# Patient Record
Sex: Male | Born: 1945 | Race: White | Hispanic: No | Marital: Married | Smoking: Former smoker
Health system: Southern US, Community
[De-identification: ages and names within clinical notes are randomized; demographics above are authoritative.]

## PROBLEM LIST (undated history)

## (undated) DIAGNOSIS — M48061 Spinal stenosis, lumbar region without neurogenic claudication: Secondary | ICD-10-CM

## (undated) DIAGNOSIS — R0600 Dyspnea, unspecified: Secondary | ICD-10-CM

## (undated) DIAGNOSIS — J449 Chronic obstructive pulmonary disease, unspecified: Secondary | ICD-10-CM

## (undated) DIAGNOSIS — M109 Gout, unspecified: Secondary | ICD-10-CM

## (undated) DIAGNOSIS — G4733 Obstructive sleep apnea (adult) (pediatric): Secondary | ICD-10-CM

## (undated) DIAGNOSIS — J439 Emphysema, unspecified: Secondary | ICD-10-CM

## (undated) DIAGNOSIS — I2510 Atherosclerotic heart disease of native coronary artery without angina pectoris: Secondary | ICD-10-CM

## (undated) DIAGNOSIS — Z8709 Personal history of other diseases of the respiratory system: Secondary | ICD-10-CM

## (undated) DIAGNOSIS — Z950 Presence of cardiac pacemaker: Secondary | ICD-10-CM

## (undated) DIAGNOSIS — M1990 Unspecified osteoarthritis, unspecified site: Secondary | ICD-10-CM

## (undated) DIAGNOSIS — I499 Cardiac arrhythmia, unspecified: Secondary | ICD-10-CM

## (undated) DIAGNOSIS — K5790 Diverticulosis of intestine, part unspecified, without perforation or abscess without bleeding: Secondary | ICD-10-CM

## (undated) DIAGNOSIS — R7303 Prediabetes: Secondary | ICD-10-CM

## (undated) DIAGNOSIS — I714 Abdominal aortic aneurysm, without rupture, unspecified: Secondary | ICD-10-CM

## (undated) DIAGNOSIS — I509 Heart failure, unspecified: Secondary | ICD-10-CM

## (undated) DIAGNOSIS — I219 Acute myocardial infarction, unspecified: Secondary | ICD-10-CM

## (undated) DIAGNOSIS — E785 Hyperlipidemia, unspecified: Secondary | ICD-10-CM

## (undated) DIAGNOSIS — I10 Essential (primary) hypertension: Secondary | ICD-10-CM

## (undated) DIAGNOSIS — R600 Localized edema: Secondary | ICD-10-CM

## (undated) DIAGNOSIS — N62 Hypertrophy of breast: Secondary | ICD-10-CM

## (undated) HISTORY — DX: Gout, unspecified: M10.9

## (undated) HISTORY — DX: Hypertrophy of breast: N62

## (undated) HISTORY — DX: Atherosclerotic heart disease of native coronary artery without angina pectoris: I25.10

## (undated) HISTORY — DX: Essential (primary) hypertension: I10

## (undated) HISTORY — DX: Emphysema, unspecified: J43.9

## (undated) HISTORY — DX: Diverticulosis of intestine, part unspecified, without perforation or abscess without bleeding: K57.90

## (undated) HISTORY — DX: Hyperlipidemia, unspecified: E78.5

## (undated) HISTORY — DX: Abdominal aortic aneurysm, without rupture, unspecified: I71.40

## (undated) HISTORY — DX: Spinal stenosis, lumbar region without neurogenic claudication: M48.061

## (undated) HISTORY — PX: ABDOMINAL AORTIC ANEURYSM REPAIR: SUR1152

## (undated) HISTORY — DX: Localized edema: R60.0

## (undated) HISTORY — DX: Abdominal aortic aneurysm, without rupture: I71.4

## (undated) HISTORY — DX: Obstructive sleep apnea (adult) (pediatric): G47.33

## (undated) HISTORY — PX: NECK SURGERY: SHX720

## (undated) HISTORY — PX: BACK SURGERY: SHX140

## (undated) HISTORY — DX: Personal history of other diseases of the respiratory system: Z87.09

---

## 1974-02-15 HISTORY — PX: VASECTOMY: SHX75

## 1989-02-15 HISTORY — PX: NECK SURGERY: SHX720

## 1990-02-15 HISTORY — PX: BACK SURGERY: SHX140

## 2009-02-15 HISTORY — PX: ELBOW ARTHROSCOPY: SUR87

## 2016-04-15 HISTORY — PX: CARDIAC CATHETERIZATION: SHX172

## 2016-12-09 DIAGNOSIS — I714 Abdominal aortic aneurysm, without rupture: Secondary | ICD-10-CM | POA: Diagnosis not present

## 2016-12-09 DIAGNOSIS — Z23 Encounter for immunization: Secondary | ICD-10-CM | POA: Diagnosis not present

## 2016-12-09 DIAGNOSIS — E782 Mixed hyperlipidemia: Secondary | ICD-10-CM | POA: Diagnosis not present

## 2016-12-09 DIAGNOSIS — R600 Localized edema: Secondary | ICD-10-CM | POA: Diagnosis not present

## 2016-12-09 DIAGNOSIS — I10 Essential (primary) hypertension: Secondary | ICD-10-CM | POA: Diagnosis not present

## 2016-12-09 DIAGNOSIS — G4733 Obstructive sleep apnea (adult) (pediatric): Secondary | ICD-10-CM | POA: Diagnosis not present

## 2016-12-09 DIAGNOSIS — I2510 Atherosclerotic heart disease of native coronary artery without angina pectoris: Secondary | ICD-10-CM | POA: Diagnosis not present

## 2016-12-15 DIAGNOSIS — S81811A Laceration without foreign body, right lower leg, initial encounter: Secondary | ICD-10-CM | POA: Diagnosis not present

## 2017-01-18 DIAGNOSIS — S81801A Unspecified open wound, right lower leg, initial encounter: Secondary | ICD-10-CM | POA: Diagnosis not present

## 2017-02-01 DIAGNOSIS — I10 Essential (primary) hypertension: Secondary | ICD-10-CM | POA: Diagnosis not present

## 2017-02-01 DIAGNOSIS — I714 Abdominal aortic aneurysm, without rupture: Secondary | ICD-10-CM | POA: Diagnosis not present

## 2017-02-01 DIAGNOSIS — I2510 Atherosclerotic heart disease of native coronary artery without angina pectoris: Secondary | ICD-10-CM | POA: Diagnosis not present

## 2017-02-01 DIAGNOSIS — E782 Mixed hyperlipidemia: Secondary | ICD-10-CM | POA: Diagnosis not present

## 2017-02-01 DIAGNOSIS — Z0000 Encounter for general adult medical examination without abnormal findings: Secondary | ICD-10-CM | POA: Diagnosis not present

## 2017-02-01 DIAGNOSIS — S81801A Unspecified open wound, right lower leg, initial encounter: Secondary | ICD-10-CM | POA: Diagnosis not present

## 2017-02-01 DIAGNOSIS — H6122 Impacted cerumen, left ear: Secondary | ICD-10-CM | POA: Diagnosis not present

## 2017-02-01 DIAGNOSIS — G629 Polyneuropathy, unspecified: Secondary | ICD-10-CM | POA: Diagnosis not present

## 2017-02-01 DIAGNOSIS — G4733 Obstructive sleep apnea (adult) (pediatric): Secondary | ICD-10-CM | POA: Diagnosis not present

## 2017-02-17 DIAGNOSIS — I714 Abdominal aortic aneurysm, without rupture: Secondary | ICD-10-CM | POA: Diagnosis not present

## 2017-02-17 DIAGNOSIS — G4733 Obstructive sleep apnea (adult) (pediatric): Secondary | ICD-10-CM | POA: Diagnosis not present

## 2017-02-17 DIAGNOSIS — E782 Mixed hyperlipidemia: Secondary | ICD-10-CM | POA: Diagnosis not present

## 2017-02-17 DIAGNOSIS — I10 Essential (primary) hypertension: Secondary | ICD-10-CM | POA: Diagnosis not present

## 2017-02-17 DIAGNOSIS — H6122 Impacted cerumen, left ear: Secondary | ICD-10-CM | POA: Diagnosis not present

## 2017-02-17 DIAGNOSIS — I2510 Atherosclerotic heart disease of native coronary artery without angina pectoris: Secondary | ICD-10-CM | POA: Diagnosis not present

## 2017-03-22 DIAGNOSIS — G4733 Obstructive sleep apnea (adult) (pediatric): Secondary | ICD-10-CM | POA: Diagnosis not present

## 2017-03-31 DIAGNOSIS — H2523 Age-related cataract, morgagnian type, bilateral: Secondary | ICD-10-CM | POA: Diagnosis not present

## 2017-04-01 DIAGNOSIS — Z0101 Encounter for examination of eyes and vision with abnormal findings: Secondary | ICD-10-CM | POA: Diagnosis not present

## 2017-04-25 DIAGNOSIS — G4733 Obstructive sleep apnea (adult) (pediatric): Secondary | ICD-10-CM | POA: Diagnosis not present

## 2017-05-04 DIAGNOSIS — G4733 Obstructive sleep apnea (adult) (pediatric): Secondary | ICD-10-CM | POA: Diagnosis not present

## 2017-05-16 ENCOUNTER — Encounter: Payer: Self-pay | Admitting: Vascular Surgery

## 2017-06-04 DIAGNOSIS — G4733 Obstructive sleep apnea (adult) (pediatric): Secondary | ICD-10-CM | POA: Diagnosis not present

## 2017-06-09 DIAGNOSIS — G4733 Obstructive sleep apnea (adult) (pediatric): Secondary | ICD-10-CM | POA: Diagnosis not present

## 2017-06-28 ENCOUNTER — Other Ambulatory Visit: Payer: Self-pay

## 2017-06-28 ENCOUNTER — Ambulatory Visit: Payer: Medicare HMO | Admitting: Vascular Surgery

## 2017-06-28 ENCOUNTER — Encounter: Payer: Self-pay | Admitting: Vascular Surgery

## 2017-06-28 VITALS — BP 155/94 | HR 72 | Temp 98.7°F | Resp 20 | Ht 67.0 in | Wt 238.0 lb

## 2017-06-28 DIAGNOSIS — I71019 Dissection of thoracic aorta, unspecified: Secondary | ICD-10-CM

## 2017-06-28 DIAGNOSIS — I714 Abdominal aortic aneurysm, without rupture, unspecified: Secondary | ICD-10-CM

## 2017-06-28 DIAGNOSIS — I7101 Dissection of thoracic aorta: Secondary | ICD-10-CM

## 2017-06-28 NOTE — Progress Notes (Signed)
Vascular and Vein Specialist of [facility]  Patient name: Jonathan Garner MRN: [id number] DOB: [birth year] Sex: male  REASON FOR CONSULT: Follow-up of infrarenal aortic aneurysm stent graft repair and known thoracic aneurysm.  HPI: Jonathan Garner is a 72 y.o. male, who is patient presents today for follow-up.  He recently moved to [facility] from his entire life in [place].  He is here today with his wife.  I do not have records of his procedure but he had a prior stent graft repair of infrarenal abdominal aortic aneurysm in 2014 by his history.  He had a subsequent repair for an endoleak in 2018.  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  I have copies of his CT scan report of his chest abdomen and pelvis from this event.  He has had no CT scan since March 2018.  He is in good health.  Does remain active.  Does not have any symptoms of his thoracic dissection.  Past Medical History:  Diagnosis Date  . AAA (abdominal aortic aneurysm) ([place])   . CAD (coronary artery disease)   . Diverticulosis   . Gynecomastia   . Hx of emphysema   . Hyperlipidemia   . Hypertension   . Lumbar spinal stenosis   . OSA (obstructive sleep apnea)   . Pedal edema     Family History  Problem Relation Age of Onset  . Alzheimer's disease Mother   . Heart attack Father   . Leukemia Father   . Hypertension Father     SOCIAL HISTORY: Social History   Socioeconomic History  . Marital status: Married    Spouse name: Not on file  . Number of children: Not on file  . Years of education: Not on file  . Highest education level: Not on file  Occupational History  . Not on file  Social Needs  . Financial resource strain: Not on file  . Food insecurity:    Worry: Not on file    Inability: Not on file  . Transportation needs:    Medical: Not on file    Non-medical: Not on  file  Tobacco Use  . Smoking status: Current Some Day Smoker    Types: Cigars  . Smokeless tobacco: Never Used  Substance and Sexual Activity  . Alcohol use: Yes    Comment: occasionally  . Drug use: Not on file  . Sexual activity: Not on file  Lifestyle  . Physical activity:    Days per week: Not on file    Minutes per session: Not on file  . Stress: Not on file  Relationships  . Social connections:    Talks on phone: Not on file    Gets together: Not on file    Attends religious service: Not on file    Active member of club or organization: Not on file    Attends meetings of clubs or organizations: Not on file    Relationship status: Not on file  . Intimate partner violence:    Fear of current or ex partner: Not on file    Emotionally abused: Not on file    Physically abused: Not on file    Forced sexual activity: Not on file  Other Topics Concern  . Not on file  Social History Narrative  . Not on file    No Known Allergies  Current Outpatient Medications  Medication Sig Dispense Refill  . clopidogrel (PLAVIX) 75 MG tablet Take 75 mg by mouth daily.    Marland Kitchen labetalol (NORMODYNE) 200 MG tablet Take 200 mg by mouth 2 (two) times daily.     No current facility-administered medications for this visit.     REVIEW OF SYSTEMS:  [X]  denotes positive finding, [ ]  denotes negative finding Cardiac  Comments:  Chest pain or chest pressure:    Shortness of breath upon exertion: x   Short of breath when lying flat:    Irregular heart rhythm:        Vascular    Pain in calf, thigh, or hip brought on by ambulation: x   Pain in feet at night that wakes you up from your sleep:     Blood clot in your veins:    Leg swelling:  x       Pulmonary    Oxygen at home:    Productive cough:     Wheezing:         Neurologic    Sudden weakness in arms or legs:     Sudden numbness in arms or legs:     Sudden onset of difficulty speaking or slurred speech:    Temporary loss of  vision in one eye:     Problems with dizziness:         Gastrointestinal    Blood in stool:     Vomited blood:         Genitourinary    Burning when urinating:     Blood in urine:        Psychiatric    Major depression:         Hematologic    Bleeding problems:    Problems with blood clotting too easily:        Skin    Rashes or ulcers:        Constitutional    Fever or chills:      PHYSICAL EXAM: Vitals:   06/28/17 1116  BP: (!) 155/94  Pulse: 72  Resp: 20  Temp: 98.7 F (37.1 C)  TempSrc: Oral  SpO2: 95%  Weight: 238 lb (108 kg)  Height: 5\' 7"  (1.702 m)    GENERAL: The patient is a well-nourished male, in no acute distress. The vital signs are documented above. CARDIOVASCULAR: Palpable radial and palpable femoral pulses bilaterally.  He does have easily palpable popliteal and dorsalis pedis pulses.  Carotid arteries without bruits PULMONARY: There is good air exchange  ABDOMEN: Soft and non-tender.  No bruits noted MUSCULOSKELETAL: There are no major deformities or cyanosis. NEUROLOGIC: No focal weakness or paresthesias are detected. SKIN: There are no ulcers or rashes noted. PSYCHIATRIC: The patient has a normal affect.  DATA:  CT scan report from March 2018 showed thoracic dissection beginning just distal to the left subclavian takeoff and extending down to the celiac artery with some compression of the celiac artery.  Maximal dilatation of the thoracic aorta was 4.2 cm.  He did have a infrarenal stent graft repair of infrarenal aneurysm with maximal diameter amp of the aneurysm sac of 6 cm.  MEDICAL ISSUES: Had a long discussion with patient regarding both the thoracic dissection and the abdominal aortic aneurysm and recommended follow-up for each of these.  Is been 14 months since his last CT scan I would obtain a CT of his chest abdomen and pelvis for further visualization.  We will contact him following these imaging studies.  Assuming there  are no  concerns we will see him then in 1 year with repeat CT scanning.   Rosetta Posner, MD FACS Vascular and Vein Specialists of [facility] Tel [phone] Pager [phone]

## 2017-06-29 ENCOUNTER — Other Ambulatory Visit: Payer: Self-pay

## 2017-06-29 DIAGNOSIS — I714 Abdominal aortic aneurysm, without rupture, unspecified: Secondary | ICD-10-CM

## 2017-06-29 DIAGNOSIS — I71019 Dissection of thoracic aorta, unspecified: Secondary | ICD-10-CM

## 2017-06-29 DIAGNOSIS — I7101 Dissection of thoracic aorta: Secondary | ICD-10-CM

## 2017-07-04 DIAGNOSIS — G4733 Obstructive sleep apnea (adult) (pediatric): Secondary | ICD-10-CM | POA: Diagnosis not present

## 2017-07-06 DIAGNOSIS — R0602 Shortness of breath: Secondary | ICD-10-CM | POA: Diagnosis not present

## 2017-07-06 DIAGNOSIS — Z7902 Long term (current) use of antithrombotics/antiplatelets: Secondary | ICD-10-CM | POA: Diagnosis not present

## 2017-07-06 DIAGNOSIS — R7989 Other specified abnormal findings of blood chemistry: Secondary | ICD-10-CM | POA: Diagnosis not present

## 2017-07-06 DIAGNOSIS — R0902 Hypoxemia: Secondary | ICD-10-CM | POA: Diagnosis not present

## 2017-07-06 DIAGNOSIS — I110 Hypertensive heart disease with heart failure: Secondary | ICD-10-CM | POA: Diagnosis not present

## 2017-07-06 DIAGNOSIS — I509 Heart failure, unspecified: Secondary | ICD-10-CM | POA: Diagnosis not present

## 2017-07-06 DIAGNOSIS — I5031 Acute diastolic (congestive) heart failure: Secondary | ICD-10-CM | POA: Diagnosis not present

## 2017-07-06 DIAGNOSIS — I712 Thoracic aortic aneurysm, without rupture: Secondary | ICD-10-CM | POA: Diagnosis not present

## 2017-07-06 DIAGNOSIS — G4733 Obstructive sleep apnea (adult) (pediatric): Secondary | ICD-10-CM | POA: Diagnosis not present

## 2017-07-06 DIAGNOSIS — Z79899 Other long term (current) drug therapy: Secondary | ICD-10-CM | POA: Diagnosis not present

## 2017-07-06 DIAGNOSIS — R0682 Tachypnea, not elsewhere classified: Secondary | ICD-10-CM | POA: Diagnosis not present

## 2017-07-06 DIAGNOSIS — J9601 Acute respiratory failure with hypoxia: Secondary | ICD-10-CM | POA: Diagnosis not present

## 2017-07-06 DIAGNOSIS — R69 Illness, unspecified: Secondary | ICD-10-CM | POA: Diagnosis not present

## 2017-07-06 DIAGNOSIS — R918 Other nonspecific abnormal finding of lung field: Secondary | ICD-10-CM | POA: Diagnosis not present

## 2017-07-06 DIAGNOSIS — I10 Essential (primary) hypertension: Secondary | ICD-10-CM | POA: Diagnosis not present

## 2017-07-06 DIAGNOSIS — R0989 Other specified symptoms and signs involving the circulatory and respiratory systems: Secondary | ICD-10-CM | POA: Diagnosis not present

## 2017-07-06 DIAGNOSIS — I5030 Unspecified diastolic (congestive) heart failure: Secondary | ICD-10-CM | POA: Diagnosis not present

## 2017-07-22 ENCOUNTER — Encounter (HOSPITAL_COMMUNITY): Payer: Self-pay

## 2017-07-22 ENCOUNTER — Ambulatory Visit (HOSPITAL_COMMUNITY)
Admission: RE | Admit: 2017-07-22 | Discharge: 2017-07-22 | Disposition: A | Discharge status: Home / Self Care | Payer: Medicare HMO | Source: Ambulatory Visit | Attending: Vascular Surgery | Admitting: Vascular Surgery

## 2017-07-22 DIAGNOSIS — I7101 Dissection of thoracic aorta: Secondary | ICD-10-CM | POA: Diagnosis present

## 2017-07-22 DIAGNOSIS — I779 Disorder of arteries and arterioles, unspecified: Secondary | ICD-10-CM | POA: Diagnosis not present

## 2017-07-22 DIAGNOSIS — I714 Abdominal aortic aneurysm, without rupture, unspecified: Secondary | ICD-10-CM

## 2017-07-22 DIAGNOSIS — M5136 Other intervertebral disc degeneration, lumbar region: Secondary | ICD-10-CM | POA: Insufficient documentation

## 2017-07-22 DIAGNOSIS — K5730 Diverticulosis of large intestine without perforation or abscess without bleeding: Secondary | ICD-10-CM | POA: Insufficient documentation

## 2017-07-22 DIAGNOSIS — I712 Thoracic aortic aneurysm, without rupture: Secondary | ICD-10-CM | POA: Diagnosis not present

## 2017-07-22 DIAGNOSIS — I723 Aneurysm of iliac artery: Secondary | ICD-10-CM | POA: Insufficient documentation

## 2017-07-22 DIAGNOSIS — I71019 Dissection of thoracic aorta, unspecified: Secondary | ICD-10-CM

## 2017-07-22 DIAGNOSIS — I700 Atherosclerosis of aorta: Secondary | ICD-10-CM | POA: Insufficient documentation

## 2017-07-22 LAB — POCT I-STAT CREATININE: Creatinine, Ser: 1 mg/dL (ref 0.61–1.24)

## 2017-07-22 MED ORDER — IOPAMIDOL (ISOVUE-370) INJECTION 76%
100.0000 mL | Freq: Once | INTRAVENOUS | Status: AC | PRN
  Administered 2017-07-22: 100 mL via INTRAVENOUS

## 2017-07-27 DIAGNOSIS — D171 Benign lipomatous neoplasm of skin and subcutaneous tissue of trunk: Secondary | ICD-10-CM | POA: Diagnosis not present

## 2017-07-27 DIAGNOSIS — I2510 Atherosclerotic heart disease of native coronary artery without angina pectoris: Secondary | ICD-10-CM | POA: Diagnosis not present

## 2017-07-27 DIAGNOSIS — M71329 Other bursal cyst, unspecified elbow: Secondary | ICD-10-CM | POA: Diagnosis not present

## 2017-07-27 DIAGNOSIS — E782 Mixed hyperlipidemia: Secondary | ICD-10-CM | POA: Diagnosis not present

## 2017-07-27 DIAGNOSIS — Z09 Encounter for follow-up examination after completed treatment for conditions other than malignant neoplasm: Secondary | ICD-10-CM | POA: Diagnosis not present

## 2017-07-27 DIAGNOSIS — G4733 Obstructive sleep apnea (adult) (pediatric): Secondary | ICD-10-CM | POA: Diagnosis not present

## 2017-07-27 DIAGNOSIS — I10 Essential (primary) hypertension: Secondary | ICD-10-CM | POA: Diagnosis not present

## 2017-07-27 DIAGNOSIS — I5031 Acute diastolic (congestive) heart failure: Secondary | ICD-10-CM | POA: Diagnosis not present

## 2017-07-27 DIAGNOSIS — I714 Abdominal aortic aneurysm, without rupture: Secondary | ICD-10-CM | POA: Diagnosis not present

## 2017-08-01 DIAGNOSIS — G4733 Obstructive sleep apnea (adult) (pediatric): Secondary | ICD-10-CM | POA: Diagnosis not present

## 2017-08-01 DIAGNOSIS — Z720 Tobacco use: Secondary | ICD-10-CM | POA: Diagnosis not present

## 2017-08-02 ENCOUNTER — Ambulatory Visit: Payer: Medicare HMO | Admitting: Vascular Surgery

## 2017-08-02 ENCOUNTER — Other Ambulatory Visit: Payer: Self-pay

## 2017-08-02 ENCOUNTER — Encounter: Payer: Self-pay | Admitting: Vascular Surgery

## 2017-08-02 VITALS — BP 110/64 | HR 82 | Temp 98.2°F | Resp 20 | Ht 67.0 in | Wt 230.0 lb

## 2017-08-02 DIAGNOSIS — I714 Abdominal aortic aneurysm, without rupture, unspecified: Secondary | ICD-10-CM

## 2017-08-02 DIAGNOSIS — I7101 Dissection of thoracic aorta: Secondary | ICD-10-CM

## 2017-08-02 DIAGNOSIS — I71019 Dissection of thoracic aorta, unspecified: Secondary | ICD-10-CM

## 2017-08-02 NOTE — Progress Notes (Signed)
Vascular and Vein Specialist of [facility]  Patient name: Jonathan Garner MRN: [id number] DOB: 06-Apr-1945 Sex: male  REASON FOR VISIT: Discussion of CT scan chest abdomen and pelvis  HPI: Jonathan Garner is a 72 y.o. male here today for follow-up.  I had seen him in the office has an initial evaluation on 06/28/2017.  He had moved here from [place] and was establishing care.  Had prior history of infrarenal stent graft repair of abdominal aortic aneurysm and also history of type B descending thoracic dissection.  I recommended to CT scan for ongoing follow-up.  He has no new concerns since my visit with him 1 month ago  Past Medical History:  Diagnosis Date  . AAA (abdominal aortic aneurysm) ([place])   . CAD (coronary artery disease)   . Diverticulosis   . Gynecomastia   . Hx of emphysema   . Hyperlipidemia   . Hypertension   . Lumbar spinal stenosis   . OSA (obstructive sleep apnea)   . Pedal edema     Family History  Problem Relation Age of Onset  . Alzheimer's disease Mother   . Heart attack Father   . Leukemia Father   . Hypertension Father     SOCIAL HISTORY: Social History   Tobacco Use  . Smoking status: Current Some Day Smoker    Types: Cigars  . Smokeless tobacco: Never Used  Substance Use Topics  . Alcohol use: Yes    Comment: occasionally    No Known Allergies  Current Outpatient Medications  Medication Sig Dispense Refill  . aspirin EC 81 MG tablet Take 81 mg by mouth daily.    . clopidogrel (PLAVIX) 75 MG tablet Take 75 mg by mouth daily.    . furosemide (LASIX) 20 MG tablet Take 20 mg by mouth daily.  0  . labetalol (NORMODYNE) 200 MG tablet Take 200 mg by mouth 2 (two) times daily.     No current facility-administered medications for this visit.     REVIEW OF SYSTEMS:  [X]  denotes positive finding, [ ]  denotes negative finding Cardiac  Comments:  Chest pain or chest pressure:    Shortness of breath upon  exertion:    Short of breath when lying flat:    Irregular heart rhythm:        Vascular    Pain in calf, thigh, or hip brought on by ambulation:    Pain in feet at night that wakes you up from your sleep:     Blood clot in your veins:    Leg swelling:           PHYSICAL EXAM: Vitals:   08/02/17 1148  BP: 110/64  Pulse: 82  Resp: 20  Temp: 98.2 F (36.8 C)  TempSrc: Oral  SpO2: 97%  Weight: 230 lb (104.3 kg)  Height: 5\' 7"  (1.702 m)    GENERAL: The patient is a well-nourished male, in no acute distress. The vital signs are documented above.  DATA:  I reviewed his CT scan and discussed this at length with the patient.  There are multiple issues regarding this.  He has had marked enlargement in his thoracic aorta.  In 2018 report states that this was 4.2 cm and is currently 5.4 cm.  It does begin distal to the left subclavian takeoff but extends down to the celiac artery.  He does have a stent in his celiac artery that extends into his aorta.  He does not have any evidence of endoleak in  his infrarenal stent graft.  The maximal diameter of his native aortic sac is 5.9 cm and it was described as 6.0 cm in his study in 2018.  Of some concern is that his left iliac limb of his stent graft is just inside the iliac artery past the aortic bifurcation.  This needs to be watched closely but currently does not have any evidence of endoleak associated with this  MEDICAL ISSUES: I had a very long discussion with the patient regarding this explaining that he has had rapid growth of his thoracic aneurysm and I do not feel comfortable with observation only regarding this.  I explained the concern regarding the endpoint at the celiac artery.  Explained that I would review his films with several partners to establish a treatment plan.  Also explained that these sometimes require complex open hybrid repair and also explained the potential for referral to [facility] to Dr. Vinnie Level who  is a regional expert on these complex problems.  We will discuss this further with him following review    Rosetta Posner, MD [facility] Vascular and Vein Specialists of [facility] Tel [phone] Pager [phone]

## 2017-08-04 ENCOUNTER — Encounter: Payer: Self-pay | Admitting: Cardiovascular Disease

## 2017-08-04 ENCOUNTER — Ambulatory Visit: Payer: Medicare HMO | Admitting: Cardiovascular Disease

## 2017-08-04 ENCOUNTER — Encounter: Payer: Self-pay | Admitting: *Deleted

## 2017-08-04 VITALS — BP 116/70 | HR 64 | Ht 67.0 in | Wt 232.0 lb

## 2017-08-04 DIAGNOSIS — I25118 Atherosclerotic heart disease of native coronary artery with other forms of angina pectoris: Secondary | ICD-10-CM

## 2017-08-04 DIAGNOSIS — I714 Abdominal aortic aneurysm, without rupture, unspecified: Secondary | ICD-10-CM

## 2017-08-04 DIAGNOSIS — I5032 Chronic diastolic (congestive) heart failure: Secondary | ICD-10-CM | POA: Diagnosis not present

## 2017-08-04 DIAGNOSIS — I7101 Dissection of thoracic aorta: Secondary | ICD-10-CM | POA: Diagnosis not present

## 2017-08-04 DIAGNOSIS — I10 Essential (primary) hypertension: Secondary | ICD-10-CM | POA: Insufficient documentation

## 2017-08-04 DIAGNOSIS — E785 Hyperlipidemia, unspecified: Secondary | ICD-10-CM | POA: Diagnosis not present

## 2017-08-04 DIAGNOSIS — G4733 Obstructive sleep apnea (adult) (pediatric): Secondary | ICD-10-CM | POA: Diagnosis not present

## 2017-08-04 DIAGNOSIS — Z955 Presence of coronary angioplasty implant and graft: Secondary | ICD-10-CM | POA: Diagnosis not present

## 2017-08-04 DIAGNOSIS — Z9289 Personal history of other medical treatment: Secondary | ICD-10-CM

## 2017-08-04 DIAGNOSIS — I71012 Dissection of descending thoracic aorta: Secondary | ICD-10-CM

## 2017-08-04 NOTE — Patient Instructions (Signed)
Your physician recommends that you schedule a follow-up appointment in: 10 weeks in the [facility] with [place]    Your physician recommends that you continue on your current medications as directed. Please refer to the Current Medication list given to you today.    If you need a refill on your cardiac medications before your next appointment, please call your pharmacy.     No lab work or tests ordered today.     Thank you for choosing [facility] !

## 2017-08-04 NOTE — Progress Notes (Addendum)
CARDIOLOGY CONSULT NOTE  Patient ID: Jonathan Garner MRN: [id number] DOB/AGE: 73-Aug-1947 73 y.o.  Admit date: (Not on file) Primary Physician: Glenford Bayley, DO Referring Physician: Glenford Bayley, DO  Reason for Consultation: Coronary artery disease and CHF  HPI: Jonathan Garner is a 72 y.o. male who is being seen today for the evaluation of coronary artery disease and CHF at the request of Le, Thao P, DO.   I reviewed extensive records.  He reportedly has a history of 3 coronary artery stents placed in [place], [place] in March 2018.  He also reportedly has dyslipidemia, hypertension, and chronic diastolic heart failure.  He has a history of sleep apnea and uses CPAP.  I reviewed labs performed on 07/29/2017: Sodium 140, potassium 4.6, BUN 20, creatinine 0.93, normal liver transaminases, white blood cell 7.3, hemoglobin 13.7, platelets 262.  Past medical history also includes infrarenal stent graft repair of an abdominal aortic aneurysm and also a history of type B descending thoracic dissection.  He recently established with vascular surgery.  He moved here from [facility] where he was born and lived for 35 years.  He now lives in [place].  His wife is also my patient.  He was reportedly hospitalized at [facility] for CHF between May 22 May 24th of this year.  I will have to request records.  He is uncertain about what cardiac testing was performed including echocardiograms and such.  He quit smoking a week ago.  He smoked up to a pack of cigarettes daily for over 50 years.  He denies exertional chest pain.  Gets short of breath if he overexerts himself.  He said overall he feels tired, weak, and achy.  He denies orthopnea, leg swelling, palpitations, and paroxysmal nocturnal dyspnea.     No Known Allergies  Current Outpatient Medications  Medication Sig Dispense Refill  . aspirin EC 81 MG tablet Take 81 mg by mouth daily.    Marland Kitchen atorvastatin (LIPITOR) 10 MG tablet Take 10  mg by mouth every evening.  3  . clopidogrel (PLAVIX) 75 MG tablet Take 75 mg by mouth daily.    . furosemide (LASIX) 20 MG tablet Take 20 mg by mouth daily.  0  . labetalol (NORMODYNE) 200 MG tablet Take 200 mg by mouth 2 (two) times daily.    Marland Kitchen losartan (COZAAR) 50 MG tablet Take 1 tablet by mouth daily.  0   No current facility-administered medications for this visit.     Past Medical History:  Diagnosis Date  . AAA (abdominal aortic aneurysm) ([place])   . CAD (coronary artery disease)   . Diverticulosis   . Gynecomastia   . Hx of emphysema   . Hyperlipidemia   . Hypertension   . Lumbar spinal stenosis   . OSA (obstructive sleep apnea)   . Pedal edema     Past Surgical History:  Procedure Laterality Date  . ABDOMINAL AORTIC ANEURYSM REPAIR  2013 and 2018  . BACK SURGERY    . CARDIAC CATHETERIZATION  04/2016   [place]  . ELBOW ARTHROSCOPY Left 2011  . NECK SURGERY    . VASECTOMY  1976    Social History   Socioeconomic History  . Marital status: Married    Spouse name: Not on file  . Number of children: Not on file  . Years of education: Not on file  . Highest education level: Not on file  Occupational History  . Not on file  Social Needs  . Financial resource strain: Not on file  . Food insecurity:    Worry: Not on file    Inability: Not on file  . Transportation needs:    Medical: Not on file    Non-medical: Not on file  Tobacco Use  . Smoking status: Former Smoker    Types: Cigars    Last attempt to quit: 07/28/2017    Years since quitting: 0.0  . Smokeless tobacco: Never Used  Substance and Sexual Activity  . Alcohol use: Yes    Comment: occasionally  . Drug use: Not on file  . Sexual activity: Not on file  Lifestyle  . Physical activity:    Days per week: Not on file    Minutes per session: Not on file  . Stress: Not on file  Relationships  . Social connections:    Talks on phone: Not on file    Gets together: Not on file    Attends  religious service: Not on file    Active member of club or organization: Not on file    Attends meetings of clubs or organizations: Not on file    Relationship status: Not on file  . Intimate partner violence:    Fear of current or ex partner: Not on file    Emotionally abused: Not on file    Physically abused: Not on file    Forced sexual activity: Not on file  Other Topics Concern  . Not on file  Social History Narrative  . Not on file     No family history of premature CAD in 1st degree relatives.  Current Meds  Medication Sig  . aspirin EC 81 MG tablet Take 81 mg by mouth daily.  Marland Kitchen atorvastatin (LIPITOR) 10 MG tablet Take 10 mg by mouth every evening.  . clopidogrel (PLAVIX) 75 MG tablet Take 75 mg by mouth daily.  . furosemide (LASIX) 20 MG tablet Take 20 mg by mouth daily.  Marland Kitchen labetalol (NORMODYNE) 200 MG tablet Take 200 mg by mouth 2 (two) times daily.  Marland Kitchen losartan (COZAAR) 50 MG tablet Take 1 tablet by mouth daily.      Review of systems complete and found to be negative unless listed above in HPI    Physical exam Blood pressure 116/70, pulse 64, height 5\' 7"  (1.702 m), weight 232 lb (105.2 kg), SpO2 95 %. General: NAD Neck: No JVD, no thyromegaly or thyroid nodule.  Lungs: Poor air movement throughout, diffuse rhonchi. CV: Nondisplaced PMI. Regular rate and rhythm, normal S1/S2, no S3/S4, no murmur.  No peripheral edema.  No carotid bruit.   Abdomen: Soft, nontender, no distention.  Skin: Intact without lesions or rashes.  Neurologic: Alert and oriented x 3.  Psych: Normal affect. Extremities: No clubbing or cyanosis.  HEENT: Normal.   ECG: Most recent ECG reviewed.   Labs: Lab Results  Component Value Date/Time   CREATININE 1.00 07/22/2017 09:15 AM     Lipids: No results found for: LDLCALC, LDLDIRECT, CHOL, TRIG, HDL      ASSESSMENT AND PLAN:  1.  Coronary artery disease with multiple coronary artery stents: Symptomatically stable.  He does not  have stent cards indicating what type of stents or their anatomic location.  I will try to obtain cardiac records from [place], [place].  For the time being I will not make adjustments to medical therapy which includes aspirin 81 mg, Lipitor 10 mg, Plavix 75 mg, labetalol, and losartan.  2.  Chronic diastolic heart  failure: He appears to be euvolemic.  Blood pressure is normal.  As stated above, I will try to obtain cardiac records from both [facility] as well as [facility] where he was recently hospitalized for CHF.  Continue Lasix 20 mg daily along with labetalol and losartan.  3.  Hypertension: Blood pressures controlled.  No changes to therapy.  4.  Hyperlipidemia: I will try to obtain a copy of most recent lipids.  Continue Lipitor 10 mg.  I suspect he will need a higher dose than this given the extent of his vascular disease.  5.  Peripheral vascular disease: History of infrarenal stent graft repair of an abdominal aortic aneurysm and also a history of type B descending thoracic dissection.  Follows with vascular surgery.     Disposition: Follow up in 10 weeks   ADDENDUM: I reviewed extensive documentation from [employer] located in [place], [place].  There is no mention of coronary artery stenting.  I also reviewed extensive hospitalization records including a discharge summary dated 07/06/2017 from [facility] where he was hospitalized for acute diastolic heart failure.  Troponins were 0.01 and 0.04.  BNP was elevated at 1382.  I reviewed the chest x-ray which showed borderline cardiomegaly with vascular congestion and moderate interstitial pulmonary edema with left hilar enlargement.  I personally reviewed an ECG performed on 07/06/2017 which demonstrated sinus tachycardia, 100 bpm, with nonspecific IVCD.  I reviewed an echocardiogram report dated 07/07/2017 which demonstrated normal left ventricular systolic function, LVEF 60 to 65%, moderate concentric LVH,  grade 2 diastolic dysfunction, moderate left atrial dilatation, mild aortic cusp sclerosis, and posterior mitral annular calcification.      Signed: Kate Sable, M.D., F.A.C.C.  08/04/2017, 8:39 AM

## 2017-09-03 DIAGNOSIS — G4733 Obstructive sleep apnea (adult) (pediatric): Secondary | ICD-10-CM | POA: Diagnosis not present

## 2017-09-08 ENCOUNTER — Telehealth: Payer: Self-pay | Admitting: Vascular Surgery

## 2017-09-08 NOTE — Telephone Encounter (Signed)
I spoke with the patient today again regarding his CT scan.  Explained that I reviewed his scan with my partners and the device manufacturer.  Again explained the concern regarding his rapid growth of his thoracic dissection and also the fact that 1 of his iliac limbs was not adequately placed inside the iliac artery of his prior infrarenal stent graft repair.  Have recommended graft repair of his thoracic aorta and extension into his iliac artery.  I explained the procedure with groin access.  Also explained that we would place a spinal cord drain prior to the procedure and that the would be a rare incidence of spinal cord ischemia and and possible paralysis.  We will schedule this at his earliest convenience

## 2017-09-26 DIAGNOSIS — L989 Disorder of the skin and subcutaneous tissue, unspecified: Secondary | ICD-10-CM | POA: Diagnosis not present

## 2017-09-26 DIAGNOSIS — I10 Essential (primary) hypertension: Secondary | ICD-10-CM | POA: Diagnosis not present

## 2017-09-26 DIAGNOSIS — I5031 Acute diastolic (congestive) heart failure: Secondary | ICD-10-CM | POA: Diagnosis not present

## 2017-09-26 DIAGNOSIS — G629 Polyneuropathy, unspecified: Secondary | ICD-10-CM | POA: Diagnosis not present

## 2017-09-26 DIAGNOSIS — R0602 Shortness of breath: Secondary | ICD-10-CM | POA: Diagnosis not present

## 2017-09-26 DIAGNOSIS — E782 Mixed hyperlipidemia: Secondary | ICD-10-CM | POA: Diagnosis not present

## 2017-09-26 DIAGNOSIS — I714 Abdominal aortic aneurysm, without rupture: Secondary | ICD-10-CM | POA: Diagnosis not present

## 2017-09-26 DIAGNOSIS — I2510 Atherosclerotic heart disease of native coronary artery without angina pectoris: Secondary | ICD-10-CM | POA: Diagnosis not present

## 2017-09-27 ENCOUNTER — Other Ambulatory Visit: Payer: Self-pay | Admitting: *Deleted

## 2017-09-27 ENCOUNTER — Telehealth: Payer: Self-pay | Admitting: *Deleted

## 2017-09-28 NOTE — Telephone Encounter (Signed)
Left message for patient to call this office back for pre-op instructions.

## 2017-10-04 ENCOUNTER — Telehealth: Payer: Self-pay | Admitting: *Deleted

## 2017-10-04 DIAGNOSIS — G4733 Obstructive sleep apnea (adult) (pediatric): Secondary | ICD-10-CM | POA: Diagnosis not present

## 2017-10-04 NOTE — Telephone Encounter (Signed)
Patient instructed to be at [facility] hospital admitting department at 6:30 am on 10/26/17 for surgery. NPO pas MN night prior. To continue aspirin. Hold Plavix for 5 days pre-op. Expect a call and follow the detailed instructions received from the hospital pre-admission department for this surgery. Verbalized understanding.

## 2017-10-10 DIAGNOSIS — L819 Disorder of pigmentation, unspecified: Secondary | ICD-10-CM | POA: Diagnosis not present

## 2017-10-10 DIAGNOSIS — L218 Other seborrheic dermatitis: Secondary | ICD-10-CM | POA: Diagnosis not present

## 2017-10-10 DIAGNOSIS — L400 Psoriasis vulgaris: Secondary | ICD-10-CM | POA: Diagnosis not present

## 2017-10-10 DIAGNOSIS — L720 Epidermal cyst: Secondary | ICD-10-CM | POA: Diagnosis not present

## 2017-10-13 ENCOUNTER — Ambulatory Visit: Payer: Medicare HMO | Admitting: Cardiovascular Disease

## 2017-10-13 ENCOUNTER — Other Ambulatory Visit: Payer: Self-pay

## 2017-10-13 ENCOUNTER — Ambulatory Visit (INDEPENDENT_AMBULATORY_CARE_PROVIDER_SITE_OTHER): Payer: Medicare HMO | Admitting: Cardiovascular Disease

## 2017-10-13 ENCOUNTER — Encounter: Payer: Self-pay | Admitting: Cardiovascular Disease

## 2017-10-13 VITALS — BP 122/76 | HR 63 | Ht 67.0 in | Wt 237.0 lb

## 2017-10-13 DIAGNOSIS — E785 Hyperlipidemia, unspecified: Secondary | ICD-10-CM

## 2017-10-13 DIAGNOSIS — I71012 Dissection of descending thoracic aorta: Secondary | ICD-10-CM

## 2017-10-13 DIAGNOSIS — I714 Abdominal aortic aneurysm, without rupture, unspecified: Secondary | ICD-10-CM

## 2017-10-13 DIAGNOSIS — I10 Essential (primary) hypertension: Secondary | ICD-10-CM | POA: Diagnosis not present

## 2017-10-13 DIAGNOSIS — I5032 Chronic diastolic (congestive) heart failure: Secondary | ICD-10-CM | POA: Diagnosis not present

## 2017-10-13 DIAGNOSIS — I7101 Dissection of thoracic aorta: Secondary | ICD-10-CM | POA: Diagnosis not present

## 2017-10-13 NOTE — Progress Notes (Signed)
SUBJECTIVE: The patient presents for routine follow-up.  He was evaluated by vascular surgery and he requires graft repair of his thoracic aorta and extension into his iliac artery.  I initially evaluated him in June 2019.  He told me he has a history of 3 coronary artery stents placed in [place], [place] in March 2018.  He also has chronic diastolic heart failure and sleep apnea.  I was able to obtain records from [facility] located in [place], [place].  There is no mention of coronary artery stenting.  I reviewed an echocardiogram report dated 07/07/2017 which demonstrated normal left ventricular systolic function, LVEF 60 to 65%, moderate concentric LVH, grade 2 diastolic dysfunction, moderate left atrial dilatation, mild aortic cusp sclerosis, and posterior mitral annular calcification.  He now tells me that he did not have any coronary artery stents placed and was referring to his previous infrarenal stent graft repair of an abdominal aortic aneurysm with type B descending thoracic dissection.  He denies chest pain.  He occasionally smokes cigars.  He quit smoking about 2 to 3 years ago and had been smoking a pack of cigarettes daily or less.  He has chronic exertional dyspnea which is stable.  He denies orthopnea and paroxysmal nocturnal dyspnea.  He developed myalgias with statins and stopped them on his own.     Review of Systems: As per "subjective", otherwise negative.  No Known Allergies  Current Outpatient Medications  Medication Sig Dispense Refill  . aspirin EC 81 MG tablet Take 81 mg by mouth daily.    . clopidogrel (PLAVIX) 75 MG tablet Take 75 mg by mouth daily.    . furosemide (LASIX) 20 MG tablet Take 20 mg by mouth daily.  0  . ibuprofen (ADVIL,MOTRIN) 200 MG tablet Take 800 mg by mouth every 8 (eight) hours as needed (FOR PAIN.).    Marland Kitchen ketoconazole (NIZORAL) 2 % cream Apply 1 application topically daily. APPLY BEHIND EARS  0  .  labetalol (NORMODYNE) 200 MG tablet Take 200 mg by mouth 2 (two) times daily.    Marland Kitchen losartan (COZAAR) 50 MG tablet Take 50 mg by mouth daily.   0   No current facility-administered medications for this visit.     Past Medical History:  Diagnosis Date  . AAA (abdominal aortic aneurysm) ([place])   . CAD (coronary artery disease)   . Diverticulosis   . Gynecomastia   . Hx of emphysema   . Hyperlipidemia   . Hypertension   . Lumbar spinal stenosis   . OSA (obstructive sleep apnea)   . Pedal edema     Past Surgical History:  Procedure Laterality Date  . ABDOMINAL AORTIC ANEURYSM REPAIR  2013 and 2018  . BACK SURGERY    . CARDIAC CATHETERIZATION  04/2016   [place], [place]  . ELBOW ARTHROSCOPY Left 2011  . NECK SURGERY    . VASECTOMY  1976    Social History   Socioeconomic History  . Marital status: Married    Spouse name: Not on file  . Number of children: Not on file  . Years of education: Not on file  . Highest education level: Not on file  Occupational History  . Not on file  Social Needs  . Financial resource strain: Not on file  . Food insecurity:    Worry: Not on file    Inability: Not on file  . Transportation needs:    Medical: Not on file  Non-medical: Not on file  Tobacco Use  . Smoking status: Former Smoker    Types: Cigars    Last attempt to quit: 07/28/2017    Years since quitting: 0.2  . Smokeless tobacco: Never Used  Substance and Sexual Activity  . Alcohol use: Yes    Comment: occasionally  . Drug use: Not on file  . Sexual activity: Not on file  Lifestyle  . Physical activity:    Days per week: Not on file    Minutes per session: Not on file  . Stress: Not on file  Relationships  . Social connections:    Talks on phone: Not on file    Gets together: Not on file    Attends religious service: Not on file    Active member of club or organization: Not on file    Attends meetings of clubs or organizations: Not on file    Relationship status: Not on  file  . Intimate partner violence:    Fear of current or ex partner: Not on file    Emotionally abused: Not on file    Physically abused: Not on file    Forced sexual activity: Not on file  Other Topics Concern  . Not on file  Social History Narrative  . Not on file     Vitals:   10/13/17 0930  BP: 122/76  Pulse: 63  SpO2: 98%  Weight: 237 lb (107.5 kg)  Height: 5\' 7"  (1.702 m)    Wt Readings from Last 3 Encounters:  10/13/17 237 lb (107.5 kg)  08/04/17 232 lb (105.2 kg)  08/02/17 230 lb (104.3 kg)     PHYSICAL EXAM General: NAD HEENT: Normal. Neck: No JVD, no thyromegaly. Lungs: Poor air movement with bilateral wheezes and rhonchi. CV: Regular rate and rhythm, normal S1/S2, no S3/S4, no murmur.  Trace bilateral periankle edema.     Abdomen: Soft, nontender, no distention.  Neurologic: Alert and oriented.  Psych: Normal affect. Skin: Normal. Musculoskeletal: No gross deformities.    ECG: Reviewed above under Subjective   Labs: Lab Results  Component Value Date/Time   CREATININE 1.00 07/22/2017 09:15 AM     Lipids: No results found for: LDLCALC, LDLDIRECT, CHOL, TRIG, HDL     ASSESSMENT AND PLAN:   1.  Chronic diastolic heart failure/hypertensive heart disease: He is euvolemic on Lasix 20 mg daily.  Blood pressure is normal.  He has grade 2 diastolic dysfunction with moderate concentric LVH.  2.  Hypertension: Blood pressure is normal.  No changes to therapy.  3.  Hyperlipidemia: He stopped taking statins on his own due to myalgias and joint pains.  4.  Peripheral vascular disease: History of infrarenal stent graft repair of an abdominal aortic aneurysm and also a history of type B descending thoracic dissection.  Follows with vascular surgery.  He has been scheduled for thoracic aortic endovascular stent graft with iliac extensions on 10/26/2017.    Disposition: Follow up 4 months   Kate Sable, M.D., F.A.C.C.

## 2017-10-13 NOTE — Patient Instructions (Signed)
Medication Instructions:  Continue all current medications.  Labwork: none  Testing/Procedures: none  Follow-Up: 4 months   Any Other Special Instructions Will Be Listed Below (If Applicable).  If you need a refill on your cardiac medications before your next appointment, please call your pharmacy.\ 

## 2017-10-19 ENCOUNTER — Other Ambulatory Visit: Payer: Self-pay

## 2017-10-19 ENCOUNTER — Encounter (HOSPITAL_COMMUNITY)
Admission: RE | Admit: 2017-10-19 | Discharge: 2017-10-19 | Disposition: A | Discharge status: Home / Self Care | Payer: Medicare HMO | Source: Ambulatory Visit | Attending: Vascular Surgery | Admitting: Vascular Surgery

## 2017-10-19 ENCOUNTER — Encounter (HOSPITAL_COMMUNITY): Payer: Self-pay

## 2017-10-19 DIAGNOSIS — Z7982 Long term (current) use of aspirin: Secondary | ICD-10-CM | POA: Diagnosis not present

## 2017-10-19 DIAGNOSIS — G4733 Obstructive sleep apnea (adult) (pediatric): Secondary | ICD-10-CM | POA: Insufficient documentation

## 2017-10-19 DIAGNOSIS — E785 Hyperlipidemia, unspecified: Secondary | ICD-10-CM | POA: Diagnosis not present

## 2017-10-19 DIAGNOSIS — I5030 Unspecified diastolic (congestive) heart failure: Secondary | ICD-10-CM | POA: Insufficient documentation

## 2017-10-19 DIAGNOSIS — I739 Peripheral vascular disease, unspecified: Secondary | ICD-10-CM | POA: Insufficient documentation

## 2017-10-19 DIAGNOSIS — Z01810 Encounter for preprocedural cardiovascular examination: Secondary | ICD-10-CM | POA: Diagnosis not present

## 2017-10-19 DIAGNOSIS — Z7902 Long term (current) use of antithrombotics/antiplatelets: Secondary | ICD-10-CM | POA: Insufficient documentation

## 2017-10-19 DIAGNOSIS — Z0183 Encounter for blood typing: Secondary | ICD-10-CM | POA: Diagnosis not present

## 2017-10-19 DIAGNOSIS — J439 Emphysema, unspecified: Secondary | ICD-10-CM | POA: Diagnosis not present

## 2017-10-19 DIAGNOSIS — Z79899 Other long term (current) drug therapy: Secondary | ICD-10-CM | POA: Diagnosis not present

## 2017-10-19 DIAGNOSIS — Z01812 Encounter for preprocedural laboratory examination: Secondary | ICD-10-CM | POA: Insufficient documentation

## 2017-10-19 DIAGNOSIS — M48061 Spinal stenosis, lumbar region without neurogenic claudication: Secondary | ICD-10-CM | POA: Insufficient documentation

## 2017-10-19 DIAGNOSIS — I110 Hypertensive heart disease with heart failure: Secondary | ICD-10-CM | POA: Diagnosis not present

## 2017-10-19 DIAGNOSIS — I712 Thoracic aortic aneurysm, without rupture: Secondary | ICD-10-CM | POA: Diagnosis not present

## 2017-10-19 DIAGNOSIS — I2510 Atherosclerotic heart disease of native coronary artery without angina pectoris: Secondary | ICD-10-CM | POA: Diagnosis not present

## 2017-10-19 DIAGNOSIS — Z87891 Personal history of nicotine dependence: Secondary | ICD-10-CM | POA: Insufficient documentation

## 2017-10-19 HISTORY — DX: Heart failure, unspecified: I50.9

## 2017-10-19 HISTORY — DX: Dyspnea, unspecified: R06.00

## 2017-10-19 LAB — COMPREHENSIVE METABOLIC PANEL
ALT: 15 U/L (ref 0–44)
AST: 20 U/L (ref 15–41)
Albumin: 3.8 g/dL (ref 3.5–5.0)
Alkaline Phosphatase: 84 U/L (ref 38–126)
Anion gap: 11 (ref 5–15)
BUN: 13 mg/dL (ref 8–23)
CO2: 23 mmol/L (ref 22–32)
Calcium: 9 mg/dL (ref 8.9–10.3)
Chloride: 106 mmol/L (ref 98–111)
Creatinine, Ser: 1.06 mg/dL (ref 0.61–1.24)
GFR calc Af Amer: >60 mL/min (ref >60)
GFR calc non Af Amer: >60 mL/min (ref >60)
Glucose, Bld: 100 mg/dL — ABNORMAL HIGH (ref 70–99)
Potassium: 4.1 mmol/L (ref 3.5–5.1)
Sodium: 140 mmol/L (ref 135–145)
Total Bilirubin: 0.9 mg/dL (ref 0.3–1.2)
Total Protein: 6.7 g/dL (ref 6.5–8.1)

## 2017-10-19 LAB — TYPE AND SCREEN
ABO/RH(D): O POS
Antibody Screen: NEGATIVE

## 2017-10-19 LAB — URINALYSIS, ROUTINE W REFLEX MICROSCOPIC
Bacteria, UA: NONE SEEN
Bilirubin Urine: NEGATIVE
Glucose, UA: NEGATIVE mg/dL
Ketones, ur: NEGATIVE mg/dL
Leukocytes, UA: NEGATIVE
Nitrite: NEGATIVE
Protein, ur: NEGATIVE mg/dL
Specific Gravity, Urine: 1.005 (ref 1.005–1.030)
pH: 6 (ref 5.0–8.0)

## 2017-10-19 LAB — PROTIME-INR
INR: 1.01
Prothrombin Time: 13.2 seconds (ref 11.4–15.2)

## 2017-10-19 LAB — CBC
HCT: 44 % (ref 39.0–52.0)
Hemoglobin: 14 g/dL (ref 13.0–17.0)
MCH: 29.3 pg (ref 26.0–34.0)
MCHC: 31.8 g/dL (ref 30.0–36.0)
MCV: 92.1 fL (ref 78.0–100.0)
Platelets: 246 10*3/uL (ref 150–400)
RBC: 4.78 MIL/uL (ref 4.22–5.81)
RDW: 13.7 % (ref 11.5–15.5)
WBC: 7 10*3/uL (ref 4.0–10.5)

## 2017-10-19 LAB — SURGICAL PCR SCREEN
MRSA, PCR: NEGATIVE
Staphylococcus aureus: NEGATIVE

## 2017-10-19 LAB — APTT: aPTT: 38 seconds — ABNORMAL HIGH (ref 24–36)

## 2017-10-19 LAB — ABO/RH: ABO/RH(D): O POS

## 2017-10-19 NOTE — Pre-Procedure Instructions (Addendum)
Jonathan Garner  10/19/2017      [facility] [address], [place] [address] Phone: [phone] Fax: [phone]    Your procedure is scheduled on   Wednesday  10/26/17  Report to [facility] Admitting at 630 A.M.  Call this number if you have problems the morning of surgery:  [phone]   Remember:  Do not eat or drink after midnight.     Take these medicines the morning of surgery with A SIP OF WATER -  LABETALOL (NORMODYNE)  7 days prior to surgery STOP taking any Aleve, Naproxen, Ibuprofen, Motrin, Advil, Goody's, BC's, all herbal medications, fish oil, and all vitamins STOP PLAVIX 5 DAYS PRIOR TO SURGERY    Do not wear jewelry, make-up or nail polish.  Do not wear lotions, powders, or perfumes, or deodorant.  Do not shave 48 hours prior to surgery.  Men may shave face and neck.  Do not bring valuables to the hospital.  [facility] is not responsible for any belongings or valuables.  Contacts, dentures or bridgework may not be worn into surgery.  Leave your suitcase in the car.  After surgery it may be brought to your room.  For patients admitted to the hospital, discharge time will be determined by your treatment team.  Patients discharged the day of surgery will not be allowed to drive home.   Name and phone number of your driver:    Special instructions:  [facility] - Preparing for Surgery  Before surgery, you can play an important role.  Because skin is not sterile, your skin needs to be as free of germs as possible.  You can reduce the number of germs on you skin by washing with CHG (chlorahexidine gluconate) soap before surgery.  CHG is an antiseptic cleaner which kills germs and bonds with the skin to continue killing germs even after washing.  Oral Hygiene is also important in reducing the risk of infection.  Remember to brush your teeth with your regular toothpaste the morning of surgery.  Please DO NOT use  if you have an allergy to CHG or antibacterial soaps.  If your skin becomes reddened/irritated stop using the CHG and inform your nurse when you arrive at Short Stay.  Do not shave (including legs and underarms) for at least 48 hours prior to the first CHG shower.  You may shave your face.  Please follow these instructions carefully:   1.  Shower with CHG Soap the night before surgery and the morning of Surgery.  2.  If you choose to wash your hair, wash your hair first as usual with your normal shampoo.  3.  After you shampoo, rinse your hair and body thoroughly to remove the shampoo. 4.  Use CHG as you would any other liquid soap.  You can apply chg directly to the skin and wash gently with a      scrungie or washcloth.           5.  Apply the CHG Soap to your body ONLY FROM THE NECK DOWN.   Do not use on open wounds or open sores. Avoid contact with your eyes, ears, mouth and genitals (private parts).  Wash genitals (private parts) with your normal soap.  6.  Wash thoroughly, paying special attention to the area where your surgery will be performed.  7.  Thoroughly rinse your body with warm water from the neck down.  8.  DO NOT shower/wash with your normal soap after using and rinsing off the CHG Soap.  9.  Pat yourself dry with a clean towel.            10.  Wear clean pajamas.            11.  Place clean sheets on your bed the night of your first shower and do not sleep with pets.  Day of Surgery  Do not apply any lotions/deoderants the morning of surgery.   Please wear clean clothes to the hospital/surgery center. Remember to brush your teeth with toothpaste.     Please read over the following fact sheets that you were given. Pain Booklet, Coughing and Deep Breathing, MRSA Information and Surgical Site Infection Prevention

## 2017-10-19 NOTE — Progress Notes (Signed)
PATIENT STATED THAT HE WAS INSTRUCTED TO STOP PLAVIX 5 DAYS PRIOR TO SURGERY. LAST DOSE WILL BE 10/20/17.  WILL CALL DR. Court Garner OFFICE TO SEE IF WE CAN GET COPY OF CARDIAC RECORDS FROM [place].

## 2017-10-20 NOTE — Progress Notes (Signed)
Anesthesia Chart Review:  Case:  [id number] Date/Time:  10/26/17 0815   Procedure:  THORACIC AORTIC ENDOVASCULAR STENT GRAFT WITH ILIAC EXTENSIONS (N/A ) - PATIENT WILL NEED SPINAL CORD DRAIN BY ANESTHESIA   Anesthesia type:  General   Pre-op diagnosis:  THORACIC AORTIC ANEURYSM   Location:  [facility] OR ROOM 85 / [facility] OR   Surgeon:  Rosetta Posner, MD      DISCUSSION:72 yo male former smoker for above procedure. Pertinent hx includes HTN, OSA, PVD, Diastolic heart failure/hypertensive heart disease (EF 60-65%, grade 2 dd by Echo 2019), DOE, Emphysema.  Pt was seen by his cardiologist Dr. Bronson Ing 10/13/17. At that time it was discussed pt would be having aortic endovascular stenting. Per notes the pt was euvolemic and stable from cardiac standpoint.   Per surgery posting pt will require spinal cord drain by anesthesia.  Pt stated he was advised to stop Plavix 5d prior to surgery, LD 10/20/17.  Anticipate he can proceed with surgery as planned barring acute status change.  VS: BP 137/77   Pulse 70   Temp 36.9 C   Resp 20   Ht 5\' 7"  (1.702 m)   Wt 108.4 kg   SpO2 98%   BMI 37.42 kg/m   PROVIDERS: Glenford Bayley, DO is PCP  Kate Sable, MD is Cardiologist  LABS: Labs reviewed: Acceptable for surgery. (all labs ordered are listed, but only abnormal results are displayed)  Labs Reviewed  APTT - Abnormal; Notable for the following components:      Result Value   aPTT 38 (*)    All other components within normal limits  COMPREHENSIVE METABOLIC PANEL - Abnormal; Notable for the following components:   Glucose, Bld 100 (*)    All other components within normal limits  URINALYSIS, ROUTINE W REFLEX MICROSCOPIC - Abnormal; Notable for the following components:   Color, Urine STRAW (*)    Hgb urine dipstick SMALL (*)    All other components within normal limits  SURGICAL PCR SCREEN  CBC  PROTIME-INR  TYPE AND SCREEN  ABO/RH     IMAGES: CT 07/22/2017  IMPRESSION: CTA CHEST  1.  Descending thoracic aortic aneurysm measuring up to 5.4 cm in transverse diameter. 2. 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 3. Heterogeneous, irregular and likely ulcerated atherosclerotic plaque throughout the transverse and descending thoracic aorta.  CTA ABD/PELVIS  1. Hyperenhancement along the right posterolateral aspect of the prostate gland raises the possibility of underlying prostate adenocarcinoma. Recommend clinical correlation with serum PSA. 2. Surgical changes of prior EVAR of a fusiform infrarenal abdominal aortic aneurysm. No evidence of endoleak or other complicating feature. The excluded aneurysm sac measures a maximum of 5.9 cm in diameter on the sagittal reformatted images. 3. 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 4. Left common iliac artery ectasia with a maximal diameter of 1.7 cm. 5. Widely patent stent in the origin of the celiac axis. 6. No acute abnormality within the abdomen or pelvis. 7. Colonic diverticular disease without CT evidence of active inflammation. 8. Advanced mid and lower lumbar degenerative facet arthropathy.  EKG: 10/19/2017: Sinus bradycardia. Inferior infarct , age undetermined   CV: TTE 07/07/17 (outside record, copy on pt chart): Left ventricle: The left ventricular chamber size is normal.  Moderate concentric left ventricular hypertrophy  is observed.  Global left ventricular wall motion and contractility within normal limits.  There is normal left ventricular systolic function.  The estimated ejection fraction of 60 to 65%.  Abnormal left ventricular diastolic function is observed.  Left ventricular diastolic filling pattern is consistent with pseudonormalization grade 2 diastolic dysfunction.  Left  ventricular diastolic filling pattern is consistent with elevated mean left atrial pressure.  Left atrium: Left atrium is mildly to moderately dilated.  Right ventricle: The right ventricular cavity size is normal.  The right ventricular global systolic function is normal.  Right atrium: The right atrial cavity size is normal.  Aortic valve: The aortic valve is trileaflet.  Mild aortic cusp sclerosis is present.  There is no evidence of aortic regurgitation.  There is no evidence of aortic stenosis.  Mitral valve: Mitral valve leaflets appear normal.  There is posterior mitral annular calcification.  There is a trace mitral regurgitation.  Tricuspid valve: The tricuspid valve leaflets are normal.  There is a physiological tricuspid regurgitation.  Unable to estimate the right ventricular systolic pressure.  Pulmonic valve: Pulmonic valve appears normal.  Pericardium: Pericardium appears normal.  There is no pericardial effusion.  Aorta: There is no dilatation of the ascending aorta.  Pulmonary artery: The main pulmonary artery appears normal.  Venous: The inferior vena cava appears normal.  The inferior vena cava appears normal in size.  Conclusions: 1.  Moderate concentric left ventricular hypertrophy is observed. 2.  There is normal left ventricular systolic function. 3.  The estimated ejection fraction 60 to 65%. 4.  The left ventricular diastolic filling pattern consistent with pseudonormalization grade 2 diastolic dysfunction. 5.  Left atrium is mild to moderately dilated. 6.  The right ventricular global systolic function is normal. 7.  Mild aortic cusp sclerosis is present. 8.  There is posterior mitral annular calcification.  Past Medical History:  Diagnosis Date  . AAA (abdominal aortic aneurysm) ([place])   . CAD (coronary artery disease)   . CHF (congestive heart failure) ([place])   . Diverticulosis   . Dyspnea    W/ EXERTION   . Gynecomastia   . Hx of emphysema   .  Hyperlipidemia   . Hypertension   . Lumbar spinal stenosis   . OSA (obstructive sleep apnea)   . Pedal edema     Past Surgical History:  Procedure Laterality Date  . ABDOMINAL AORTIC ANEURYSM REPAIR  2013 and 2018  . BACK SURGERY    . CARDIAC CATHETERIZATION  04/2016   [place], [place]  . ELBOW ARTHROSCOPY Left 2011  . NECK SURGERY    . VASECTOMY  1976    MEDICATIONS: . aspirin EC 81 MG tablet  . clopidogrel (PLAVIX) 75 MG tablet  . furosemide (LASIX) 20 MG tablet  . ibuprofen (ADVIL,MOTRIN) 200 MG tablet  . ketoconazole (NIZORAL) 2 % cream  . labetalol (NORMODYNE) 200 MG tablet  . losartan (COZAAR) 50 MG tablet   No current facility-administered medications for this encounter.       Wynonia Musty [facility] Short Stay Center/Anesthesiology Phone [phone] 10/20/2017 2:03 PM

## 2017-10-25 NOTE — Anesthesia Preprocedure Evaluation (Addendum)
Anesthesia Evaluation  Patient identified by MRN, date of birth, ID band Patient awake    Reviewed: Allergy & Precautions, NPO status , Patient's Chart, lab work & pertinent test results, reviewed documented beta blocker date and time   Airway Mallampati: II  TM Distance: >3 FB Neck ROM: Full    Dental  (+) Upper Dentures, Partial Lower   Pulmonary sleep apnea and Continuous Positive Airway Pressure Ventilation , former smoker,    Pulmonary exam normal breath sounds clear to auscultation       Cardiovascular hypertension, Pt. on medications and Pt. on home beta blockers + CAD, + Cardiac Stents (x 3 ) and +CHF  Normal cardiovascular exam Rhythm:Regular Rate:Normal  ECG: SB, rate 54  CTA Chest 1. Descending thoracic aortic aneurysm measuring up to 5.4 cm in transverse diameter. 2. 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 3. Heterogeneous, irregular and likely ulcerated atherosclerotic plaque throughout the transverse and descending thoracic aorta.  Pt was seen by his cardiologist Dr. Bronson Ing 10/13/17. At that time it was discussed pt would be having aortic endovascular stenting. Per notes the pt was euvolemic and stable from cardiac standpoint.   ECHO: Conclusions: 1.  Moderate concentric left ventricular hypertrophy is observed. 2.  There is normal left ventricular systolic function. 3.  The estimated ejection fraction 60 to 65%. 4.  The left ventricular diastolic filling pattern consistent with pseudonormalization grade 2 diastolic dysfunction. 5.  Left atrium is mild to moderately dilated. 6.  The right ventricular global systolic function is normal. 7.  Mild aortic cusp sclerosis is present. 8.  There is posterior mitral  annular calcification.   Neuro/Psych negative neurological ROS  negative psych ROS   GI/Hepatic negative GI ROS, Neg liver ROS,   Endo/Other  negative endocrine ROS  Renal/GU negative Renal ROS     Musculoskeletal negative musculoskeletal ROS (+)   Abdominal (+) + obese,   Peds  Hematology HLD   Anesthesia Other Findings THORACIC AORTIC ANEURYSM  Reproductive/Obstetrics                            Anesthesia Physical Anesthesia Plan  ASA: IV  Anesthesia Plan: General   Post-op Pain Management:    Induction: Intravenous  PONV Risk Score and Plan: 2 and Ondansetron, Dexamethasone, Midazolam and Treatment may vary due to age or medical condition  Airway Management Planned: Oral ETT  Additional Equipment: Arterial line, CVP, Ultrasound Guidance Line Placement and Spinal Drain  Intra-op Plan:   Post-operative Plan: Extubation in OR  Informed Consent: I have reviewed the patients History and Physical, chart, labs and discussed the procedure including the risks, benefits and alternatives for the proposed anesthesia with the patient or authorized representative who has indicated his/her understanding and acceptance.   Dental advisory given  Plan Discussed with: CRNA  Anesthesia Plan Comments:         Anesthesia Quick Evaluation

## 2017-10-26 ENCOUNTER — Other Ambulatory Visit: Payer: Self-pay

## 2017-10-26 ENCOUNTER — Encounter (HOSPITAL_COMMUNITY)
Admission: RE | Disposition: A | Discharge status: Home / Self Care | Payer: Self-pay | Source: Home / Self Care | Attending: Vascular Surgery

## 2017-10-26 ENCOUNTER — Inpatient Hospital Stay (HOSPITAL_COMMUNITY): Payer: Medicare HMO | Admitting: Certified Registered"

## 2017-10-26 ENCOUNTER — Inpatient Hospital Stay (HOSPITAL_COMMUNITY)
Admission: RE | Admit: 2017-10-26 | Discharge: 2017-10-28 | DRG: 221 | Disposition: A | Discharge status: Home / Self Care | Payer: Medicare HMO | Attending: Vascular Surgery | Admitting: Vascular Surgery

## 2017-10-26 ENCOUNTER — Inpatient Hospital Stay (HOSPITAL_COMMUNITY): Payer: Medicare HMO | Admitting: Physician Assistant

## 2017-10-26 ENCOUNTER — Inpatient Hospital Stay (HOSPITAL_COMMUNITY): Payer: Medicare HMO

## 2017-10-26 ENCOUNTER — Encounter (HOSPITAL_COMMUNITY): Payer: Self-pay | Admitting: *Deleted

## 2017-10-26 DIAGNOSIS — R69 Illness, unspecified: Secondary | ICD-10-CM | POA: Diagnosis not present

## 2017-10-26 DIAGNOSIS — E785 Hyperlipidemia, unspecified: Secondary | ICD-10-CM | POA: Diagnosis not present

## 2017-10-26 DIAGNOSIS — I2510 Atherosclerotic heart disease of native coronary artery without angina pectoris: Secondary | ICD-10-CM | POA: Diagnosis not present

## 2017-10-26 DIAGNOSIS — E669 Obesity, unspecified: Secondary | ICD-10-CM | POA: Diagnosis not present

## 2017-10-26 DIAGNOSIS — I509 Heart failure, unspecified: Secondary | ICD-10-CM | POA: Diagnosis present

## 2017-10-26 DIAGNOSIS — Z955 Presence of coronary angioplasty implant and graft: Secondary | ICD-10-CM | POA: Diagnosis not present

## 2017-10-26 DIAGNOSIS — I711 Thoracic aortic aneurysm, ruptured: Secondary | ICD-10-CM | POA: Diagnosis not present

## 2017-10-26 DIAGNOSIS — Z7902 Long term (current) use of antithrombotics/antiplatelets: Secondary | ICD-10-CM | POA: Diagnosis not present

## 2017-10-26 DIAGNOSIS — Z8249 Family history of ischemic heart disease and other diseases of the circulatory system: Secondary | ICD-10-CM | POA: Diagnosis not present

## 2017-10-26 DIAGNOSIS — F17290 Nicotine dependence, other tobacco product, uncomplicated: Secondary | ICD-10-CM | POA: Diagnosis present

## 2017-10-26 DIAGNOSIS — G4733 Obstructive sleep apnea (adult) (pediatric): Secondary | ICD-10-CM | POA: Diagnosis not present

## 2017-10-26 DIAGNOSIS — Z79899 Other long term (current) drug therapy: Secondary | ICD-10-CM

## 2017-10-26 DIAGNOSIS — Z6837 Body mass index (BMI) 37.0-37.9, adult: Secondary | ICD-10-CM

## 2017-10-26 DIAGNOSIS — I110 Hypertensive heart disease with heart failure: Secondary | ICD-10-CM | POA: Diagnosis present

## 2017-10-26 DIAGNOSIS — G8918 Other acute postprocedural pain: Secondary | ICD-10-CM | POA: Diagnosis not present

## 2017-10-26 DIAGNOSIS — J439 Emphysema, unspecified: Secondary | ICD-10-CM | POA: Diagnosis present

## 2017-10-26 DIAGNOSIS — I712 Thoracic aortic aneurysm, without rupture, unspecified: Secondary | ICD-10-CM

## 2017-10-26 DIAGNOSIS — Z7982 Long term (current) use of aspirin: Secondary | ICD-10-CM | POA: Diagnosis not present

## 2017-10-26 DIAGNOSIS — I10 Essential (primary) hypertension: Secondary | ICD-10-CM | POA: Diagnosis not present

## 2017-10-26 DIAGNOSIS — Z9889 Other specified postprocedural states: Secondary | ICD-10-CM

## 2017-10-26 DIAGNOSIS — Z8679 Personal history of other diseases of the circulatory system: Secondary | ICD-10-CM

## 2017-10-26 HISTORY — PX: THORACIC AORTIC ENDOVASCULAR STENT GRAFT: SHX6112

## 2017-10-26 LAB — PROTIME-INR
INR: 1.04
Prothrombin Time: 13.5 seconds (ref 11.4–15.2)

## 2017-10-26 LAB — CBC
HCT: 40.5 % (ref 39.0–52.0)
Hemoglobin: 13.3 g/dL (ref 13.0–17.0)
MCH: 29.4 pg (ref 26.0–34.0)
MCHC: 32.8 g/dL (ref 30.0–36.0)
MCV: 89.4 fL (ref 78.0–100.0)
Platelets: 213 10*3/uL (ref 150–400)
RBC: 4.53 MIL/uL (ref 4.22–5.81)
RDW: 13.4 % (ref 11.5–15.5)
WBC: 9.4 10*3/uL (ref 4.0–10.5)

## 2017-10-26 LAB — BASIC METABOLIC PANEL
Anion gap: 8 (ref 5–15)
BUN: 12 mg/dL (ref 8–23)
CO2: 23 mmol/L (ref 22–32)
Calcium: 8.9 mg/dL (ref 8.9–10.3)
Chloride: 108 mmol/L (ref 98–111)
Creatinine, Ser: 0.89 mg/dL (ref 0.61–1.24)
GFR calc Af Amer: >60 mL/min (ref >60)
GFR calc non Af Amer: >60 mL/min (ref >60)
Glucose, Bld: 142 mg/dL — ABNORMAL HIGH (ref 70–99)
Potassium: 4.2 mmol/L (ref 3.5–5.1)
Sodium: 139 mmol/L (ref 135–145)

## 2017-10-26 LAB — APTT
aPTT: 25 seconds (ref 24–36)
aPTT: 39 seconds — ABNORMAL HIGH (ref 24–36)

## 2017-10-26 LAB — MAGNESIUM: Magnesium: 2.1 mg/dL (ref 1.7–2.4)

## 2017-10-26 LAB — POCT ACTIVATED CLOTTING TIME
Activated Clotting Time: 186 seconds
Activated Clotting Time: 120 seconds

## 2017-10-26 SURGERY — INSERTION, ENDOVASCULAR STENT GRAFT, AORTA, THORACIC
Anesthesia: General

## 2017-10-26 MED ORDER — ACETAMINOPHEN 325 MG PO TABS
325.0000 mg | ORAL_TABLET | ORAL | Status: DC | PRN
  Administered 2017-10-26: 650 mg via ORAL
  Filled 2017-10-26: qty 2

## 2017-10-26 MED ORDER — HEPARIN SODIUM (PORCINE) 1000 UNIT/ML IJ SOLN
INTRAMUSCULAR | Status: DC | PRN
  Administered 2017-10-26: 9000 [IU] via INTRAVENOUS

## 2017-10-26 MED ORDER — POLYETHYLENE GLYCOL 3350 17 G PO PACK: 17.0000 g | PACK | Freq: Every day | ORAL | Status: DC | PRN

## 2017-10-26 MED ORDER — ARTIFICIAL TEARS OPHTHALMIC OINT
TOPICAL_OINTMENT | OPHTHALMIC | Status: AC
  Filled 2017-10-26: qty 3.5

## 2017-10-26 MED ORDER — SODIUM CHLORIDE 0.9 % IV SOLN
INTRAVENOUS | Status: DC | PRN
  Administered 2017-10-26: 25 ug/min via INTRAVENOUS

## 2017-10-26 MED ORDER — SODIUM CHLORIDE 0.9 % IV SOLN
INTRAVENOUS | Status: AC
  Filled 2017-10-26 (×2): qty 1.2

## 2017-10-26 MED ORDER — DOCUSATE SODIUM 100 MG PO CAPS
100.0000 mg | ORAL_CAPSULE | Freq: Every day | ORAL | Status: DC
  Filled 2017-10-26: qty 1

## 2017-10-26 MED ORDER — FENTANYL CITRATE (PF) 100 MCG/2ML IJ SOLN
INTRAMUSCULAR | Status: DC | PRN
  Administered 2017-10-26: 25 ug via INTRAVENOUS
  Administered 2017-10-26: 50 ug via INTRAVENOUS
  Administered 2017-10-26: 75 ug via INTRAVENOUS
  Administered 2017-10-26 (×2): 25 ug via INTRAVENOUS

## 2017-10-26 MED ORDER — MAGNESIUM SULFATE 2 GM/50ML IV SOLN: 2.0000 g | Freq: Once | INTRAVENOUS | Status: DC | PRN

## 2017-10-26 MED ORDER — SODIUM CHLORIDE 0.9 % IV SOLN: INTRAVENOUS | Status: DC

## 2017-10-26 MED ORDER — CHLORHEXIDINE GLUCONATE 4 % EX LIQD: 60.0000 mL | Freq: Once | CUTANEOUS | Status: DC

## 2017-10-26 MED ORDER — MORPHINE SULFATE (PF) 2 MG/ML IV SOLN: 2.0000 mg | INTRAVENOUS | Status: DC | PRN

## 2017-10-26 MED ORDER — FENTANYL CITRATE (PF) 100 MCG/2ML IJ SOLN
25.0000 ug | INTRAMUSCULAR | Status: DC | PRN
  Administered 2017-10-26: 50 ug via INTRAVENOUS

## 2017-10-26 MED ORDER — ONDANSETRON HCL 4 MG/2ML IJ SOLN
INTRAMUSCULAR | Status: DC | PRN
  Administered 2017-10-26: 4 mg via INTRAVENOUS

## 2017-10-26 MED ORDER — DEXTROSE-NACL 5-0.45 % IV SOLN
INTRAVENOUS | Status: DC
  Administered 2017-10-26 – 2017-10-27 (×6): via INTRAVENOUS

## 2017-10-26 MED ORDER — IODIXANOL 320 MG/ML IV SOLN
INTRAVENOUS | Status: DC | PRN
  Administered 2017-10-26: 150 mL via INTRAVENOUS

## 2017-10-26 MED ORDER — 0.9 % SODIUM CHLORIDE (POUR BTL) OPTIME
TOPICAL | Status: DC | PRN
  Administered 2017-10-26: 1000 mL

## 2017-10-26 MED ORDER — CEFAZOLIN SODIUM-DEXTROSE 2-4 GM/100ML-% IV SOLN
2.0000 g | Freq: Three times a day (TID) | INTRAVENOUS | Status: AC
  Administered 2017-10-26 – 2017-10-27 (×2): 2 g via INTRAVENOUS
  Filled 2017-10-26 (×2): qty 100

## 2017-10-26 MED ORDER — PROPOFOL 10 MG/ML IV BOLUS
INTRAVENOUS | Status: DC | PRN
  Administered 2017-10-26: 20 mg via INTRAVENOUS
  Administered 2017-10-26: 70 mg via INTRAVENOUS
  Administered 2017-10-26: 40 mg via INTRAVENOUS
  Administered 2017-10-26: 30 mg via INTRAVENOUS

## 2017-10-26 MED ORDER — LABETALOL HCL 5 MG/ML IV SOLN
10.0000 mg | INTRAVENOUS | Status: DC | PRN
  Administered 2017-10-27 – 2017-10-28 (×3): 10 mg via INTRAVENOUS
  Filled 2017-10-26 (×3): qty 4

## 2017-10-26 MED ORDER — HYDRALAZINE HCL 20 MG/ML IJ SOLN
INTRAMUSCULAR | Status: AC
  Filled 2017-10-26: qty 1

## 2017-10-26 MED ORDER — PHENOL 1.4 % MT LIQD: 1.0000 | OROMUCOSAL | Status: DC | PRN

## 2017-10-26 MED ORDER — ALUM & MAG HYDROXIDE-SIMETH 200-200-20 MG/5ML PO SUSP
15.0000 mL | ORAL | Status: DC | PRN
  Administered 2017-10-27: 30 mL via ORAL
  Filled 2017-10-26: qty 30

## 2017-10-26 MED ORDER — LIDOCAINE 2% (20 MG/ML) 5 ML SYRINGE
INTRAMUSCULAR | Status: DC | PRN
  Administered 2017-10-26: 100 mg via INTRAVENOUS

## 2017-10-26 MED ORDER — LACTATED RINGERS IV SOLN
INTRAVENOUS | Status: DC | PRN
  Administered 2017-10-26: 08:00:00 via INTRAVENOUS

## 2017-10-26 MED ORDER — DEXAMETHASONE SODIUM PHOSPHATE 10 MG/ML IJ SOLN
INTRAMUSCULAR | Status: DC | PRN
  Administered 2017-10-26: 5 mg via INTRAVENOUS

## 2017-10-26 MED ORDER — LIDOCAINE 2% (20 MG/ML) 5 ML SYRINGE
INTRAMUSCULAR | Status: AC
  Filled 2017-10-26: qty 5

## 2017-10-26 MED ORDER — SODIUM CHLORIDE 0.9 % IV SOLN
INTRAVENOUS | Status: DC | PRN
  Administered 2017-10-26: 09:00:00

## 2017-10-26 MED ORDER — MIDAZOLAM HCL 2 MG/2ML IJ SOLN
INTRAMUSCULAR | Status: AC
  Filled 2017-10-26: qty 2

## 2017-10-26 MED ORDER — PROTAMINE SULFATE 10 MG/ML IV SOLN
INTRAVENOUS | Status: AC
  Filled 2017-10-26: qty 25

## 2017-10-26 MED ORDER — ROCURONIUM BROMIDE 50 MG/5ML IV SOSY
PREFILLED_SYRINGE | INTRAVENOUS | Status: DC | PRN
  Administered 2017-10-26: 70 mg via INTRAVENOUS
  Administered 2017-10-26: 10 mg via INTRAVENOUS
  Administered 2017-10-26: 30 mg via INTRAVENOUS

## 2017-10-26 MED ORDER — PROTAMINE SULFATE 10 MG/ML IV SOLN
INTRAVENOUS | Status: DC | PRN
  Administered 2017-10-26: 50 mg via INTRAVENOUS

## 2017-10-26 MED ORDER — POTASSIUM CHLORIDE CRYS ER 20 MEQ PO TBCR: 20.0000 meq | EXTENDED_RELEASE_TABLET | Freq: Once | ORAL | Status: DC | PRN

## 2017-10-26 MED ORDER — PANTOPRAZOLE SODIUM 40 MG PO TBEC
40.0000 mg | DELAYED_RELEASE_TABLET | Freq: Every day | ORAL | Status: DC
  Administered 2017-10-27 – 2017-10-28 (×2): 40 mg via ORAL
  Filled 2017-10-26 (×2): qty 1

## 2017-10-26 MED ORDER — ARTIFICIAL TEARS OPHTHALMIC OINT
TOPICAL_OINTMENT | OPHTHALMIC | Status: DC | PRN
  Administered 2017-10-26: 1 via OPHTHALMIC

## 2017-10-26 MED ORDER — OXYCODONE-ACETAMINOPHEN 5-325 MG PO TABS
1.0000 | ORAL_TABLET | ORAL | Status: DC | PRN
  Administered 2017-10-26: 2 via ORAL
  Filled 2017-10-26: qty 2

## 2017-10-26 MED ORDER — DEXAMETHASONE SODIUM PHOSPHATE 10 MG/ML IJ SOLN
INTRAMUSCULAR | Status: AC
  Filled 2017-10-26: qty 1

## 2017-10-26 MED ORDER — LACTATED RINGERS IV SOLN
INTRAVENOUS | Status: DC | PRN
  Administered 2017-10-26: 09:00:00 via INTRAVENOUS

## 2017-10-26 MED ORDER — HYDRALAZINE HCL 20 MG/ML IJ SOLN
5.0000 mg | INTRAMUSCULAR | Status: AC | PRN
  Administered 2017-10-26 (×2): 5 mg via INTRAVENOUS
  Filled 2017-10-26: qty 1

## 2017-10-26 MED ORDER — PHENYLEPHRINE HCL-NACL 10-0.9 MG/250ML-% IV SOLN
0.0000 ug/min | INTRAVENOUS | Status: DC
  Administered 2017-10-26: 20 ug/min via INTRAVENOUS
  Filled 2017-10-26: qty 250

## 2017-10-26 MED ORDER — PROPOFOL 10 MG/ML IV BOLUS
INTRAVENOUS | Status: AC
  Filled 2017-10-26: qty 20

## 2017-10-26 MED ORDER — SODIUM CHLORIDE 0.9 % IV SOLN
INTRAVENOUS | Status: DC | PRN
  Administered 2017-10-26: 500 mL via INTRAVENOUS

## 2017-10-26 MED ORDER — ONDANSETRON HCL 4 MG/2ML IJ SOLN
INTRAMUSCULAR | Status: AC
  Filled 2017-10-26: qty 2

## 2017-10-26 MED ORDER — CEFAZOLIN SODIUM-DEXTROSE 2-4 GM/100ML-% IV SOLN
2.0000 g | INTRAVENOUS | Status: AC
  Administered 2017-10-26: 2 g via INTRAVENOUS

## 2017-10-26 MED ORDER — ROCURONIUM BROMIDE 50 MG/5ML IV SOSY
PREFILLED_SYRINGE | INTRAVENOUS | Status: AC
  Filled 2017-10-26: qty 5

## 2017-10-26 MED ORDER — ACETAMINOPHEN 325 MG RE SUPP: 325.0000 mg | RECTAL | Status: DC | PRN

## 2017-10-26 MED ORDER — ONDANSETRON HCL 4 MG/2ML IJ SOLN: 4.0000 mg | Freq: Four times a day (QID) | INTRAMUSCULAR | Status: DC | PRN

## 2017-10-26 MED ORDER — METOPROLOL TARTRATE 5 MG/5ML IV SOLN: 2.0000 mg | INTRAVENOUS | Status: DC | PRN

## 2017-10-26 MED ORDER — GUAIFENESIN-DM 100-10 MG/5ML PO SYRP: 15.0000 mL | ORAL_SOLUTION | ORAL | Status: DC | PRN

## 2017-10-26 MED ORDER — SODIUM CHLORIDE 0.9 % IV SOLN: 500.0000 mL | Freq: Once | INTRAVENOUS | Status: DC | PRN

## 2017-10-26 MED ORDER — CEFAZOLIN SODIUM-DEXTROSE 2-4 GM/100ML-% IV SOLN
INTRAVENOUS | Status: AC
  Filled 2017-10-26: qty 100

## 2017-10-26 MED ORDER — FENTANYL CITRATE (PF) 250 MCG/5ML IJ SOLN
INTRAMUSCULAR | Status: AC
  Filled 2017-10-26: qty 5

## 2017-10-26 MED ORDER — MIDAZOLAM HCL 5 MG/5ML IJ SOLN
INTRAMUSCULAR | Status: DC | PRN
  Administered 2017-10-26 (×2): 1 mg via INTRAVENOUS

## 2017-10-26 MED ORDER — FENTANYL CITRATE (PF) 100 MCG/2ML IJ SOLN
INTRAMUSCULAR | Status: AC
  Administered 2017-10-26: 50 ug
  Filled 2017-10-26: qty 2

## 2017-10-26 SURGICAL SUPPLY — 61 items
CANISTER SUCT 3000ML PPV (MISCELLANEOUS) ×2 IMPLANT
CANNULA VESSEL 3MM 2 BLNT TIP (CANNULA) IMPLANT
CATH ACCU-VU SIZ PIG 5F 100CM (CATHETERS) ×2 IMPLANT
CATH BALLN TRILOBE 26-42 (BALLOONS) ×2 IMPLANT
CATH BEACON 5 .035 65 KMP TIP (CATHETERS) ×2 IMPLANT
CLIP LIGATING EXTRA MED SLVR (CLIP) IMPLANT
CLIP LIGATING EXTRA SM BLUE (MISCELLANEOUS) IMPLANT
COVER PROBE W GEL 5X96 (DRAPES) ×2 IMPLANT
DERMABOND ADHESIVE PROPEN (GAUZE/BANDAGES/DRESSINGS) ×1
DERMABOND ADVANCED (GAUZE/BANDAGES/DRESSINGS)
DERMABOND ADVANCED .7 DNX12 (GAUZE/BANDAGES/DRESSINGS) IMPLANT
DERMABOND ADVANCED .7 DNX6 (GAUZE/BANDAGES/DRESSINGS) ×1 IMPLANT
DEVICE CLOSURE PERCLS PRGLD 6F (VASCULAR PRODUCTS) ×2 IMPLANT
DRAIN SUBARACHNOID (WOUND CARE) ×2 IMPLANT
DRSG TEGADERM 2-3/8X2-3/4 SM (GAUZE/BANDAGES/DRESSINGS) ×2 IMPLANT
DRYSEAL FLEXSHEATH 22FR 33CM (SHEATH) ×1
ELECT REM PT RETURN 9FT ADLT (ELECTROSURGICAL) ×4
ELECTRODE REM PT RTRN 9FT ADLT (ELECTROSURGICAL) ×2 IMPLANT
ENDOPROSTHESIS THORAC 31X31X15 (Endovascular Graft) ×1 IMPLANT
ENDOPROTH THORACIC 31X31X15 (Endovascular Graft) ×2 IMPLANT
GLOVE BIOGEL PI IND STRL 7.0 (GLOVE) ×3 IMPLANT
GLOVE BIOGEL PI IND STRL 7.5 (GLOVE) ×1 IMPLANT
GLOVE BIOGEL PI INDICATOR 7.0 (GLOVE) ×3
GLOVE BIOGEL PI INDICATOR 7.5 (GLOVE) ×1
GLOVE SS BIOGEL STRL SZ 7.5 (GLOVE) ×1 IMPLANT
GLOVE SUPERSENSE BIOGEL SZ 7.5 (GLOVE) ×1
GLOVE SURG SS PI 6.5 STRL IVOR (GLOVE) ×2 IMPLANT
GLOVE SURG SS PI 7.5 STRL IVOR (GLOVE) ×2 IMPLANT
GOWN STRL REUS W/ TWL LRG LVL3 (GOWN DISPOSABLE) ×3 IMPLANT
GOWN STRL REUS W/TWL LRG LVL3 (GOWN DISPOSABLE) ×3
GRAFT BALLN CATH 65CM (STENTS) ×1 IMPLANT
KIT BASIN OR (CUSTOM PROCEDURE TRAY) ×2 IMPLANT
KIT DRAIN CSF ACCUDRAIN (MISCELLANEOUS) ×2 IMPLANT
KIT TURNOVER KIT B (KITS) ×2 IMPLANT
LEG CONTRALATERAL 16X16X9.5 (Endovascular Graft) ×2 IMPLANT
NEEDLE PERC 18GX7CM (NEEDLE) ×2 IMPLANT
NS IRRIG 1000ML POUR BTL (IV SOLUTION) ×4 IMPLANT
PACK ENDOVASCULAR (PACKS) ×2 IMPLANT
PAD ARMBOARD 7.5X6 YLW CONV (MISCELLANEOUS) ×4 IMPLANT
PERCLOSE PROGLIDE 6F (VASCULAR PRODUCTS) ×4
SET MICROPUNCTURE 5F STIFF (MISCELLANEOUS) ×2 IMPLANT
SHEATH AVANTI 11CM 8FR (SHEATH) IMPLANT
SHEATH DRYSEAL FLEX 22FR 33CM (SHEATH) ×1 IMPLANT
SHIELD RADPAD SCOOP 12X17 (MISCELLANEOUS) ×4 IMPLANT
STAPLER VISISTAT 35W (STAPLE) IMPLANT
STENT GRAFT BALLN CATH 65CM (STENTS) ×1
STENT GRFT THORAC ACS 37X37X10 (Endovascular Graft) ×2 IMPLANT
STENT GRFT THORAC ACS 37X37X20 (Endovascular Graft) ×2 IMPLANT
STOPCOCK MORSE 400PSI 3WAY (MISCELLANEOUS) ×2 IMPLANT
SUT ETHILON 3 0 PS 1 (SUTURE) IMPLANT
SUT PROLENE 5 0 C 1 24 (SUTURE) IMPLANT
SUT VIC AB 2-0 CTX 36 (SUTURE) IMPLANT
SUT VIC AB 3-0 SH 27 (SUTURE)
SUT VIC AB 3-0 SH 27X BRD (SUTURE) IMPLANT
SYR 30ML LL (SYRINGE) IMPLANT
SYR 50ML LL SCALE MARK (SYRINGE) ×2 IMPLANT
TOWEL GREEN STERILE (TOWEL DISPOSABLE) ×2 IMPLANT
TRAY FOLEY MTR SLVR 16FR STAT (SET/KITS/TRAYS/PACK) ×2 IMPLANT
TUBING HIGH PRESSURE 120CM (CONNECTOR) ×2 IMPLANT
WIRE BENTSON .035X145CM (WIRE) ×2 IMPLANT
WIRE STIFF LUNDERQUIST 260CM (WIRE) IMPLANT

## 2017-10-26 NOTE — Anesthesia Procedure Notes (Addendum)
Procedure Name: Intubation Date/Time: 10/26/2017 9:07 AM Performed by: Wilkes, [place] R, RN Pre-anesthesia Checklist: Patient identified Patient Re-evaluated:Patient Re-evaluated prior to induction Oxygen Delivery Method: Circle system utilized Preoxygenation: Pre-oxygenation with 100% oxygen Induction Type: IV induction Ventilation: Mask ventilation without difficulty Laryngoscope Size: Mac and 4 Grade View: Grade I Tube type: Oral Tube size: 7.5 mm Number of attempts: 1 Airway Equipment and Method: Stylet Placement Confirmation: ETT inserted through vocal cords under direct vision,  positive ETCO2 and breath sounds checked- equal and bilateral Secured at: 23 cm Tube secured with: Tape Dental Injury: Teeth and Oropharynx as per pre-operative assessment

## 2017-10-26 NOTE — Discharge Instructions (Signed)
  [facility] [place]   Discharge Instructions  Endovascular Aortic Aneurysm Repair  Please refer to the following instructions for your post-procedure care. Your surgeon or Physician Assistant will discuss any changes with you.  Activity  You are encouraged to walk as much as you can. You can slowly return to normal activities but must avoid strenuous activity and heavy lifting until your doctor tells you it's OK. Avoid activities such as vacuuming or swinging a gold club. It is normal to feel tired for several weeks after your surgery. Do not drive until your doctor gives the OK and you are no longer taking prescription pain medications. It is also normal to have difficulty with sleep habits, eating, and bowel movements after surgery. These will go away with time.  Bathing/Showering  Shower daily after you go home.  Do not soak in a bathtub, hot tub, or swim until the incision heals completely.  If you have incisions in your groin, wash the groin wounds with soap and water daily and pat dry. (No tub bath-only shower)  Then put a dry gauze or washcloth there to keep this area dry to help prevent wound infection daily and as needed.  Do not use Vaseline or neosporin on your incisions.  Only use soap and water on your incisions and then protect and keep dry.  Incision Care  Shower every day. Clean your incision with mild soap and water. Pat the area dry with a clean towel. You do not need a bandage unless otherwise instructed. Do not apply any ointments or creams to your incision. If you clothing is irritating, you may cover your incision with a dry gauze pad.  Diet  Resume your normal diet. There are no special food restrictions following this procedure. A low fat/low cholesterol diet is recommended for all patients with vascular disease. In order to heal from your surgery, it is CRITICAL to get adequate nutrition. Your body requires vitamins, minerals, and protein.  Vegetables are the best source of vitamins and minerals. Vegetables also provide the perfect balance of protein. Processed food has little nutritional value, so try to avoid this.  Medications  Resume taking all of your medications unless your doctor or nurse practitioner tells you not to. If your incision is causing pain, you may take over-the-counter pain relievers such as acetaminophen (Tylenol). If you were prescribed a stronger pain medication, please be aware these medications can cause nausea and constipation. Prevent nausea by taking the medication with a snack or meal. Avoid constipation by drinking plenty of fluids and eating foods with a high amount of fiber, such as fruits, vegetables, and grains.  Do not take Tylenol if you are taking prescription pain medications.   Follow up  Our office will schedule a follow-up appointment with a CT scan 3-4 weeks after your surgery.  Please call us immediately for any of the following conditions  Severe or worsening pain in your legs or feet or in your abdomen back or chest. Increased pain, redness, drainage (pus) from your incision site. Increased abdominal pain, bloating, nausea, vomiting or persistent diarrhea. Fever of 101 degrees or higher. Swelling in your leg (s),  Reduce your risk of vascular disease  Stop smoking. If you would like help call [facility] at 1-800-QUIT-NOW (1-800-784-8669) or [place] at 336-586-4000. Manage your cholesterol Maintain a desired weight Control your diabetes Keep your blood pressure down  If you have questions, please call the office at [phone].  

## 2017-10-26 NOTE — Transfer of Care (Signed)
Immediate Anesthesia Transfer of Care Note  Patient: Jonathan Garner.  Procedure(s) Performed: THORACIC AORTIC ENDOVASCULAR STENT GRAFT WITH ILIAC EXTENSIONS (N/A )  Patient Location: PACU  Anesthesia Type:General  Level of Consciousness: awake, alert  and sedated  Airway & Oxygen Therapy: Patient connected to face mask oxygen  Post-op Assessment: Post -op Vital signs reviewed and stable  Post vital signs: stable  Last Vitals:  Vitals Value Taken Time  BP 140/77 10/26/2017 11:45 AM  Temp    Pulse 73 10/26/2017 11:47 AM  Resp 18 10/26/2017 11:47 AM  SpO2 100 % 10/26/2017 11:47 AM  Vitals shown include unvalidated device data.  Last Pain:  Vitals:   10/26/17 0646  PainSc: 0-No pain      Patients Stated Pain Goal: 3 (54/86/28 2417)  Complications: No apparent anesthesia complications

## 2017-10-26 NOTE — Anesthesia Procedure Notes (Signed)
Spinal Drain Patient location during procedure: pre-op Start time: 10/26/2017 8:15 AM End time: 10/26/2017 8:30 AM  Staffing Anesthesiologist: Murvin Natal, MD Performed: anesthesiologist   Preanesthetic Checklist Completed: patient identified, site marked, pre-op evaluation, timeout performed, IV checked, risks and benefits discussed and monitors and equipment checked  Epidural Patient position: sitting Prep: DuraPrep Patient monitoring: heart rate, cardiac monitor, continuous pulse ox and blood pressure Approach: midline Location: L4-L5  Needle:  Needle type: Tuohy  Epidural needle gauge: 14g. Needle length: 9 cm Needle insertion depth: 8 cm Catheter type: closed end Catheter size: 0.49mm ID x 1.46mm OD. Catheter at skin depth: 18 cm  Assessment Events: blood not aspirated, injection not painful, no injection resistance and negative IV test  Additional Notes Informed consent obtained prior to proceeding including risk of bleeding, infection, low back discomfort and bruising. Maximum sterile precautions took including cap, mask, gown, and gloves. Timeout performed pre-procedure verifying patient name, procedure, and platelet count.  Lumbar area prepped and draped in a sterile fashion. Patient tolerated procedure well. Reason for block:at surgeon's request

## 2017-10-26 NOTE — Anesthesia Procedure Notes (Signed)
Central Venous Catheter Insertion Performed by: Murvin Natal, MD, anesthesiologist Start/End9/12/2017 8:00 AM, 10/26/2017 8:15 AM Patient location: Pre-op. Preanesthetic checklist: patient identified, IV checked, site marked, risks and benefits discussed, surgical consent, monitors and equipment checked, pre-op evaluation, timeout performed and anesthesia consent Position: Trendelenburg Lidocaine 1% used for infiltration and patient sedated Hand hygiene performed , maximum sterile barriers used  and Seldinger technique used Catheter size: 8 Fr Total catheter length 16. Central line was placed.Double lumen Procedure performed using ultrasound guided technique. Ultrasound Notes:anatomy identified, needle tip was noted to be adjacent to the nerve/plexus identified, no ultrasound evidence of intravascular and/or intraneural injection and image(s) printed for medical record Attempts: 1 Following insertion, dressing applied, line sutured and Biopatch. Post procedure assessment: blood return through all ports, free fluid flow and no air  Patient tolerated the procedure well with no immediate complications.

## 2017-10-26 NOTE — Anesthesia Procedure Notes (Signed)
Arterial Line Insertion Start/End9/12/2017 8:50 AM, 10/26/2017 9:00 AM Performed by: Lavell Luster, CRNA, CRNA  Preanesthetic checklist: patient identified, IV checked, site marked, risks and benefits discussed, surgical consent, monitors and equipment checked, pre-op evaluation and timeout performed Lidocaine 1% used for infiltration and patient sedated Right, radial was placed Catheter size: 20 G Hand hygiene performed , maximum sterile barriers used  and Seldinger technique used Allen's test indicative of satisfactory collateral circulation Attempts: 2 Procedure performed without using ultrasound guided technique. Ultrasound Notes:anatomy identified Following insertion, Biopatch and dressing applied. Post procedure assessment: normal  Patient tolerated the procedure well with no immediate complications.

## 2017-10-26 NOTE — Progress Notes (Signed)
  Day of Surgery Note    Subjective:  Still sleepy from anesthesia    Vitals:   10/26/17 0634  BP: (!) 166/78  Pulse: 66  Resp: 20  Temp: 98 F (36.7 C)  SpO2: 98%    Incisions:   Soft without hematoma Extremities:  +palpable bilateral DP and left radial pulse; moving BLE equally Cardiac:  regular Lungs:  Non labored    Assessment/Plan:  This is a 72 y.o. male who is s/p  Endovascular repair thoracic aortic aneurysm   -keep systolic pressures 114-643'X  -moving BLE equally -monitor lumbar drain 8-12mmHg -transfer to SICU later today   Leontine Locket, PA-C 10/26/2017 11:48 AM [phone]

## 2017-10-26 NOTE — H&P (Signed)
Office Visit   08/02/2017 [facility] Crate, MD  Vascular Surgery   Dissection of aorta, thoracic [facility]) +1 more  Dx   Aneurysm   ; Referred by Glenford Bayley, DO  Reason for Visit   Additional Documentation   Vitals:   BP 110/64 (BP Location: Left Wrist, Patient Position: Sitting, Cuff Size: Normal)   Pulse 82   Temp 98.2 F (36.8 C) (Oral)   Resp 20   Ht 5\' 7"  (1.702 m)   Wt 104.3 kg   SpO2 97%   BMI 36.02 kg/m   BSA 2.22 m     More Vitals   Flowsheets:   Anthropometrics,   Clinical Intake,   Healthcare Directives,   MEWS Score,   Vital Signs     Encounter Info:   Billing Info,   History,   Allergies,   Detailed Report     All Notes   Progress Notes by Rosetta Posner, MD at 08/02/2017 11:45 AM  Author: Rosetta Posner, MD Author Type: Physician Filed: 08/02/2017 5:38 PM  Note Status: Signed Cosign: Cosign Not Required Encounter Date: 08/02/2017  Editor: Rosetta Posner, MD (Physician)                                          [facility]  Patient name: Jonathan Garner           MRN: [id number]        DOB: [birth year]          Sex: male  REASON FOR VISIT: Discussion of CT scan chest abdomen and pelvis  HPI: Jonathan Garner is a 72 y.o. male here today for follow-up.  I had seen him in the office has an initial evaluation on 06/28/2017.  He had moved here from [place] and was establishing care.  Had prior history of infrarenal stent graft repair of abdominal aortic aneurysm and also history of type B descending thoracic dissection.  I recommended to CT scan for ongoing follow-up.  He has no new concerns since my visit with him 1 month ago      Past Medical History:  Diagnosis Date  . AAA (abdominal aortic aneurysm) ([place])   . CAD (coronary artery disease)   . Diverticulosis   . Gynecomastia   . Hx of emphysema   . Hyperlipidemia   . Hypertension   . Lumbar spinal stenosis   . OSA  (obstructive sleep apnea)   . Pedal edema          Family History  Problem Relation Age of Onset  . Alzheimer's disease Mother   . Heart attack Father   . Leukemia Father   . Hypertension Father     SOCIAL HISTORY: Social History        Tobacco Use  . Smoking status: Current Some Day Smoker    Types: Cigars  . Smokeless tobacco: Never Used  Substance Use Topics  . Alcohol use: Yes    Comment: occasionally    No Known Allergies        Current Outpatient Medications  Medication Sig Dispense Refill  . aspirin EC 81 MG tablet Take 81 mg by mouth daily.    . clopidogrel (PLAVIX) 75 MG tablet Take 75 mg by mouth daily.    . furosemide (LASIX) 20 MG tablet Take  20 mg by mouth daily.  0  . labetalol (NORMODYNE) 200 MG tablet Take 200 mg by mouth 2 (two) times daily.     No current facility-administered medications for this visit.     REVIEW OF SYSTEMS:  [X]  denotes positive finding, [ ]  denotes negative finding Cardiac  Comments:  Chest pain or chest pressure:    Shortness of breath upon exertion:    Short of breath when lying flat:    Irregular heart rhythm:        Vascular    Pain in calf, thigh, or hip brought on by ambulation:    Pain in feet at night that wakes you up from your sleep:     Blood clot in your veins:    Leg swelling:           PHYSICAL EXAM:    Vitals:   08/02/17 1148  BP: 110/64  Pulse: 82  Resp: 20  Temp: 98.2 F (36.8 C)  TempSrc: Oral  SpO2: 97%  Weight: 230 lb (104.3 kg)  Height: 5\' 7"  (1.702 m)    GENERAL: The patient is a well-nourished male, in no acute distress. The vital signs are documented above.  DATA:  I reviewed his CT scan and discussed this at length with the patient.  There are multiple issues regarding this.  He has had marked enlargement in his thoracic aorta.  In 2018 report states that this was 4.2 cm and is currently 5.4 cm.  It does begin distal to the  left subclavian takeoff but extends down to the celiac artery.  He does have a stent in his celiac artery that extends into his aorta.  He does not have any evidence of endoleak in his infrarenal stent graft.  The maximal diameter of his native aortic sac is 5.9 cm and it was described as 6.0 cm in his study in 2018.  Of some concern is that his left iliac limb of his stent graft is just inside the iliac artery past the aortic bifurcation.  This needs to be watched closely but currently does not have any evidence of endoleak associated with this  MEDICAL ISSUES: I had a very long discussion with the patient regarding this explaining that he has had rapid growth of his thoracic aneurysm and I do not feel comfortable with observation only regarding this.  I explained the concern regarding the endpoint at the celiac artery.  Explained that I would review his films with several partners to establish a treatment plan.  Also explained that these sometimes require complex open hybrid repair and also explained the potential for referral to [facility] to Dr. Vinnie Level who is a regional expert on these complex problems.  We will discuss this further with him following review    Rosetta Posner, MD [facility] [phone] Pager [phone]     Addendum:  The patient has been re-examined and re-evaluated.  The patient's history and physical has been reviewed and is unchanged.    Jonathan Hoop Kinte Trim. is a 72 y.o. male is being admitted with THORACIC AORTIC ANEURYSM. All the risks, benefits and other treatment options have been discussed with the patient. The patient has consented to proceed with Procedure(s): THORACIC AORTIC ENDOVASCULAR STENT GRAFT WITH ILIAC EXTENSIONS as a surgical intervention.  Jonathan Garner 10/26/2017 6:56 AM [facility] Ve[facility]

## 2017-10-26 NOTE — Progress Notes (Signed)
Paged Early MD to clarify blood pressure readings. Ordered to go by arterial line and to keep systolic between 144 and 458. Also ordered to put in heart healthy diet.

## 2017-10-26 NOTE — Op Note (Signed)
OPERATIVE REPORT  DATE OF SURGERY: 10/26/2017  PATIENT: Jonathan Nordmann., 72 y.o. 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  PHYSICIAN ASSISTANT: Dr. Annamarie Major  ANESTHESIA: General and spinal cord drain per anesthesia  EBL: per anesthesia record  Total I/O In: 109.3 [I.V.:109.3] Out: 25 [Drains:25]  BLOOD ADMINISTERED: none  DRAINS: none  SPECIMEN: none  COUNTS CORRECT:  YES  PATIENT DISPOSITION:  PACU - hemodynamically stable  PROCEDURE DETAILS: Patient was taken to the operating placed tube supine position where the abdomen both groins were prepped and draped in usual sterile fashion.  A spinal cord drain had been placed by anesthesia preoperatively.  Using SonoSite visualization and a singlewall puncture with an 18-gauge needle, the left common femoral artery was entered and a guidewire was passed to the iliac artery.  A 8 dilator was passed over the guidewire and 2 Perclose devices were placed at 10:00 and 2:00 and partially deployed for later closure.  There was tortuosity of the left internal iliac artery.  A Bernstein catheter was used to manipulate the Bentson wire through the prior existing aorta by common iliac artery stent graft and this was positioned up into the level of the super renal aorta.  A Lunderquist wire was then exchanged and was positioned down around the aortic arch to the level of the aortic valve.  The patient was given 9000 units of intravenous heparin and after adequate circulation time 8 French sheath was removed and a 22 French dry seal sheath was positioned over the  wire.  The initial thoracic device was a 31 x 31 x 15 cm stent graft.  This was positioned to be just above the level of the existing celiac stent.  A pigtail catheter was positioned within the aneurysm sac with a buddy wire technique and injection confirmed the level of the celiac artery takeoff.  The distal portion of the thoracic stent graft was deployed.  Next the proximal thoracic stent graft was brought into the field.  This was a 37 x 37 x 20 cm length stent graft.  This was positioned over the Lunderquist wire to the level of the left subclavian artery.  Again a buddy wire was used to position the pigtail catheter and injection revealed the level of the subclavian artery takeoff.  The proximal stent graft was deployed in the deployment sheath was removed.  There was felt to be an inadequate overlap between the upper and lower thoracic stent graft and therefore a 10 cm bridge which was 37 x 37 was positioned through the 22 French sheath to overlap the prior to place stents.  This was deployed.  Trilobed balloon was used to dilate the distal juncture and the overlap areas.  The pigtail catheter was positioned above the level of the proximal stent in the aortic arch.  Arteriogram revealed excellent positioning distal to the left subclavian artery takeoff and no evidence of endoleak.  The left iliac extension which was a 16 x 9.5 cm iliac extension was passed through the 41 [place] in the level of the prior left iliac limb.  Hand-injection through the 22 [place] sheath revealed the takeoff  of the internal iliac artery and the left.  The stent graft was deployed to land just proximal to this takeoff.  The junction and the distal placement were gently dilated with the balloon.  Retrograde injection through the sheath showed good positioning with no evidence of leak.  The Lunderquist wire was exchanged for a Bentson wire.  The 22 French sheath was removed and pressure was held.  The prior placed Perclose devices  were tightened giving excellent hemostasis.  The patient was given 50 mg of protamine to reverse the heparin.  The puncture was closed with a 4-0 subcuticular Vicryl suture.  The patient had easily palpable dorsalis pedis pulses bilaterally.  He was awakened in the operating room and moving his lower extremities without difficulty and having normal sensation.  He was transferred to the recovery room in stable condition   Rosetta Posner, M.D., [facility] 10/26/2017 8:25 PM

## 2017-10-27 ENCOUNTER — Other Ambulatory Visit: Payer: Self-pay

## 2017-10-27 ENCOUNTER — Encounter (HOSPITAL_COMMUNITY): Payer: Self-pay | Admitting: Vascular Surgery

## 2017-10-27 DIAGNOSIS — G8918 Other acute postprocedural pain: Secondary | ICD-10-CM | POA: Diagnosis not present

## 2017-10-27 DIAGNOSIS — R69 Illness, unspecified: Secondary | ICD-10-CM | POA: Diagnosis not present

## 2017-10-27 LAB — CBC
HCT: 37.2 % — ABNORMAL LOW (ref 39.0–52.0)
Hemoglobin: 12 g/dL — ABNORMAL LOW (ref 13.0–17.0)
MCH: 29.2 pg (ref 26.0–34.0)
MCHC: 32.3 g/dL (ref 30.0–36.0)
MCV: 90.5 fL (ref 78.0–100.0)
Platelets: 214 10*3/uL (ref 150–400)
RBC: 4.11 MIL/uL — ABNORMAL LOW (ref 4.22–5.81)
RDW: 13.4 % (ref 11.5–15.5)
WBC: 12.9 10*3/uL — ABNORMAL HIGH (ref 4.0–10.5)

## 2017-10-27 LAB — BASIC METABOLIC PANEL
Anion gap: 8 (ref 5–15)
BUN: 11 mg/dL (ref 8–23)
CO2: 23 mmol/L (ref 22–32)
Calcium: 8.6 mg/dL — ABNORMAL LOW (ref 8.9–10.3)
Chloride: 107 mmol/L (ref 98–111)
Creatinine, Ser: 0.88 mg/dL (ref 0.61–1.24)
GFR calc Af Amer: >60 mL/min (ref >60)
GFR calc non Af Amer: >60 mL/min (ref >60)
Glucose, Bld: 140 mg/dL — ABNORMAL HIGH (ref 70–99)
Potassium: 3.8 mmol/L (ref 3.5–5.1)
Sodium: 138 mmol/L (ref 135–145)

## 2017-10-27 LAB — GLUCOSE, CAPILLARY: Glucose-Capillary: 117 mg/dL — ABNORMAL HIGH (ref 70–99)

## 2017-10-27 MED ORDER — BISACODYL 10 MG RE SUPP: 10.0000 mg | Freq: Once | RECTAL | Status: DC

## 2017-10-27 MED ORDER — SODIUM CHLORIDE 0.9% FLUSH: 10.0000 mL | INTRAVENOUS | Status: DC | PRN

## 2017-10-27 MED ORDER — INFLUENZA VAC SPLIT HIGH-DOSE 0.5 ML IM SUSY
0.5000 mL | PREFILLED_SYRINGE | INTRAMUSCULAR | Status: DC
  Filled 2017-10-27: qty 0.5

## 2017-10-27 MED ORDER — SODIUM CHLORIDE 0.9% FLUSH: 10.0000 mL | Freq: Two times a day (BID) | INTRAVENOUS | Status: DC

## 2017-10-27 MED ORDER — CHLORHEXIDINE GLUCONATE CLOTH 2 % EX PADS: 6.0000 | MEDICATED_PAD | Freq: Every day | CUTANEOUS | Status: DC

## 2017-10-27 NOTE — Anesthesia Postprocedure Evaluation (Signed)
Anesthesia Post Note  Patient: Jonathan Garner.  Procedure(s) Performed: THORACIC AORTIC ENDOVASCULAR STENT GRAFT WITH ILIAC EXTENSIONS (N/A )     Patient location during evaluation: ICU Anesthesia Type: General Level of consciousness: awake and alert Pain management: pain level controlled Vital Signs Assessment: post-procedure vital signs reviewed and stable Respiratory status: spontaneous breathing, nonlabored ventilation, respiratory function stable and patient connected to nasal cannula oxygen Cardiovascular status: blood pressure returned to baseline and stable Postop Assessment: no apparent nausea or vomiting Anesthetic complications: no    Last Vitals:  Vitals:   10/27/17 1139 10/27/17 1200  BP:  (!) 180/75  Pulse:  81  Resp:  (!) 22  Temp: 37.2 C   SpO2:  98%    Last Pain:  Vitals:   10/27/17 1139  TempSrc: Oral  PainSc:                  Ryan P Ellender

## 2017-10-27 NOTE — Progress Notes (Signed)
Patient evaluated in ICU. Patient denies complaints. Central line and arterial line have been removed. Motor and sensation intact to bilateral lower extremities. Patient reports numbness to tops of feet, but says this was present preoperatively. Lumbar dressing clean, dry, and intact. No edema or erythema noted. Area not tender to palpation. Will continue to monitor lumbar spinal drain.

## 2017-10-27 NOTE — Progress Notes (Signed)
Patient ID: Jonathan Garner., male   DOB: [birth year], 72 y.o.   MRN: [id number] Comfortable postop day #1. Does report a full sensation and sensation of gaseous distention in his abdomen.  Has had some flatus with some relief. Left groin without hematoma.  2+ dorsalis pedis pulses bilaterally. Neurologically at baseline.  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  Is able to move his feet feel asleep.  Stable.  Will His spinal cord drain and mobilize.  Keep in ICU.  Plan to DC drain and discharge tomorrow if he remains stable.

## 2017-10-27 NOTE — Plan of Care (Signed)
  Problem: Education: Goal: Knowledge of General Education information will improve Description Including pain rating scale, medication(s)/side effects and non-pharmacologic comfort measures Outcome: Progressing   Problem: Clinical Measurements: Goal: Ability to maintain clinical measurements within normal limits will improve Outcome: Progressing Goal: Cardiovascular complication will be avoided Outcome: Progressing   Problem: Activity: Goal: Risk for activity intolerance will decrease Outcome: Progressing   Problem: Nutrition: Goal: Adequate nutrition will be maintained Outcome: Progressing   Problem: Coping: Goal: Level of anxiety will decrease Outcome: Progressing   Problem: Pain Managment: Goal: General experience of comfort will improve Outcome: Progressing   Problem: Safety: Goal: Ability to remain free from injury will improve Outcome: Progressing   

## 2017-10-28 DIAGNOSIS — R69 Illness, unspecified: Secondary | ICD-10-CM | POA: Diagnosis not present

## 2017-10-28 DIAGNOSIS — G8918 Other acute postprocedural pain: Secondary | ICD-10-CM | POA: Diagnosis not present

## 2017-10-28 MED ORDER — OXYCODONE-ACETAMINOPHEN 5-325 MG PO TABS: 1.0000 | ORAL_TABLET | Freq: Four times a day (QID) | ORAL | 0 refills | Status: DC | PRN

## 2017-10-28 NOTE — Plan of Care (Signed)
Patient being discharged to home.  Joellen [place], RN

## 2017-10-28 NOTE — Progress Notes (Signed)
Notified by Dr. Donnetta Hutching that patient reports fullness in throat. Patient examined in ICU bed 2. Ulceration present on uvula, likely due to endotracheal tube. Patient encouraged to follow up with ENT, if ulceration persists.

## 2017-10-28 NOTE — Addendum Note (Signed)
Addendum  created 10/28/17 0846 by Murvin Natal, MD   Sign clinical note

## 2017-10-28 NOTE — Addendum Note (Signed)
Addendum  created 10/28/17 1326 by Murvin Natal, MD   Sign clinical note

## 2017-10-28 NOTE — Progress Notes (Signed)
Motor intact to bilateral lower extremities. Lumbar drain removed, tip intact. Dressing applied.

## 2017-10-28 NOTE — Progress Notes (Signed)
Patient ID: Jonathan Garner., male   DOB: 07-31-1945, 72 y.o.   MRN: [id number] Looks great this morning.  Has had complete resolution of gas pains in his lower abdomen.  Ready for discharge.  Does have some mildly elevated blood pressure.  Left groin without hematoma. Neurologically at baseline in his lower extremities.  Patient does report the sensation of fullness in the back of his throat.  On exam it appears as though he has a fullness on his uvula.  Had asked Dr.Ellender to look at this prior to his discharge.  May require ENT evaluation as an outpatient

## 2017-10-31 NOTE — Discharge Summary (Signed)
[facility] Discharge Summary   Patient ID:  Jonathan Garner. MRN: [id number] DOB/AGE: Jul 14, 1945 72 y.o.  Admit date: 10/26/2017 Discharge date: 10/28/2017 Date of Surgery: 10/26/2017 Surgeon: Surgeon(s): Early, Arvilla Meres, MD Serafina Mitchell, MD  Admission Diagnosis: THORACIC AORTIC ANEURYSM  Discharge Diagnoses:  THORACIC AORTIC ANEURYSM  Secondary Diagnoses: Past Medical History:  Diagnosis Date  . AAA (abdominal aortic aneurysm) ([place])   . CAD (coronary artery disease)   . CHF (congestive heart failure) ([place])   . Diverticulosis   . Dyspnea    W/ EXERTION   . Gynecomastia   . Hx of emphysema   . Hyperlipidemia   . Hypertension   . Lumbar spinal stenosis   . OSA (obstructive sleep apnea)   . Pedal edema     Procedure(s): THORACIC AORTIC ENDOVASCULAR STENT GRAFT WITH ILIAC EXTENSIONS  Discharged Condition: good  HPI: Jonathan Dunkerleyis a 72 y.o.malehere today for follow-up. I had seen him in the office has an initial evaluation on 06/28/2017. He had moved here from [place] and was establishing care. Had prior history of infrarenal stent graft repair of abdominal aortic aneurysm and also history of type B descending thoracic dissection. I recommended to CT scan for ongoing follow-up. He has had marked enlargement in his thoracic aorta. In 2018 report states that this was 4.2 cm and is currently 5.4 cm.  He was scheduled for TVAR.   Hospital Course:  Jonathan Garner. is a 72 y.o. male is S/P  Procedure(s): THORACIC AORTIC ENDOVASCULAR STENT GRAFT WITH ILIAC EXTENSIONS POD 1 N/M/V intact lumbar drain discharged.   On Dr. Luther Parody exam: Looks great this morning.  Has had complete resolution of gas pains in his lower abdomen.  Ready for discharge.  Does have some mildly elevated blood pressure.  Left groin without hematoma. Neurologically at baseline in his lower extremities.  Patient does report the sensation of fullness in the back of  his throat.  On exam it appears as though he has a fullness on his uvula.  Had asked Dr.Ellender to look at this prior to his discharge.  Stable for dischrge today.   Significant Diagnostic Studies: CBC Lab Results  Component Value Date   WBC 12.9 (H) 10/27/2017   HGB 12.0 (L) 10/27/2017   HCT 37.2 (L) 10/27/2017   MCV 90.5 10/27/2017   PLT 214 10/27/2017    BMET    Component Value Date/Time   NA 138 10/27/2017 0430   K 3.8 10/27/2017 0430   CL 107 10/27/2017 0430   CO2 23 10/27/2017 0430   GLUCOSE 140 (H) 10/27/2017 0430   BUN 11 10/27/2017 0430   CREATININE 0.88 10/27/2017 0430   CALCIUM 8.6 (L) 10/27/2017 0430   GFRNONAA >60 10/27/2017 0430   GFRAA >60 10/27/2017 0430   COAG Lab Results  Component Value Date   INR 1.04 10/26/2017   INR 1.01 10/19/2017     Disposition:  Discharge to :Home Discharge Instructions    Call MD for:  redness, tenderness, or signs of infection (pain, swelling, bleeding, redness, odor or green/yellow discharge around incision site)   Complete by:  As directed    Call MD for:  severe or increased pain, loss or decreased feeling  in affected limb(s)   Complete by:  As directed    Call MD for:  temperature >100.5   Complete by:  As directed    Resume previous diet   Complete by:  As directed  Allergies as of 10/28/2017   No Known Allergies     Medication List    TAKE these medications   aspirin EC 81 MG tablet Take 81 mg by mouth daily.   clopidogrel 75 MG tablet Commonly known as:  PLAVIX Take 75 mg by mouth daily.   furosemide 20 MG tablet Commonly known as:  LASIX Take 20 mg by mouth daily.   ibuprofen 200 MG tablet Commonly known as:  ADVIL,MOTRIN Take 800 mg by mouth every 8 (eight) hours as needed (FOR PAIN.).   ketoconazole 2 % cream Commonly known as:  NIZORAL Apply 1 application topically daily. APPLY BEHIND EARS   labetalol 200 MG tablet Commonly known as:  NORMODYNE Take 200 mg by mouth 2 (two) times  daily.   losartan 50 MG tablet Commonly known as:  COZAAR Take 50 mg by mouth daily.   oxyCODONE-acetaminophen 5-325 MG tablet Commonly known as:  PERCOCET/ROXICET Take 1 tablet by mouth every 6 (six) hours as needed for moderate pain.      Verbal and written Discharge instructions given to the patient. Wound care per Discharge AVS Follow-up Information    Early, Arvilla Meres, MD In 4 weeks.   Specialties:  Vascular Surgery, Cardiology Why:  Office will call you to arrange your appt (sent) Contact information: [place] [address] [phone]           Signed: Roxy Horseman 10/31/2017, 12:05 PM - For VQI Registry use --- Instructions: Press F2 to tab through selections.  Delete question if not applicable.   Post-op:  Time to Extubation: [x ] In OR, [ ]  < 12 hrs, [ ]  12-24 hrs, [ ]  >=24 hrs Vasopressors Req. Post-op: No MI: [x ] No, [ ]  Troponin only, [ ]  EKG or Clinical New Arrhythmia: No CHF: No ICU Stay: 2 days Transfusion: No  If yes, 0 units given  Complications: Resp failure: [x ] none, [ ]  Pneumonia, [ ]  Ventilator Chg in renal function: [x ] none, [ ]  Inc. Cr > 0.5, [ ]  Temp. Dialysis, [ ]  Permanent dialysis Leg ischemia: [x ] No, [ ]  Yes, no Surgery needed, [ ]  Yes, Surgery needed, [ ]  Amputation Bowel ischemia: [x ] No, [ ]  Medical Rx, [ ]  Surgical Rx Wound complication: [x ] No, [ ]  Superficial separation/infection, [ ]  Return to OR Return to OR: No  Return to OR for bleeding: No Stroke: [x ] None, [ ]  Minor, [ ]  Major  Discharge medications: Statin use:  No  for medical reason   ASA use:  Yes Plavix use:  Yes Beta blocker use:  Yes

## 2017-11-04 DIAGNOSIS — G4733 Obstructive sleep apnea (adult) (pediatric): Secondary | ICD-10-CM | POA: Diagnosis not present

## 2017-12-04 DIAGNOSIS — G4733 Obstructive sleep apnea (adult) (pediatric): Secondary | ICD-10-CM | POA: Diagnosis not present

## 2017-12-12 ENCOUNTER — Other Ambulatory Visit (HOSPITAL_COMMUNITY): Payer: Self-pay | Admitting: Physician Assistant

## 2017-12-12 DIAGNOSIS — M25511 Pain in right shoulder: Secondary | ICD-10-CM

## 2017-12-15 ENCOUNTER — Other Ambulatory Visit: Payer: Self-pay | Admitting: Physician Assistant

## 2017-12-15 ENCOUNTER — Ambulatory Visit
Admission: RE | Admit: 2017-12-15 | Discharge: 2017-12-15 | Disposition: A | Discharge status: Home / Self Care | Payer: Medicare HMO | Source: Ambulatory Visit | Attending: Physician Assistant | Admitting: Physician Assistant

## 2017-12-15 ENCOUNTER — Encounter (HOSPITAL_COMMUNITY): Payer: Self-pay

## 2017-12-15 ENCOUNTER — Ambulatory Visit (HOSPITAL_COMMUNITY)
Admission: RE | Admit: 2017-12-15 | Discharge: 2017-12-15 | Disposition: A | Discharge status: Home / Self Care | Payer: Medicare HMO | Source: Ambulatory Visit | Attending: Physician Assistant | Admitting: Physician Assistant

## 2017-12-15 DIAGNOSIS — M25511 Pain in right shoulder: Secondary | ICD-10-CM

## 2017-12-16 ENCOUNTER — Other Ambulatory Visit: Payer: Self-pay | Admitting: Physician Assistant

## 2017-12-16 ENCOUNTER — Ambulatory Visit
Admission: RE | Admit: 2017-12-16 | Discharge: 2017-12-16 | Disposition: A | Discharge status: Home / Self Care | Payer: Medicare HMO | Source: Ambulatory Visit | Attending: Physician Assistant | Admitting: Physician Assistant

## 2017-12-16 ENCOUNTER — Inpatient Hospital Stay
Admission: RE | Admit: 2017-12-16 | Discharge: 2017-12-16 | Disposition: A | Discharge status: Home / Self Care | Payer: Medicare HMO | Source: Ambulatory Visit | Attending: Physician Assistant | Admitting: Physician Assistant

## 2017-12-16 DIAGNOSIS — M25511 Pain in right shoulder: Secondary | ICD-10-CM

## 2017-12-16 DIAGNOSIS — M75111 Incomplete rotator cuff tear or rupture of right shoulder, not specified as traumatic: Secondary | ICD-10-CM | POA: Diagnosis not present

## 2018-01-04 DIAGNOSIS — G4733 Obstructive sleep apnea (adult) (pediatric): Secondary | ICD-10-CM | POA: Diagnosis not present

## 2018-02-03 DIAGNOSIS — G4733 Obstructive sleep apnea (adult) (pediatric): Secondary | ICD-10-CM | POA: Diagnosis not present

## 2018-02-13 ENCOUNTER — Ambulatory Visit: Payer: Medicare HMO | Admitting: Cardiovascular Disease

## 2018-02-13 ENCOUNTER — Encounter: Payer: Self-pay | Admitting: Cardiovascular Disease

## 2018-02-13 VITALS — BP 152/82 | HR 79 | Ht 67.0 in | Wt 245.0 lb

## 2018-02-13 DIAGNOSIS — I10 Essential (primary) hypertension: Secondary | ICD-10-CM | POA: Diagnosis not present

## 2018-02-13 DIAGNOSIS — I714 Abdominal aortic aneurysm, without rupture, unspecified: Secondary | ICD-10-CM

## 2018-02-13 DIAGNOSIS — R69 Illness, unspecified: Secondary | ICD-10-CM | POA: Diagnosis not present

## 2018-02-13 DIAGNOSIS — R0602 Shortness of breath: Secondary | ICD-10-CM

## 2018-02-13 DIAGNOSIS — I5032 Chronic diastolic (congestive) heart failure: Secondary | ICD-10-CM | POA: Diagnosis not present

## 2018-02-13 DIAGNOSIS — I71012 Dissection of descending thoracic aorta: Secondary | ICD-10-CM

## 2018-02-13 DIAGNOSIS — E785 Hyperlipidemia, unspecified: Secondary | ICD-10-CM

## 2018-02-13 DIAGNOSIS — I7101 Dissection of thoracic aorta: Secondary | ICD-10-CM

## 2018-02-13 MED ORDER — FUROSEMIDE 40 MG PO TABS: 40.0000 mg | ORAL_TABLET | Freq: Every day | ORAL | 6 refills | Status: DC

## 2018-02-13 MED ORDER — ALBUTEROL SULFATE HFA 108 (90 BASE) MCG/ACT IN AERS: 1.0000 | INHALATION_SPRAY | Freq: Four times a day (QID) | RESPIRATORY_TRACT | 2 refills | Status: DC | PRN

## 2018-02-13 MED ORDER — POTASSIUM CHLORIDE CRYS ER 20 MEQ PO TBCR: 20.0000 meq | EXTENDED_RELEASE_TABLET | Freq: Every day | ORAL | 6 refills | Status: DC

## 2018-02-13 NOTE — Patient Instructions (Signed)
Medication Instructions:   Begin Albuterol MDI - may use 1-2 puffs every 6 hours as needed for shortness of breath.  Increase Lasix to 40mg  twice a day x 3 days, then to 40mg  daily thereafter.   Begin Potassium 67meq daily.   Continue all other medications.    Labwork:  BMET - order given today.   Please do this Thursday.   Office will contact with results via phone or letter.    Testing/Procedures: none  Follow-Up: 4 months   Any Other Special Instructions Will Be Listed Below (If Applicable). You have been referred to:  [facility]   If you need a refill on your cardiac medications before your next appointment, please call your pharmacy.

## 2018-02-13 NOTE — Progress Notes (Signed)
SUBJECTIVE: The patient presents for follow-up of chronic diastolic heart failure.  I reviewed an echocardiogram report dated 07/07/2017 which demonstrated normal left ventricular systolic function, LVEF 60 to 65%, moderate concentric LVH, grade 2 diastolic dysfunction, moderate left atrial dilatation, mild aortic cusp sclerosis, and posterior mitral annular calcification.  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   He presents today with multiple diffuse somatic complaints.  He has bilateral feet swelling, right worse than left with numbness.  He denies chest pain but complains of exertional dyspnea.  He quit smoking cigarettes about 3 years ago and now smokes 3-4 small cigars daily.  He become short of breath when walking 30 to 40 yards to his mailbox.  He has taken 40 mg of Lasix at times without relief of feet swelling.  He tells me he is never had any formal testing for COPD.  His wife has COPD.    Review of Systems: As per "subjective", otherwise negative.  No Known Allergies  Current Outpatient Medications  Medication Sig Dispense Refill  . aspirin EC 81 MG tablet Take 81 mg by mouth daily.    . clopidogrel (PLAVIX) 75 MG tablet Take 75 mg by mouth daily.    . furosemide (LASIX) 20 MG tablet Take 20 mg by mouth daily.  0  . ibuprofen (ADVIL,MOTRIN) 200 MG tablet Take 800 mg by mouth every 8 (eight) hours as needed (FOR PAIN.).    Marland Kitchen ketoconazole (NIZORAL) 2 % cream Apply 1 application topically daily. APPLY BEHIND EARS  0  . labetalol (NORMODYNE) 200 MG tablet Take 200 mg by mouth 2 (two) times daily.    Marland Kitchen losartan (COZAAR) 50 MG tablet Take 50 mg by mouth daily.   0  . oxyCODONE-acetaminophen (PERCOCET/ROXICET) 5-325 MG tablet Take 1 tablet by mouth every 6 (six) hours as needed for moderate pain. 12 tablet 0   No current facility-administered medications for this visit.     Past  Medical History:  Diagnosis Date  . AAA (abdominal aortic aneurysm) ([place])   . CAD (coronary artery disease)   . CHF (congestive heart failure) ([place])   . Diverticulosis   . Dyspnea    W/ EXERTION   . Gynecomastia   . Hx of emphysema   . Hyperlipidemia   . Hypertension   . Lumbar spinal stenosis   . OSA (obstructive sleep apnea)   . Pedal edema     Past Surgical History:  Procedure Laterality Date  . ABDOMINAL AORTIC ANEURYSM REPAIR  2013 and 2018  . BACK SURGERY    . CARDIAC CATHETERIZATION  04/2016   [place], [place]  . ELBOW ARTHROSCOPY Left 2011  . NECK SURGERY    . THORACIC AORTIC ENDOVASCULAR STENT GRAFT N/A 10/26/2017   Procedure: THORACIC AORTIC ENDOVASCULAR STENT GRAFT WITH ILIAC EXTENSIONS;  Surgeon: Rosetta Posner, MD;  Location: [facility] OR;  Service: Vascular;  Laterality: N/A;  PATIENT WILL NEED SPINAL CORD DRAIN BY ANESTHESIA  . VASECTOMY  1976    Social History   Socioeconomic History  . Marital status: Married    Spouse name: Not on file  . Number of children: Not on file  . Years of education: Not on file  . Highest education level: Not on file  Occupational History  . Not on file  Social Needs  . Financial resource strain: Not on file  . Food insecurity:    Worry: Not  on file    Inability: Not on file  . Transportation needs:    Medical: Not on file    Non-medical: Not on file  Tobacco Use  . Smoking status: Former Smoker    Types: Cigars    Last attempt to quit: 07/28/2017    Years since quitting: 0.5  . Smokeless tobacco: Never Used  Substance and Sexual Activity  . Alcohol use: Yes    Comment: occasionally  . Drug use: Never  . Sexual activity: Not on file  Lifestyle  . Physical activity:    Days per week: Not on file    Minutes per session: Not on file  . Stress: Not on file  Relationships  . Social connections:    Talks on phone: Not on file    Gets together: Not on file    Attends religious service: Not on file    Active member of club or  organization: Not on file    Attends meetings of clubs or organizations: Not on file    Relationship status: Not on file  . Intimate partner violence:    Fear of current or ex partner: Not on file    Emotionally abused: Not on file    Physically abused: Not on file    Forced sexual activity: Not on file  Other Topics Concern  . Not on file  Social History Narrative  . Not on file     Vitals:   02/13/18 0946  BP: (!) 152/82  Pulse: 79  SpO2: 99%  Weight: 245 lb (111.1 kg)  Height: 5\' 7"  (1.702 m)    Wt Readings from Last 3 Encounters:  02/13/18 245 lb (111.1 kg)  10/26/17 238 lb 14.4 oz (108.4 kg)  10/19/17 238 lb 14.4 oz (108.4 kg)     PHYSICAL EXAM General: NAD HEENT: Normal. Neck: No JVD, no thyromegaly. Lungs: Faint scattered expiratory wheezes, no crackles. CV: Regular rate and rhythm, normal S1/S2, no S3/S4, no murmur.  Trace bilateral pitting lower extremity edema.  Abdomen: Soft, nontender, no distention.  Neurologic: Alert and oriented.  Psych: Normal affect. Skin: Normal. Musculoskeletal: No gross deformities.    ECG: Reviewed above under Subjective   Labs: Lab Results  Component Value Date/Time   K 3.8 10/27/2017 04:30 AM   BUN 11 10/27/2017 04:30 AM   CREATININE 0.88 10/27/2017 04:30 AM   ALT 15 10/19/2017 11:31 AM   HGB 12.0 (L) 10/27/2017 04:30 AM     Lipids: No results found for: LDLCALC, LDLDIRECT, CHOL, TRIG, HDL     ASSESSMENT AND PLAN: 1. Chronic diastolic heart failure/hypertensive heart disease:  Blood pressure is elevated although he has not taken his medications.  I will increase Lasix to 40 mg twice daily for 3 days and then reduce to 40 mg daily.  I will prescribe supplemental potassium chloride 20 mEq daily.  I will check a basic metabolic panel within the next few days. He has grade 2 diastolic dysfunction with moderate concentric LVH.  2. Hypertension: Blood pressure is elevated today although he has not taken his  medications.  Currently on labetalol and losartan.  I will monitor given increased diuretic dosage.  3. Hyperlipidemia: He stopped taking statins on his own due to myalgias and joint pains.  4. Peripheral vascular disease:History of infrarenal stent graft repair of an abdominal aortic aneurysm and also a history of type B descending thoracic dissection. He underwent stent graft repair for thoracic aortic aneurysm with extension into the left common iliac  artery on 10/26/2017.  Followed by vascular surgery.  On aspirin and Plavix.  5.  Shortness of breath: I believe this is multifactorial in etiology although he very likely has COPD given his many years of tobacco use.  I will arrange for pulmonary referral and pulmonary function testing.  For the time being I will prescribe an albuterol inhaler to be used as needed.   Disposition: Follow up 4 months   Kate Sable, M.D., F.A.C.C.

## 2018-02-17 ENCOUNTER — Other Ambulatory Visit (HOSPITAL_COMMUNITY)
Admission: RE | Admit: 2018-02-17 | Discharge: 2018-02-17 | Disposition: A | Discharge status: Home / Self Care | Payer: Medicare HMO | Source: Ambulatory Visit | Attending: Cardiovascular Disease | Admitting: Cardiovascular Disease

## 2018-02-17 DIAGNOSIS — R0602 Shortness of breath: Secondary | ICD-10-CM | POA: Diagnosis not present

## 2018-02-17 DIAGNOSIS — I10 Essential (primary) hypertension: Secondary | ICD-10-CM | POA: Diagnosis not present

## 2018-02-17 LAB — BASIC METABOLIC PANEL
Anion gap: 8 (ref 5–15)
BUN: 21 mg/dL (ref 8–23)
CO2: 25 mmol/L (ref 22–32)
Calcium: 9.2 mg/dL (ref 8.9–10.3)
Chloride: 107 mmol/L (ref 98–111)
Creatinine, Ser: 1.13 mg/dL (ref 0.61–1.24)
GFR calc Af Amer: >60 mL/min (ref >60)
GFR calc non Af Amer: >60 mL/min (ref >60)
Glucose, Bld: 111 mg/dL — ABNORMAL HIGH (ref 70–99)
Potassium: 5 mmol/L (ref 3.5–5.1)
Sodium: 140 mmol/L (ref 135–145)

## 2018-03-06 DIAGNOSIS — G4733 Obstructive sleep apnea (adult) (pediatric): Secondary | ICD-10-CM | POA: Diagnosis not present

## 2018-03-09 ENCOUNTER — Encounter: Payer: Self-pay | Admitting: Pulmonary Disease

## 2018-03-09 ENCOUNTER — Ambulatory Visit: Payer: Medicare HMO | Admitting: Pulmonary Disease

## 2018-03-09 VITALS — BP 120/72 | HR 71 | Ht 67.0 in | Wt 243.0 lb

## 2018-03-09 DIAGNOSIS — I5032 Chronic diastolic (congestive) heart failure: Secondary | ICD-10-CM | POA: Diagnosis not present

## 2018-03-09 DIAGNOSIS — J449 Chronic obstructive pulmonary disease, unspecified: Secondary | ICD-10-CM

## 2018-03-09 DIAGNOSIS — J432 Centrilobular emphysema: Secondary | ICD-10-CM | POA: Diagnosis not present

## 2018-03-09 NOTE — Assessment & Plan Note (Signed)
Surprisingly does not have significant airway obstruction hence will not add long-acting bronchodilator Use albuterol MDI 2 puffs every 6 hours as needed for wheezing.  You have to quit smoking cigars ! Weight loss would help  If you remain symptomatic in spite of adequate diuresis, then we can obtain full PFTs and trial LAMA

## 2018-03-09 NOTE — Progress Notes (Signed)
Subjective:    Patient ID: Jonathan Garner., male    DOB: 17-Mar-1945, 73 y.o.   MRN: [id number]  HPI  Chief Complaint  Patient presents with  . Pulm Consult    Referred for SOB for the past year. Pt gets SOB with daily walking and walking up stairs. Is currently not using O2 but does have a CPAP machine.    73 year old obese smoker presents for evaluation of COPD. He has chronic diastolic heart failure and had an acute exacerbation requiring hospitalization in 2019. 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  He was diagnosed with OSA and is maintained on CPAP which is helped his daytime somnolence and fatigue and he is compliant. He used to live and work in [place] [employer] until he retired in 2014.  He moved to [place] to be close to a friend due to his love for hunting.  He smoked about a pack per day for 40 years and about 4 years ago quit cigarettes and started smoking 5 to 6 cigars/day.  He reports dyspnea on exertion for the past 2 years.  He has been maintained on Lasix 40 mg daily and states that this has not helped his dyspnea.  He also complains of right foot numbness. He reports occasional wheezing especially when he lies down but denies frequent chest colds.   Surprisingly spirometry showed no evidence of airway obstruction with ratio of 80, FEV1 84% FVC of 76%   Chest x-ray 10/26/2017 was reviewed which does not show any evidence of infiltrates or effusions, shows cardiomegaly and stent in thoracic aorta  Past Medical History:  Diagnosis Date  . AAA (abdominal aortic aneurysm) ([place])   . CAD (coronary artery disease)   . CHF (congestive heart failure) ([place])   . Diverticulosis   . Dyspnea    W/ EXERTION   . Gynecomastia   . Hx of emphysema   . Hyperlipidemia   . Hypertension   . Lumbar spinal stenosis   . OSA (obstructive sleep apnea)   . Pedal edema    Past Surgical History:    Procedure Laterality Date  . ABDOMINAL AORTIC ANEURYSM REPAIR  2013 and 2018  . BACK SURGERY    . CARDIAC CATHETERIZATION  04/2016   [place]  . ELBOW ARTHROSCOPY Left 2011  . NECK SURGERY    . THORACIC AORTIC ENDOVASCULAR STENT GRAFT N/A 10/26/2017   Procedure: THORACIC AORTIC ENDOVASCULAR STENT GRAFT WITH ILIAC EXTENSIONS;  Surgeon: Rosetta Posner, MD;  Location: [facility] OR;  Service: Vascular;  Laterality: N/A;  PATIENT WILL NEED SPINAL CORD DRAIN BY ANESTHESIA  . VASECTOMY  1976    No Known Allergies  Social History   Socioeconomic History  . Marital status: Married    Spouse name: Not on file  . Number of children: Not on file  . Years of education: Not on file  . Highest education level: Not on file  Occupational History  . Not on file  Social Needs  . Financial resource strain: Not on file  . Food insecurity:    Worry: Not on file    Inability: Not on file  . Transportation needs:    Medical: Not on file    Non-medical: Not on file  Tobacco Use  . Smoking status: Former Smoker    Types: Cigars    Last attempt to quit: 07/28/2017    Years since quitting: 0.6  .  Smokeless tobacco: Never Used  Substance and Sexual Activity  . Alcohol use: Yes    Comment: occasionally  . Drug use: Never  . Sexual activity: Not on file  Lifestyle  . Physical activity:    Days per week: Not on file    Minutes per session: Not on file  . Stress: Not on file  Relationships  . Social connections:    Talks on phone: Not on file    Gets together: Not on file    Attends religious service: Not on file    Active member of club or organization: Not on file    Attends meetings of clubs or organizations: Not on file    Relationship status: Not on file  . Intimate partner violence:    Fear of current or ex partner: Not on file    Emotionally abused: Not on file    Physically abused: Not on file    Forced sexual activity: Not on file  Other Topics Concern  . Not on file  Social History  Narrative  . Not on file     Family History  Problem Relation Age of Onset  . Alzheimer's disease Mother   . Heart attack Father   . Leukemia Father   . Hypertension Father       Review of Systems Constitutional: negative for anorexia, fevers and sweats  Eyes: negative for irritation, redness and visual disturbance  Ears, nose, mouth, throat, and face: negative for earaches, epistaxis, nasal congestion and sore throat  Respiratory: negative for cough,  sputum and wheezing  Cardiovascular: negative for chest pain, orthopnea, palpitations and syncope  Gastrointestinal: negative for abdominal pain, constipation, diarrhea, melena, nausea and vomiting  Genitourinary:negative for dysuria, frequency and hematuria  Hematologic/lymphatic: negative for bleeding, easy bruising and lymphadenopathy  Musculoskeletal:negative for arthralgias, muscle weakness and stiff joints  Neurological: negative for coordination problems, gait problems, headaches and weakness  Endocrine: negative for diabetic symptoms including polydipsia, polyuria and weight loss     Objective:   Physical Exam  Gen. Pleasant, obese, in no distress, normal affect ENT - no pallor,icterus, no post nasal drip, class 2-3 airway Neck: No JVD, no thyromegaly, no carotid bruits Lungs: no use of accessory muscles, no dullness to percussion, decreased without rales or rhonchi  Cardiovascular: Rhythm regular, heart sounds  normal, no murmurs or gallops, 1+ peripheral edema Abdomen: soft and non-tender, no hepatosplenomegaly, BS normal. Musculoskeletal: No deformities, no cyanosis or clubbing Neuro:  alert, non focal, no tremors       Assessment & Plan:

## 2018-03-09 NOTE — Assessment & Plan Note (Signed)
He still has residual pedal edema suggesting fluid overload  Increase Lasix to 40 mg twice daily for 10 days, aim for weight loss of 5 pounds and then get back down to once daily

## 2018-03-09 NOTE — Patient Instructions (Addendum)
Increase Lasix to 40 mg twice daily for 10 days, aim for weight loss of 5 pounds and then get back down to once daily  Use albuterol MDI 2 puffs every 6 hours as needed for wheezing.  You have to quit smoking cigars ! Weight loss would help

## 2018-06-14 ENCOUNTER — Telehealth: Payer: Self-pay | Admitting: *Deleted

## 2018-06-14 NOTE — Telephone Encounter (Signed)
The patient verbally consented for a telehealth (video) visit with [facility] and understands that his/her insurance company will be billed for the encounter.  Patient reminded to have weight and vitals ready before the phone call begins.

## 2018-06-19 ENCOUNTER — Encounter: Payer: Self-pay | Admitting: Cardiovascular Disease

## 2018-06-19 ENCOUNTER — Telehealth (INDEPENDENT_AMBULATORY_CARE_PROVIDER_SITE_OTHER): Payer: Medicare HMO | Admitting: Cardiovascular Disease

## 2018-06-19 VITALS — BP 131/71 | HR 74 | Ht 67.0 in | Wt 246.0 lb

## 2018-06-19 DIAGNOSIS — I71012 Dissection of descending thoracic aorta: Secondary | ICD-10-CM

## 2018-06-19 DIAGNOSIS — I5032 Chronic diastolic (congestive) heart failure: Secondary | ICD-10-CM

## 2018-06-19 DIAGNOSIS — R0602 Shortness of breath: Secondary | ICD-10-CM

## 2018-06-19 DIAGNOSIS — E785 Hyperlipidemia, unspecified: Secondary | ICD-10-CM

## 2018-06-19 DIAGNOSIS — I10 Essential (primary) hypertension: Secondary | ICD-10-CM

## 2018-06-19 DIAGNOSIS — I714 Abdominal aortic aneurysm, without rupture, unspecified: Secondary | ICD-10-CM

## 2018-06-19 DIAGNOSIS — I7101 Dissection of thoracic aorta: Secondary | ICD-10-CM

## 2018-06-19 MED ORDER — TORSEMIDE 20 MG PO TABS: 20.0000 mg | ORAL_TABLET | Freq: Two times a day (BID) | ORAL | 6 refills | Status: DC

## 2018-06-19 NOTE — Addendum Note (Signed)
Addended by: Laurine Blazer on: 06/19/2018 10:22 AM   Modules accepted: Orders

## 2018-06-19 NOTE — Patient Instructions (Addendum)
Medication Instructions:   Stop Lasix.   Change to Torsemide 20mg  twice a day.  Continue all other medications.    Labwork:  BMET   Please do in 1 week.  States he will do at [facility].   Office will contact with results via phone or letter.    Testing/Procedures: none  Follow-Up: Your physician wants you to follow up in: 6 months.  You will receive a reminder letter in the mail one-two months in advance.  If you don't receive a letter, please call our office to schedule the follow up appointment   Any Other Special Instructions Will Be Listed Below (If Applicable).  If you need a refill on your cardiac medications before your next appointment, please call your pharmacy.

## 2018-06-19 NOTE — Progress Notes (Addendum)
Virtual Visit via Video Note   This visit type was conducted due to national recommendations for restrictions regarding the COVID-19 Pandemic (e.g. social distancing) in an effort to limit this patient's exposure and mitigate transmission in our community.  Due to his co-morbid illnesses, this patient is at least at moderate risk for complications without adequate follow up.  This format is felt to be most appropriate for this patient at this time.  All issues noted in this document were discussed and addressed.  A limited physical exam was performed with this format.  Please refer to the patient's chart for his consent to telehealth for [facility].   Date:  06/19/2018   ID:  [name]., DOB 11-Jun-1945, MRN [id number]  Patient Location: Home Provider Location: Office  PCP:  Glenford Bayley, DO  Cardiologist:  Kate Sable, MD  Electrophysiologist:  None   Evaluation Performed:  Follow-Up Visit  Chief Complaint: Chronic diastolic heart failure  History of Present Illness:    Jonathan Garner. is a 73 y.o. male with chronic diastolic heart failure and stent graft repair for thoracic aortic aneurysm.  He has a prior history of tobacco abuse.  He was evaluated by pulmonary.  Spirometry did not reveal any significant airway obstruction.  It was recommended that he quit smoking cigars and try to achieve weight loss.  He still has significant shortness of breath, even when putting on his socks.  When Lasix was increased to 40 mg twice daily by pulmonary, he did not experience any significant additional diuresis.  He denies chest pain.  He quit smoking cigars.  He has lost 4 pounds.  His feet are swollen and they are hurting, the left foot is more swollen than the right.  The patient does not have symptoms concerning for COVID-19 infection (fever, chills, cough, or new shortness of breath).    Past Medical History:  Diagnosis Date  . AAA (abdominal aortic aneurysm) ([place])    . CAD (coronary artery disease)   . CHF (congestive heart failure) ([place])   . Diverticulosis   . Dyspnea    W/ EXERTION   . Gynecomastia   . Hx of emphysema   . Hyperlipidemia   . Hypertension   . Lumbar spinal stenosis   . OSA (obstructive sleep apnea)   . Pedal edema    Past Surgical History:  Procedure Laterality Date  . ABDOMINAL AORTIC ANEURYSM REPAIR  2013 and 2018  . BACK SURGERY    . CARDIAC CATHETERIZATION  04/2016   [place]  . ELBOW ARTHROSCOPY Left 2011  . NECK SURGERY    . THORACIC AORTIC ENDOVASCULAR STENT GRAFT N/A 10/26/2017   Procedure: THORACIC AORTIC ENDOVASCULAR STENT GRAFT WITH ILIAC EXTENSIONS;  Surgeon: Rosetta Posner, MD;  Location: [facility] OR;  Service: Vascular;  Laterality: N/A;  PATIENT WILL NEED SPINAL CORD DRAIN BY ANESTHESIA  . VASECTOMY  1976     Current Meds  Medication Sig  . albuterol (PROVENTIL HFA;VENTOLIN HFA) 108 (90 Base) MCG/ACT inhaler Inhale 1-2 puffs into the lungs every 6 (six) hours as needed for wheezing or shortness of breath.  Marland Kitchen aspirin EC 81 MG tablet Take 81 mg by mouth daily.  . clopidogrel (PLAVIX) 75 MG tablet Take 75 mg by mouth daily.  . furosemide (LASIX) 40 MG tablet Take 1 tablet (40 mg total) by mouth daily.  Marland Kitchen ibuprofen (ADVIL,MOTRIN) 200 MG tablet Take 800 mg by mouth every 8 (eight) hours as needed (FOR  PAIN.).  Marland Kitchen labetalol (NORMODYNE) 200 MG tablet Take 200 mg by mouth 2 (two) times daily.  Marland Kitchen losartan (COZAAR) 50 MG tablet Take 50 mg by mouth daily.   . potassium chloride SA (K-DUR,KLOR-CON) 20 MEQ tablet Take 1 tablet (20 mEq total) by mouth daily.     Allergies:   Patient has no known allergies.   Social History   Tobacco Use  . Smoking status: Former Smoker    Types: Cigars    Last attempt to quit: 07/28/2017    Years since quitting: 0.8  . Smokeless tobacco: Never Used  Substance Use Topics  . Alcohol use: Yes    Comment: occasionally  . Drug use: Never     Family Hx: The patient's family history  includes Alzheimer's disease in his mother; Heart attack in his father; Hypertension in his father; Leukemia in his father.  ROS:   Please see the history of present illness.     All other systems reviewed and are negative.   Prior CV studies:   The following studies were reviewed today:  Echocardiogram report dated 07/07/2017 demonstrated normal left ventricular systolic function, LVEF 60 to 65%, moderate concentric LVH, grade 2 diastolic dysfunction, moderate left atrial dilatation, mild aortic cusp sclerosis, and posterior mitral annular calcification.  Labs/Other Tests and Data Reviewed:    EKG:  No ECG reviewed.  Recent Labs: 10/19/2017: ALT 15 10/26/2017: Magnesium 2.1 10/27/2017: Hemoglobin 12.0; Platelets 214 02/17/2018: BUN 21; Creatinine, Ser 1.13; Potassium 5.0; Sodium 140   Recent Lipid Panel No results found for: CHOL, TRIG, HDL, CHOLHDL, LDLCALC, LDLDIRECT  Wt Readings from Last 3 Encounters:  06/19/18 246 lb (111.6 kg)  03/09/18 243 lb (110.2 kg)  02/13/18 245 lb (111.1 kg)     Objective:    Vital Signs:  BP 131/71   Pulse 74   Ht 5\' 7"  (1.702 m)   Wt 246 lb (111.6 kg)   BMI 38.53 kg/m    VITAL SIGNS:  reviewed  Gen: NAD HEENT: [place]/at, eomi Neck: No JVD Neuro: A+Ox 3, moves extremities spontaneously, no focal defects  ASSESSMENT & PLAN:    1. Chronic diastolic heart failure/hypertensive heart disease: Currently on Lasix 40 mg daily with supplemental potassium.He has grade 2 diastolic dysfunction with moderate concentric LVH. BP is normal.  He did not experience any additional diuresis when taking Lasix twice daily.  I will switch to torsemide 20 mg twice daily and check a basic metabolic panel in 1 week.  2. Hypertension:Blood pressure is normal today.  Currently on labetalol and losartan.   3. Hyperlipidemia:He stopped taking statins on his own due to myalgias and joint pains.  4. Peripheral vascular disease:History of infrarenal stent graft  repair of an abdominal aortic aneurysm and also a history of type B descending thoracic dissection. 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  Followed by vascular surgery.  On aspirin and Plavix.  5.  Shortness of breath: This is likely multifactorial in etiology.  He needs significant weight loss.  I will switch Lasix to torsemide for a more potent diuretic effect to see if this helps to alleviate symptoms to some degree.  He has lost 4 pounds.   COVID-19 Education: The signs and symptoms of COVID-19 were discussed with the patient and how to seek care for testing (follow up with PCP or arrange E-visit).  The importance of social distancing was discussed today.  Time:  Today, I have spent 25 minutes with the patient with telehealth technology discussing the above problems.     Medication Adjustments/Labs and Tests Ordered: Current medicines are reviewed at length with the patient today.  Concerns regarding medicines are outlined above.   Tests Ordered: No orders of the defined types were placed in this encounter.   Medication Changes: No orders of the defined types were placed in this encounter.   Disposition:  Follow up in 6 month(s)  Signed, Kate Sable, MD  06/19/2018 9:32 AM    [place]

## 2018-06-27 ENCOUNTER — Other Ambulatory Visit (HOSPITAL_COMMUNITY)
Admission: RE | Admit: 2018-06-27 | Discharge: 2018-06-27 | Disposition: A | Discharge status: Home / Self Care | Payer: Medicare HMO | Source: Ambulatory Visit | Attending: Cardiovascular Disease | Admitting: Cardiovascular Disease

## 2018-06-27 DIAGNOSIS — R0602 Shortness of breath: Secondary | ICD-10-CM | POA: Insufficient documentation

## 2018-06-27 DIAGNOSIS — I5032 Chronic diastolic (congestive) heart failure: Secondary | ICD-10-CM | POA: Insufficient documentation

## 2018-06-27 LAB — BASIC METABOLIC PANEL
Anion gap: 9 (ref 5–15)
BUN: 24 mg/dL — ABNORMAL HIGH (ref 8–23)
CO2: 26 mmol/L (ref 22–32)
Calcium: 9.1 mg/dL (ref 8.9–10.3)
Chloride: 105 mmol/L (ref 98–111)
Creatinine, Ser: 1.1 mg/dL (ref 0.61–1.24)
GFR calc Af Amer: >60 mL/min (ref >60)
GFR calc non Af Amer: >60 mL/min (ref >60)
Glucose, Bld: 117 mg/dL — ABNORMAL HIGH (ref 70–99)
Potassium: 4.3 mmol/L (ref 3.5–5.1)
Sodium: 140 mmol/L (ref 135–145)

## 2018-07-05 ENCOUNTER — Telehealth: Payer: Self-pay | Admitting: *Deleted

## 2018-07-05 DIAGNOSIS — I5032 Chronic diastolic (congestive) heart failure: Secondary | ICD-10-CM

## 2018-07-05 NOTE — Telephone Encounter (Signed)
Notes recorded by Laurine Blazer, LPN on 0/87/1994 at 12:90 AM EDT Patient notified. He will go to [facility] on 07/11/2018 to repeat lab. Copy to pmd. ------  Notes recorded by Laurine Blazer, LPN on 4/75/3391 at 79:21 AM EDT No answer.  ------  Notes recorded by Herminio Commons, MD on 06/27/2018 at 9:34 AM EDT Fairly stable overall. Would repeat in 2 weeks to ensure stability.

## 2018-07-11 ENCOUNTER — Other Ambulatory Visit (HOSPITAL_COMMUNITY)
Admission: RE | Admit: 2018-07-11 | Discharge: 2018-07-11 | Disposition: A | Discharge status: Home / Self Care | Payer: Medicare HMO | Source: Ambulatory Visit | Attending: Cardiovascular Disease | Admitting: Cardiovascular Disease

## 2018-07-11 ENCOUNTER — Other Ambulatory Visit: Payer: Self-pay

## 2018-07-11 ENCOUNTER — Telehealth: Payer: Self-pay | Admitting: *Deleted

## 2018-07-11 DIAGNOSIS — R0602 Shortness of breath: Secondary | ICD-10-CM | POA: Insufficient documentation

## 2018-07-11 LAB — BASIC METABOLIC PANEL
Anion gap: 9 (ref 5–15)
BUN: 22 mg/dL (ref 8–23)
CO2: 26 mmol/L (ref 22–32)
Calcium: 8.8 mg/dL — ABNORMAL LOW (ref 8.9–10.3)
Chloride: 105 mmol/L (ref 98–111)
Creatinine, Ser: 0.95 mg/dL (ref 0.61–1.24)
GFR calc Af Amer: >60 mL/min (ref >60)
GFR calc non Af Amer: >60 mL/min (ref >60)
Glucose, Bld: 112 mg/dL — ABNORMAL HIGH (ref 70–99)
Potassium: 4.4 mmol/L (ref 3.5–5.1)
Sodium: 140 mmol/L (ref 135–145)

## 2018-07-11 NOTE — Telephone Encounter (Signed)
Patient informed. Copy sent to PCP °

## 2018-07-11 NOTE — Telephone Encounter (Signed)
-----   Message from Herminio Commons, MD sent at 07/11/2018  3:10 PM EDT ----- Normal renal function

## 2018-07-17 ENCOUNTER — Other Ambulatory Visit: Payer: Self-pay | Admitting: Vascular Surgery

## 2018-07-17 DIAGNOSIS — I7101 Dissection of thoracic aorta: Secondary | ICD-10-CM

## 2018-07-17 DIAGNOSIS — I714 Abdominal aortic aneurysm, without rupture, unspecified: Secondary | ICD-10-CM

## 2018-07-17 DIAGNOSIS — I71019 Dissection of thoracic aorta, unspecified: Secondary | ICD-10-CM

## 2018-07-18 DIAGNOSIS — G4733 Obstructive sleep apnea (adult) (pediatric): Secondary | ICD-10-CM | POA: Diagnosis not present

## 2018-08-02 DIAGNOSIS — G4733 Obstructive sleep apnea (adult) (pediatric): Secondary | ICD-10-CM | POA: Diagnosis not present

## 2018-09-11 ENCOUNTER — Other Ambulatory Visit: Payer: Self-pay | Admitting: Vascular Surgery

## 2018-09-11 DIAGNOSIS — I714 Abdominal aortic aneurysm, without rupture, unspecified: Secondary | ICD-10-CM

## 2018-09-11 DIAGNOSIS — I71019 Dissection of thoracic aorta, unspecified: Secondary | ICD-10-CM

## 2018-09-11 DIAGNOSIS — I7101 Dissection of thoracic aorta: Secondary | ICD-10-CM

## 2018-09-21 DIAGNOSIS — M7022 Olecranon bursitis, left elbow: Secondary | ICD-10-CM | POA: Diagnosis not present

## 2018-09-21 DIAGNOSIS — I2510 Atherosclerotic heart disease of native coronary artery without angina pectoris: Secondary | ICD-10-CM | POA: Diagnosis not present

## 2018-09-21 DIAGNOSIS — G629 Polyneuropathy, unspecified: Secondary | ICD-10-CM | POA: Diagnosis not present

## 2018-09-29 ENCOUNTER — Ambulatory Visit
Admission: RE | Admit: 2018-09-29 | Discharge: 2018-09-29 | Disposition: A | Discharge status: Home / Self Care | Payer: Medicare HMO | Source: Ambulatory Visit | Attending: Vascular Surgery | Admitting: Vascular Surgery

## 2018-09-29 DIAGNOSIS — I7101 Dissection of thoracic aorta: Secondary | ICD-10-CM

## 2018-09-29 DIAGNOSIS — I714 Abdominal aortic aneurysm, without rupture, unspecified: Secondary | ICD-10-CM

## 2018-09-29 DIAGNOSIS — I712 Thoracic aortic aneurysm, without rupture: Secondary | ICD-10-CM | POA: Diagnosis not present

## 2018-09-29 DIAGNOSIS — I71019 Dissection of thoracic aorta, unspecified: Secondary | ICD-10-CM

## 2018-09-29 DIAGNOSIS — K5730 Diverticulosis of large intestine without perforation or abscess without bleeding: Secondary | ICD-10-CM | POA: Diagnosis not present

## 2018-09-29 MED ORDER — IOPAMIDOL (ISOVUE-370) INJECTION 76%
75.0000 mL | Freq: Once | INTRAVENOUS | Status: AC | PRN
  Administered 2018-09-29: 75 mL via INTRAVENOUS

## 2018-10-02 ENCOUNTER — Telehealth: Payer: Self-pay | Admitting: *Deleted

## 2018-10-02 NOTE — Telephone Encounter (Signed)
Virtual Visit Pre-Appointment Phone Call  Today, I spoke with Jonathan Garner. and performed the following actions:  1. I explained that we are currently trying to limit exposure to the COVID-19 virus by seeing patients at home rather than in the office.  I explained that the visits are best done by video, but can be done by telephone.  I asked the patient if a virtual visit that the patient would like to try instead of coming into the office. Jonathan Garner. agreed to proceed with the virtual visit scheduled with Jonathan Garner on 10/02/18.     2. I confirmed the BEST phone number to call the day of the visit and- I included this in appointment notes.  3. I asked if the patient had access to (through a family member/friend) a smartphone with video capability to be used for his visit?"  The patient said yes -    4. I confirmed consent by  a. sending through [place] or by email the [place] as written at the end of this message or  b. verbally as listed below. i. This visit is being performed in the setting of COVID-19. ii. All virtual visits are billed to your insurance company just like a normal visit would be.   iii. We'd like you to understand that the technology does not allow for your provider to perform an examination, and thus may limit your provider's ability to fully assess your condition.  iv. If your provider identifies any concerns that need to be evaluated in person, we will make arrangements to do so.   v. Finally, though the technology is pretty good, we cannot assure that it will always work on either your or our end, and in the setting of a video visit, we may have to convert it to a phone-only visit.  In either situation, we cannot ensure that we have a secure connection.   vi. Are you willing to proceed?"  STAFF: Did the patient verbally acknowledge consent to telehealth visit? Document YES/NO here: YES  2. I advised the patient  to be prepared - I asked that the patient, on the day of his visit, record any information possible with the equipment at his home, such as blood pressure, pulse, oxygen saturation, and your weight and write them all down. I asked the patient to have a pen and paper handy nearby the day of the visit as well.  3. If the patient was scheduled for a video visit, I informed the patient that the visit with the doctor would start with a text to the smartphone # given to [place] by the patient.         If the patient was scheduled for a telephone call, I informed the patient that the visit with the doctor would start with a call to the telephone # given to [place] by the patient.  4. I Informed patient they will receive a phone call 15 minutes prior to their appointment time from a [place] or nurse to review medications, allergies, etc. to prepare for the visit.    TELEPHONE CALL NOTE  Jonathan Swab. has been deemed a candidate for a follow-up tele-health visit to limit community exposure during the Covid-19 pandemic. I spoke with the patient via phone to ensure availability of phone/video source, confirm preferred email & phone number, and discuss instructions and expectations.  I reminded Jonathan Garner. to be prepared with  any vital sign and/or heart rhythm information that could potentially be obtained via home monitoring, at the time of his visit. I reminded Jonathan Garner. to expect a phone call prior to his visit.  Jonathan Garner, NT 10/02/2018 3:07 PM     FULL LENGTH CONSENT FOR TELE-HEALTH VISIT   I hereby voluntarily request, consent and authorize [facility] and its employed or contracted physicians, physician assistants, nurse practitioners or other licensed health care professionals (the Practitioner), to provide me with telemedicine health care services (the "Services") as deemed necessary by the treating Practitioner. I acknowledge and consent to receive the Services by the  Practitioner via telemedicine. I understand that the telemedicine visit will involve communicating with the Practitioner through live audiovisual communication technology and the disclosure of certain medical information by electronic transmission. I acknowledge that I have been given the opportunity to request an in-person assessment or other available alternative prior to the telemedicine visit and am voluntarily participating in the telemedicine visit.  I understand that I have the right to withhold or withdraw my consent to the use of telemedicine in the course of my care at any time, without affecting my right to future care or treatment, and that the Practitioner or I may terminate the telemedicine visit at any time. I understand that I have the right to inspect all information obtained and/or recorded in the course of the telemedicine visit and may receive copies of available information for a reasonable fee.  I understand that some of the potential risks of receiving the Services via telemedicine include:  Marland Kitchen Delay or interruption in medical evaluation due to technological equipment failure or disruption; . Information transmitted may not be sufficient (e.g. poor resolution of images) to allow for appropriate medical decision making by the Practitioner; and/or  . In rare instances, security protocols could fail, causing a breach of personal health information.  Furthermore, I acknowledge that it is my responsibility to provide information about my medical history, conditions and care that is complete and accurate to the best of my ability. I acknowledge that Practitioner's advice, recommendations, and/or decision may be based on factors not within their control, such as incomplete or inaccurate data provided by me or distortions of diagnostic images or specimens that may result from electronic transmissions. I understand that the practice of medicine is not an exact science and that Practitioner makes  no warranties or guarantees regarding treatment outcomes. I acknowledge that I will receive a copy of this consent concurrently upon execution via email to the email address I last provided but may also request a printed copy by calling the office of [place].    I understand that my insurance will be billed for this visit.   I have read or had this consent read to me. . I understand the contents of this consent, which adequately explains the benefits and risks of the Services being provided via telemedicine.  . I have been provided ample opportunity to ask questions regarding this consent and the Services and have had my questions answered to my satisfaction. . I give my informed consent for the services to be provided through the use of telemedicine in my medical care  By participating in this telemedicine visit I agree to the above.

## 2018-10-03 ENCOUNTER — Encounter: Payer: Self-pay | Admitting: Vascular Surgery

## 2018-10-03 ENCOUNTER — Other Ambulatory Visit: Payer: Self-pay

## 2018-10-03 ENCOUNTER — Ambulatory Visit (INDEPENDENT_AMBULATORY_CARE_PROVIDER_SITE_OTHER): Payer: Medicare HMO | Admitting: Vascular Surgery

## 2018-10-03 VITALS — BP 120/72 | Ht 67.0 in | Wt 245.0 lb

## 2018-10-03 DIAGNOSIS — I714 Abdominal aortic aneurysm, without rupture, unspecified: Secondary | ICD-10-CM

## 2018-10-03 DIAGNOSIS — I712 Thoracic aortic aneurysm, without rupture, unspecified: Secondary | ICD-10-CM

## 2018-10-03 NOTE — Progress Notes (Signed)
Virtual Visit via Telephone Note    I connected with Jonathan Garner. on 10/03/2018 using the [employer] by telephone and verified that I was speaking with the correct person using two identifiers. Patient was located at home and accompanied by no one. I am located at [facility].   The limitations of evaluation and management by telemedicine and the availability of in person appointments have been previously discussed with the patient and are documented in the patients chart. The patient expressed understanding and consented to proceed.  PCP: Glenford Bayley, DO   Chief Complaint: Follow-up stent graft repair thoracic and aortic abdominal aneurysm  History of Present Illness: Jonathan Garner. is a 73 y.o. male with for discussion of CT scan from 09/29/2018 and follow-up of thoracic and abdominal aneurysm stent graft repair.  He reports his main problem is continued dyspnea with exertion.  Reports that he has shortness of breath even putting his socks on in the morning.  History of emphysema and known COPD.  No symptoms referable to thoracic or abdominal aneurysm.  No symptoms referable to lower extremity arterial insufficiency.  Past Medical History:  Diagnosis Date   AAA (abdominal aortic aneurysm) ([facility])    CAD (coronary artery disease)    CHF (congestive heart failure) ([facility])    Diverticulosis    Dyspnea    W/ EXERTION    Gynecomastia    Hx of emphysema    Hyperlipidemia    Hypertension    Lumbar spinal stenosis    OSA (obstructive sleep apnea)    Pedal edema     Past Surgical History:  Procedure Laterality Date   ABDOMINAL AORTIC ANEURYSM REPAIR  2013 and 2018   BACK SURGERY     CARDIAC CATHETERIZATION  04/2016   [place], [place]   ELBOW ARTHROSCOPY Left 2011   NECK SURGERY     THORACIC AORTIC ENDOVASCULAR STENT GRAFT N/A 10/26/2017   Procedure: THORACIC AORTIC ENDOVASCULAR STENT GRAFT WITH ILIAC EXTENSIONS;  Surgeon: Rosetta Posner, MD;  Location: [facility] OR;   Service: Vascular;  Laterality: N/A;  PATIENT WILL NEED SPINAL CORD DRAIN BY ANESTHESIA   VASECTOMY  1976    Current Meds  Medication Sig   albuterol (PROVENTIL HFA;VENTOLIN HFA) 108 (90 Base) MCG/ACT inhaler Inhale 1-2 puffs into the lungs every 6 (six) hours as needed for wheezing or shortness of breath.   aspirin EC 81 MG tablet Take 81 mg by mouth daily.   clopidogrel (PLAVIX) 75 MG tablet Take 75 mg by mouth daily.   ibuprofen (ADVIL,MOTRIN) 200 MG tablet Take 800 mg by mouth every 8 (eight) hours as needed (FOR PAIN.).   labetalol (NORMODYNE) 200 MG tablet Take 200 mg by mouth 2 (two) times daily.   losartan (COZAAR) 50 MG tablet Take 50 mg by mouth daily.    potassium chloride SA (K-DUR,KLOR-CON) 20 MEQ tablet Take 1 tablet (20 mEq total) by mouth daily.    12 system ROS was negative unless otherwise noted in HPI   Observations/Objective: CT scan from 09/29/2018 was reviewed and discussed with the patient.  This actually shows a diminished size of his thoracic aneurysm from 5.4 cm last September to 4.7 cm now.  Also has a good seal in his infrarenal stent graft repair with no evidence of type I endoleak.  Assessment and Plan:  Stable overall.  We will see him again in 1 year with repeat CT of his chest abdomen and pelvis Follow Up Instructions: 1 year  I discussed the assessment and treatment plan with the patient. The patient was provided an opportunity to ask questions and all were answered. The patient agreed with the plan and demonstrated an understanding of the instructions.   The patient was advised to call back or seek an in-person evaluation if the symptoms worsen or if the condition fails to improve as anticipated.  I spent 5-10 minutes with the patient via telephone encounter.   Annamary Rummage [facility] Office: [phone]  10/03/2018, 9:12 AM

## 2018-10-05 DIAGNOSIS — M7022 Olecranon bursitis, left elbow: Secondary | ICD-10-CM | POA: Diagnosis not present

## 2018-12-11 DIAGNOSIS — R69 Illness, unspecified: Secondary | ICD-10-CM | POA: Diagnosis not present

## 2019-01-05 ENCOUNTER — Other Ambulatory Visit: Payer: Self-pay

## 2019-01-05 DIAGNOSIS — Z20822 Contact with and (suspected) exposure to covid-19: Secondary | ICD-10-CM

## 2019-01-08 LAB — NOVEL CORONAVIRUS, NAA: SARS-CoV-2, NAA: NOT DETECTED

## 2019-01-29 DIAGNOSIS — R600 Localized edema: Secondary | ICD-10-CM | POA: Diagnosis not present

## 2019-01-29 DIAGNOSIS — I5032 Chronic diastolic (congestive) heart failure: Secondary | ICD-10-CM | POA: Diagnosis not present

## 2019-03-26 ENCOUNTER — Ambulatory Visit (INDEPENDENT_AMBULATORY_CARE_PROVIDER_SITE_OTHER): Payer: Medicare HMO | Admitting: Neurology

## 2019-03-26 ENCOUNTER — Other Ambulatory Visit: Payer: Self-pay

## 2019-03-26 ENCOUNTER — Encounter: Payer: Medicare HMO | Admitting: Neurology

## 2019-03-26 DIAGNOSIS — Z0289 Encounter for other administrative examinations: Secondary | ICD-10-CM

## 2019-03-26 DIAGNOSIS — R202 Paresthesia of skin: Secondary | ICD-10-CM | POA: Insufficient documentation

## 2019-03-26 NOTE — Procedures (Signed)
Full Name: Jonathan Garner Gender: Male MRN #: [id number] Date of Birth: 06/04/1945    Visit Date: 03/26/2019 08:55 Age: 74 Years Examining Physician: Marcial Pacas, MD  Referring Physician: Gillermina Hu, DO History: 74 year old male, had a history of lumbar decompression surgery in [place], presented with 2 years history of gradual onset slow worsening bilateral feet paresthesia, gait abnormality, shortness of breath with exertion  On examination: He has central obesity, shortness of breath with minimum exertion, bilateral upper and lower extremity motor strength is normal, length dependent decreased to vibratory sensation, pinprick to mid shin level.  Absent ankle reflexes.  Wide-based, cautious gait, has difficulty perform tandem walking, able to stand up on tiptoes and heels, Romberg signs was negative  Summary of the tests:  Nerve conduction study: Right sural, bilateral superficial peroneal sensory responses were absent.  Right ulnar sensory response showed significantly decreased snap amplitude.  Right radial sensory response was normal.  Bilateral peroneal to EDB motor response showed mild to moderately decreased CMAP amplitude.  Right tibial motor response showed moderately decreased CMAP amplitude with mild slow conduction velocity.  Electromyography: Selected needle examination was performed at bilateral lower extremity muscles, bilateral lumbosacral paraspinal muscles; and right upper extremity muscle.  The only abnormality is increased insertional activity, enlarged complex motor unit potential at the right abductor hallucis.  Conclusion: This is an abnormal study.  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  There is no evidence of bilateral lumbar radiculopathy.    ------------------------------- Marcial Pacas, M.D. PhD  [facility] [place], [place] [address] Tel:  [phone] Fax: [phone]         [facility]    Nerve / Sites Muscle Latency Ref. Amplitude Ref. Rel Amp Segments Distance Velocity Ref. Area    ms ms mV mV %  cm m/s m/s mVms  R Peroneal - EDB     Ankle EDB 5.0 ?6.5 1.9 ?2.0 100 Ankle - EDB 9   6.6     Fib head EDB 11.4  1.6  82.8 Fib head - Ankle 28 44 ?44 6.9     Pop fossa EDB 13.6  1.6  102 Pop fossa - Fib head 10 44 ?44 7.0         Pop fossa - Ankle      L Peroneal - EDB     Ankle EDB 5.6 ?6.5 0.9 ?2.0 100 Ankle - EDB 9   2.8     Fib head EDB 12.0  0.8  89.4 Fib head - Ankle 28 44 ?44 2.5     Pop fossa EDB 14.3  0.8  90.9 Pop fossa - Fib head 10 44 ?44 2.1         Pop fossa - Ankle 0 0    R Tibial - AH     Ankle AH 4.8 ?5.8 1.6 ?4.0 100 Ankle - AH 9   7.4     Pop fossa AH 15.7  0.9  55.2 Pop fossa - Ankle 38 35 ?41 6.2           SNC    Nerve / Sites Rec. Site Peak Lat Ref.  Amp Ref. Segments Distance    ms ms V V  cm  R Radial - Anatomical snuff box (Forearm)     Forearm Wrist 2.5 ?2.9 16 ?15 Forearm - Wrist 10  R Sural - Ankle (Calf)     Calf Ankle NR ?  4.4 NR ?6 Calf - Ankle 14  R Superficial peroneal - Ankle     Lat leg Ankle NR ?4.4 NR ?6 Lat leg - Ankle 14  L Superficial peroneal - Ankle     Lat leg Ankle NR ?4.4 NR ?6 Lat leg - Ankle 14  R Ulnar - Orthodromic (Dig V, Mid palm)     Dig II Wrist 3.3 ?3.4 3 ?10 Dig II - Wrist 32               F  Wave    Nerve F Lat Ref.   ms ms  R Tibial - AH 60.2 ?56.0       EMG Summary Table    Spontaneous MUAP Recruitment  Muscle IA Fib PSW Fasc Other Amp Dur. Poly Pattern  R. Tibialis anterior Normal None None None _______ Normal Normal Normal Normal  R. Tibialis posterior Normal None None None _______ Normal Normal Normal Normal  R. Peroneus longus Normal None None None _______ Normal Normal Normal Normal  R. Gastrocnemius (Medial head) Normal None None None _______ Normal Normal Normal Normal  R. Abductor hallucis Increased None None None _______ Increased Increased  Normal Reduced  R. Lumbar paraspinals (low) Normal None None None _______ Normal Normal Normal Normal  R. Lumbar paraspinals (mid) Normal None None None _______ Normal Normal Normal Normal  R. Vastus lateralis Normal None None None _______ Normal Normal Normal Normal  L. Tibialis anterior Normal None None None _______ Normal Normal Normal Normal  L. Tibialis posterior Normal None None None _______ Normal Normal Normal Normal  L. Peroneus longus Normal None None None _______ Normal Normal Normal Normal  L. Gastrocnemius (Medial head) Normal None None None _______ Normal Normal Normal Normal  L. Vastus lateralis Normal None None None _______ Normal Normal Normal Normal  L. Lumbar paraspinals (mid) Normal None None None _______ Normal Normal Normal Normal  L. Lumbar paraspinals (low) Normal None None None _______ Normal Normal Normal Normal  R. First dorsal interosseous Normal None None None _______ Normal Normal Normal Normal  R. Pronator teres Normal None None None _______ Normal Normal Normal Normal  R. Extensor digitorum communis Normal None None None _______ Normal Normal Normal Normal

## 2019-03-30 DIAGNOSIS — M25551 Pain in right hip: Secondary | ICD-10-CM | POA: Diagnosis not present

## 2019-03-30 DIAGNOSIS — E782 Mixed hyperlipidemia: Secondary | ICD-10-CM | POA: Diagnosis not present

## 2019-03-30 DIAGNOSIS — M25552 Pain in left hip: Secondary | ICD-10-CM | POA: Diagnosis not present

## 2019-03-30 DIAGNOSIS — I2510 Atherosclerotic heart disease of native coronary artery without angina pectoris: Secondary | ICD-10-CM | POA: Diagnosis not present

## 2019-03-30 DIAGNOSIS — I10 Essential (primary) hypertension: Secondary | ICD-10-CM | POA: Diagnosis not present

## 2019-03-30 DIAGNOSIS — Z6839 Body mass index (BMI) 39.0-39.9, adult: Secondary | ICD-10-CM | POA: Diagnosis not present

## 2019-03-30 DIAGNOSIS — I5031 Acute diastolic (congestive) heart failure: Secondary | ICD-10-CM | POA: Diagnosis not present

## 2019-03-30 DIAGNOSIS — G6289 Other specified polyneuropathies: Secondary | ICD-10-CM | POA: Diagnosis not present

## 2019-04-13 DIAGNOSIS — M7022 Olecranon bursitis, left elbow: Secondary | ICD-10-CM | POA: Diagnosis not present

## 2019-04-13 DIAGNOSIS — Z1211 Encounter for screening for malignant neoplasm of colon: Secondary | ICD-10-CM | POA: Diagnosis not present

## 2019-04-13 DIAGNOSIS — E538 Deficiency of other specified B group vitamins: Secondary | ICD-10-CM | POA: Diagnosis not present

## 2019-04-27 DIAGNOSIS — G4733 Obstructive sleep apnea (adult) (pediatric): Secondary | ICD-10-CM | POA: Diagnosis not present

## 2019-05-02 DIAGNOSIS — R300 Dysuria: Secondary | ICD-10-CM | POA: Diagnosis not present

## 2019-05-11 DIAGNOSIS — E538 Deficiency of other specified B group vitamins: Secondary | ICD-10-CM | POA: Diagnosis not present

## 2019-05-28 DIAGNOSIS — G4733 Obstructive sleep apnea (adult) (pediatric): Secondary | ICD-10-CM | POA: Diagnosis not present

## 2019-06-27 DIAGNOSIS — G4733 Obstructive sleep apnea (adult) (pediatric): Secondary | ICD-10-CM | POA: Diagnosis not present

## 2019-06-28 ENCOUNTER — Other Ambulatory Visit: Payer: Self-pay | Admitting: Cardiovascular Disease

## 2019-07-09 ENCOUNTER — Ambulatory Visit: Payer: Medicare HMO | Admitting: Cardiovascular Disease

## 2019-07-18 DIAGNOSIS — I5031 Acute diastolic (congestive) heart failure: Secondary | ICD-10-CM | POA: Diagnosis not present

## 2019-07-19 ENCOUNTER — Other Ambulatory Visit: Payer: Self-pay | Admitting: Cardiovascular Disease

## 2019-07-19 MED ORDER — TORSEMIDE 20 MG PO TABS: 20.0000 mg | ORAL_TABLET | Freq: Two times a day (BID) | ORAL | 6 refills | Status: DC

## 2019-07-19 NOTE — Telephone Encounter (Signed)
Refilled to [facility] 

## 2019-07-19 NOTE — Telephone Encounter (Signed)
*  STAT* If patient is at the pharmacy, call can be transferred to refill team.   1. Which medications need to be refilled?  torsemide (DEMADEX) 20 MG tablet    2. Which pharmacy/location (including street and city if local pharmacy) is medication to be sent to? [place]  3. Do they need a 30 day or 90 day supply?    OUT OF THE MEDICATION

## 2019-08-06 DIAGNOSIS — G4733 Obstructive sleep apnea (adult) (pediatric): Secondary | ICD-10-CM | POA: Diagnosis not present

## 2019-08-29 ENCOUNTER — Ambulatory Visit: Payer: Medicare HMO | Admitting: Cardiovascular Disease

## 2019-08-30 ENCOUNTER — Ambulatory Visit (INDEPENDENT_AMBULATORY_CARE_PROVIDER_SITE_OTHER): Payer: Medicare HMO | Admitting: Cardiology

## 2019-08-30 ENCOUNTER — Encounter: Payer: Self-pay | Admitting: Cardiology

## 2019-08-30 VITALS — BP 120/64 | HR 72 | Ht 67.0 in | Wt 247.2 lb

## 2019-08-30 DIAGNOSIS — R0602 Shortness of breath: Secondary | ICD-10-CM | POA: Diagnosis not present

## 2019-08-30 DIAGNOSIS — I10 Essential (primary) hypertension: Secondary | ICD-10-CM | POA: Diagnosis not present

## 2019-08-30 DIAGNOSIS — I5033 Acute on chronic diastolic (congestive) heart failure: Secondary | ICD-10-CM

## 2019-08-30 MED ORDER — TORSEMIDE 20 MG PO TABS: 60.0000 mg | ORAL_TABLET | Freq: Every day | ORAL | 6 refills | Status: DC

## 2019-08-30 NOTE — Patient Instructions (Signed)
Your physician recommends that you schedule a follow-up appointment in: [place] physician has recommended you make the following change in your medication:   INCREASE TORSEMIDE 60 MG (3 TABLETS) DAILY   Your physician recommends that you return for lab work [place] BMP/MG  Your physician has requested that you have an echocardiogram. Echocardiography is a painless test that uses sound waves to create images of your heart. It provides your doctor with information about the size and shape of your heart and how well your heart's chambers and valves are working. This procedure takes approximately one hour. There are no restrictions for this procedure.   Thank you for choosing [facility]!!

## 2019-08-30 NOTE — Progress Notes (Signed)
Clinical Summary Jonathan Garner is a 74 y.o.male seen today for follow up of the following meidcal problema.   1. Chronic diastolic HF - DOE just walking in from parking lot - some recent LE edema. Compliant with torsemide. Limiting sodium intake.  - no chest pains. No cough or wheezing - no orthopnea, wears cpap  2. History of thoracic and abdominal aneurysm repair - followed by vascular   3. OSA - has cpap at home, 2-3 years - previously Jonathan Garner     He is on DAPT, I am unclear indication.     Past Medical History:  Diagnosis Date  . AAA (abdominal aortic aneurysm) ([place])   . CAD (coronary artery disease)   . CHF (congestive heart failure) ([place])   . Diverticulosis   . Dyspnea    W/ EXERTION   . Gynecomastia   . Hx of emphysema   . Hyperlipidemia   . Hypertension   . Lumbar spinal stenosis   . OSA (obstructive sleep apnea)   . Pedal edema      No Known Allergies   Current Outpatient Medications  Medication Sig Dispense Refill  . albuterol (PROVENTIL HFA;VENTOLIN HFA) 108 (90 Base) MCG/ACT inhaler Inhale 1-2 puffs into the lungs every 6 (six) hours as needed for wheezing or shortness of breath. 1 Inhaler 2  . aspirin EC 81 MG tablet Take 81 mg by mouth daily.    . clopidogrel (PLAVIX) 75 MG tablet Take 75 mg by mouth daily.    Marland Kitchen ibuprofen (ADVIL,MOTRIN) 200 MG tablet Take 800 mg by mouth every 8 (eight) hours as needed (FOR PAIN.).    Marland Kitchen labetalol (NORMODYNE) 200 MG tablet Take 200 mg by mouth 2 (two) times daily.    Marland Kitchen losartan (COZAAR) 50 MG tablet Take 50 mg by mouth daily.   0  . potassium chloride SA (K-DUR,KLOR-CON) 20 MEQ tablet Take 1 tablet (20 mEq total) by mouth daily. 30 tablet 6  . torsemide (DEMADEX) 20 MG tablet Take 1 tablet (20 mg total) by mouth 2 (two) times daily. 60 tablet 6   No current facility-administered medications for this visit.     Past Surgical History:  Procedure Laterality Date  . ABDOMINAL AORTIC ANEURYSM REPAIR  2013 and  2018  . BACK SURGERY    . CARDIAC CATHETERIZATION  04/2016   [place]  . ELBOW ARTHROSCOPY Left 2011  . NECK SURGERY    . THORACIC AORTIC ENDOVASCULAR STENT GRAFT N/A 10/26/2017   Procedure: THORACIC AORTIC ENDOVASCULAR STENT GRAFT WITH ILIAC EXTENSIONS;  Surgeon: Rosetta Posner, MD;  Location: [facility] OR;  Service: Vascular;  Laterality: N/A;  PATIENT WILL NEED SPINAL CORD DRAIN BY ANESTHESIA  . VASECTOMY  1976     No Known Allergies    Family History  Problem Relation Age of Onset  . Alzheimer's disease Mother   . Heart attack Father   . Leukemia Father   . Hypertension Father      Social History Mr. Minnehan reports that he quit smoking about 2 years ago. His smoking use included cigars. He has never used smokeless tobacco. Mr. Kotlyar reports current alcohol use.   Review of Systems CONSTITUTIONAL: No weight loss, fever, chills, weakness or fatigue.  HEENT: Eyes: No visual loss, blurred vision, double vision or yellow sclerae.No hearing loss, sneezing, congestion, runny nose or sore throat.  SKIN: No rash or itching.  CARDIOVASCULAR: pe rhpi RESPIRATORY: per hpi GASTROINTESTINAL: No anorexia, nausea, vomiting or diarrhea. No abdominal  pain or blood.  GENITOURINARY: No burning on urination, no polyuria NEUROLOGICAL: No headache, dizziness, syncope, paralysis, ataxia, numbness or tingling in the extremities. No change in bowel or bladder control.  MUSCULOSKELETAL: No muscle, back pain, joint pain or stiffness.  LYMPHATICS: No enlarged nodes. No history of splenectomy.  PSYCHIATRIC: No history of depression or anxiety.  ENDOCRINOLOGIC: No reports of sweating, cold or heat intolerance. No polyuria or polydipsia.  Marland Kitchen   Physical Examination Today's Vitals   08/30/19 0858  BP: 120/64  Pulse: 72  SpO2: 98%  Weight: 247 lb 3.2 oz (112.1 kg)  Height: 5\' 7"  (1.702 m)   Body mass index is 38.72 kg/m.  Gen: resting comfortably, no acute distress HEENT: no scleral  icterus, pupils equal round and reactive, no palptable cervical adenopathy,  CV: RRR, no m/r/g, no jvd Resp:faint crackles bilateral bases GI: abdomen is soft, non-tender, non-distended, normal bowel sounds, no hepatosplenomegaly MSK: extremities are warm, 1+ bialteral LE edema Skin: warm, no rash Neuro:  no focal deficits Psych: appropriate affect     Assessment and Plan  1. Acute on chronc diastolic HF - volume overloaded on exam, remains symptomatic - increase torsemide to 60mg  daily, he prefers once daily as opposed to bid dosing - check BMET/Mg 1 week - repeat echo given progression of symptoms  2. LBBB - noted on EKG in clinic today, new since his last EKG in our system in 2019 - no chest pains but has SOB - f/u echo, pending results may need to consider ischemic evaluation      Arnoldo Lenis, M.D

## 2019-09-03 ENCOUNTER — Other Ambulatory Visit: Payer: Self-pay

## 2019-09-03 DIAGNOSIS — I712 Thoracic aortic aneurysm, without rupture, unspecified: Secondary | ICD-10-CM

## 2019-09-05 ENCOUNTER — Other Ambulatory Visit: Payer: Self-pay

## 2019-09-05 ENCOUNTER — Other Ambulatory Visit (HOSPITAL_COMMUNITY)
Admission: RE | Admit: 2019-09-05 | Discharge: 2019-09-05 | Disposition: A | Discharge status: Home / Self Care | Payer: Medicare HMO | Source: Ambulatory Visit | Attending: Cardiology | Admitting: Cardiology

## 2019-09-05 DIAGNOSIS — R0602 Shortness of breath: Secondary | ICD-10-CM | POA: Diagnosis not present

## 2019-09-05 LAB — MAGNESIUM: Magnesium: 2.5 mg/dL — ABNORMAL HIGH (ref 1.7–2.4)

## 2019-09-05 LAB — BASIC METABOLIC PANEL
Anion gap: 10 (ref 5–15)
BUN: 25 mg/dL — ABNORMAL HIGH (ref 8–23)
CO2: 24 mmol/L (ref 22–32)
Calcium: 8.9 mg/dL (ref 8.9–10.3)
Chloride: 105 mmol/L (ref 98–111)
Creatinine, Ser: 1 mg/dL (ref 0.61–1.24)
GFR calc Af Amer: >60 mL/min (ref >60)
GFR calc non Af Amer: >60 mL/min (ref >60)
Glucose, Bld: 117 mg/dL — ABNORMAL HIGH (ref 70–99)
Potassium: 4.1 mmol/L (ref 3.5–5.1)
Sodium: 139 mmol/L (ref 135–145)

## 2019-09-11 ENCOUNTER — Ambulatory Visit (INDEPENDENT_AMBULATORY_CARE_PROVIDER_SITE_OTHER): Payer: Medicare HMO

## 2019-09-11 DIAGNOSIS — R0602 Shortness of breath: Secondary | ICD-10-CM

## 2019-09-11 LAB — ECHOCARDIOGRAM COMPLETE
AR max vel: 2.14 cm2
AV Area VTI: 2.33 cm2
AV Area mean vel: 2.02 cm2
AV Mean grad: 5.1 mmHg
AV Peak grad: 9.1 mmHg
Ao pk vel: 1.51 m/s
Area-P 1/2: 3.83 cm2
Calc EF: 42.2 %
MV M vel: 2 m/s
MV Peak grad: 15.9 mmHg
S' Lateral: 4.33 cm
Single Plane A2C EF: 49.4 %
Single Plane A4C EF: 32.1 %

## 2019-09-17 ENCOUNTER — Telehealth: Payer: Self-pay | Admitting: *Deleted

## 2019-09-17 DIAGNOSIS — R0602 Shortness of breath: Secondary | ICD-10-CM

## 2019-09-17 NOTE — Telephone Encounter (Signed)
-----   Message from Arnoldo Lenis, MD sent at 09/13/2019  3:21 PM EDT ----- Needs limited echo with contrast to get better views of the heart, please arrange at [facility] MD

## 2019-09-17 NOTE — Telephone Encounter (Signed)
Laurine Blazer, LPN  06/22/636 6:85 PM EDT Back to Top    Notified, copy to pcp. He agrees to doing the limited echo with contrast at [facility]. Follow up scheduled for 10/01/2019 with Katina Dung, NP.

## 2019-09-27 ENCOUNTER — Other Ambulatory Visit: Payer: Self-pay

## 2019-09-27 ENCOUNTER — Ambulatory Visit (HOSPITAL_COMMUNITY)
Admission: RE | Admit: 2019-09-27 | Discharge: 2019-09-27 | Disposition: A | Discharge status: Home / Self Care | Payer: Medicare HMO | Source: Ambulatory Visit | Attending: Cardiology | Admitting: Cardiology

## 2019-09-27 DIAGNOSIS — R0602 Shortness of breath: Secondary | ICD-10-CM | POA: Insufficient documentation

## 2019-09-27 MED ORDER — PERFLUTREN LIPID MICROSPHERE
1.0000 mL | INTRAVENOUS | Status: AC | PRN
  Administered 2019-09-27: 1 mL via INTRAVENOUS
  Administered 2019-09-27: 2 mL via INTRAVENOUS
  Administered 2019-09-27: 1 mL via INTRAVENOUS

## 2019-09-27 MED ORDER — PERFLUTREN LIPID MICROSPHERE
1.0000 mL | INTRAVENOUS | Status: DC | PRN
  Filled 2019-09-27: qty 10

## 2019-09-27 NOTE — Progress Notes (Signed)
*  PRELIMINARY RESULTS* Echocardiogram Limited 2-D Echocardiogram has been performed with [facility].  Samuel Germany 09/27/2019, 10:25 AM

## 2019-09-27 NOTE — Progress Notes (Signed)
Cardiology Office Note  Date: 09/28/2019   ID: Jonathan Nordmann., DOB 03/17/1945, MRN [id number]  PCP:  Carolee Rota, NP  Cardiologist:  Carlyle Dolly, MD Electrophysiologist:  None   Chief Complaint: Follow-up acute on chronic diastolic heart failure.  History of Present Illness: Jonathan [place]. is a 74 y.o. male with a history of acute on chronic diastolic heart failure.  History of thoracic and abdominal aneurysm repair, OSA on CPAP, on DAPT therapy, left bundle branch block.  Last visit with Dr. Harl Bowie on 08/30/2019.  He was volume overloaded on exam remained symptomatic. DOE just walking in from parking lot. No CP or orthopnea. Torsemide was increased to 60 mg daily.  He  preferred once daily as opposed to twice daily dosing.  BMP and mag in 1 week and repeat echo.  New left bundle branch block on EKG in clinic on that visit.  This was new since last EKG in 2019.  No chest pain but shortness of breath.  Pending echo results may need to consider ischemic evaluation.  Patient is here for 1 month follow-up.  He continues to complain of dyspnea on exertion.  Denies any chest pain, PND, orthopnea.  He does have obstructive sleep apnea and is compliant with CPAP therapy.  He is a long-term smoker for many years at least a pack per day.  Smokes about 3 cigarettes/day now.  States that shortness of breath seems to be progressing.  Recently had an echocardiogram.  At last visit he was volume overloaded and torsemide was increased to 60 mg daily.  States his breathing is better but still has exertional dyspnea.  He is maintaining his weight around 245 pounds.  Admits to mild lower extremity edema.  Past Medical History:  Diagnosis Date  . AAA (abdominal aortic aneurysm) ([place])   . CAD (coronary artery disease)   . CHF (congestive heart failure) ([place])   . Diverticulosis   . Dyspnea    W/ EXERTION   . Gynecomastia   . Hx of emphysema   . Hyperlipidemia   . Hypertension   .  Lumbar spinal stenosis   . OSA (obstructive sleep apnea)   . Pedal edema     Past Surgical History:  Procedure Laterality Date  . ABDOMINAL AORTIC ANEURYSM REPAIR  2013 and 2018  . BACK SURGERY    . CARDIAC CATHETERIZATION  04/2016   [place], [place]  . ELBOW ARTHROSCOPY Left 2011  . NECK SURGERY    . THORACIC AORTIC ENDOVASCULAR STENT GRAFT N/A 10/26/2017   Procedure: THORACIC AORTIC ENDOVASCULAR STENT GRAFT WITH ILIAC EXTENSIONS;  Surgeon: Rosetta Posner, MD;  Location: [facility] OR;  Service: Vascular;  Laterality: N/A;  PATIENT WILL NEED SPINAL CORD DRAIN BY ANESTHESIA  . VASECTOMY  1976    Current Outpatient Medications  Medication Sig Dispense Refill  . albuterol (PROVENTIL HFA;VENTOLIN HFA) 108 (90 Base) MCG/ACT inhaler Inhale 1-2 puffs into the lungs every 6 (six) hours as needed for wheezing or shortness of breath. 1 Inhaler 2  . aspirin EC 81 MG tablet Take 81 mg by mouth daily.    . clopidogrel (PLAVIX) 75 MG tablet Take 75 mg by mouth daily.    Marland Kitchen ibuprofen (ADVIL,MOTRIN) 200 MG tablet Take 800 mg by mouth every 8 (eight) hours as needed (FOR PAIN.).    Marland Kitchen labetalol (NORMODYNE) 200 MG tablet Take 200 mg by mouth 2 (two) times daily.    Marland Kitchen losartan (COZAAR) 50 MG tablet Take 50  mg by mouth daily.   0  . potassium chloride SA (K-DUR,KLOR-CON) 20 MEQ tablet Take 1 tablet (20 mEq total) by mouth daily. 30 tablet 6  . torsemide (DEMADEX) 20 MG tablet Take 3 tablets (60 mg total) by mouth daily. 90 tablet 6   No current facility-administered medications for this visit.   Allergies:  Patient has no known allergies.   Social History: The patient  reports that he quit smoking about 2 years ago. His smoking use included cigars. He has never used smokeless tobacco. He reports current alcohol use. He reports that he does not use drugs.   Family History: The patient's family history includes Alzheimer's disease in his mother; Heart attack in his father; Hypertension in his father; Leukemia in his  father.   ROS:  Please see the history of present illness. Otherwise, complete review of systems is positive for none.  All other systems are reviewed and negative.   Physical Exam: VS:  BP (!) 100/40   Pulse 78   Ht 5\' 7"  (1.702 m)   Wt 245 lb (111.1 kg)   SpO2 96%   BMI 38.37 kg/m , BMI Body mass index is 38.37 kg/m.   Wt Readings from Last 3 Encounters:  09/28/19 245 lb (111.1 kg)  08/30/19 247 lb 3.2 oz (112.1 kg)  10/03/18 245 lb (111.1 kg)    General: Patient appears comfortable at rest. Neck: Supple, no elevated JVP or carotid bruits, no thyromegaly. Lungs: Has some mild inspiratory and expiratory wheezing bilaterally throughout, nonlabored breathing at rest. Cardiac: Regular rate and rhythm, no S3 or significant systolic murmur, no pericardial rub. Extremities: No pitting edema, distal pulses 2+. Skin: Warm and dry. Musculoskeletal: No kyphosis. Neuropsychiatric: Alert and oriented x3, affect grossly appropriate.  ECG:    Recent Labwork: 09/05/2019: BUN 25; Creatinine, Ser 1.00; Magnesium 2.5; Potassium 4.1; Sodium 139  No results found for: CHOL, TRIG, HDL, CHOLHDL, VLDL, LDLCALC, LDLDIRECT  Other Studies Reviewed Today:  Echocardiogram 09/27/2019  1. Left ventricular ejection fraction, by estimation, is 50%. The left ventricle has normal function. The left ventricle has no regional wall motion abnormalities. 2. Limited echo with contrast to evaluate LV function.    Echocardiogram  09/11/2019  Demonstrating very rough estimate of LVEF of low normal 50%.  Mild LVH, G1 DD, mild MR  Assessment and Plan:  1. Acute on chronic diastolic heart failure ([place])   2. SOB (shortness of breath)   3. Essential hypertension   4. Smoker    1. Acute on chronic diastolic heart failure (H[facility]) Recent echo with EF of 50%, mild LVH, G1 DD, mild MR. Patient was fluid overloaded at last visit and started on torsemide.  States his torsemide is helping but he continues to complain of  shortness of breath.  Weight is staying steady around 245 pounds.  Has some mild pitting lower extremity edema.  Advised patient to restrict fluid intake to around 60 ounces per day.  Weigh every day.  If weight gain of 3 pounds in 1 day or 5 pounds in 1 week notify us.  Restrict salt intake.  Continue torsemide 60 mg daily.  2. SOB (shortness of breath) Continues with dyspnea on exertion.  He admits to a long history of smoking at least a pack per day but recently smoking approximately 3 cigarettes daily.  He likely needs a pulmonary evaluation.  Please refer to pulmonary clinic in [place].   3. Essential hypertension Blood pressure is low normal at 100/40.  He denies any orthostatic symptoms.  4.  Smoking Long history of smoking from a young age.  States recently only smoking around 3 cigarettes/day.  Advised cessation.   Medication Adjustments/Labs and Tests Ordered: Current medicines are reviewed at length with the patient today.  Concerns regarding medicines are outlined above.   Disposition: Follow-up with Dr. Harl Bowie or APP in 3 months.  Signed, Levell July, NP 09/28/2019 11:10 AM    [facility] [place], [place], [place] [address] Phone: [phone]; Fax: [phone]

## 2019-09-28 ENCOUNTER — Encounter: Payer: Self-pay | Admitting: Family Medicine

## 2019-09-28 ENCOUNTER — Ambulatory Visit: Payer: Medicare HMO | Admitting: Family Medicine

## 2019-09-28 VITALS — BP 100/40 | HR 78 | Ht 67.0 in | Wt 245.0 lb

## 2019-09-28 DIAGNOSIS — I10 Essential (primary) hypertension: Secondary | ICD-10-CM

## 2019-09-28 DIAGNOSIS — I5033 Acute on chronic diastolic (congestive) heart failure: Secondary | ICD-10-CM | POA: Diagnosis not present

## 2019-09-28 DIAGNOSIS — F17200 Nicotine dependence, unspecified, uncomplicated: Secondary | ICD-10-CM | POA: Diagnosis not present

## 2019-09-28 DIAGNOSIS — R69 Illness, unspecified: Secondary | ICD-10-CM | POA: Diagnosis not present

## 2019-09-28 DIAGNOSIS — R0602 Shortness of breath: Secondary | ICD-10-CM

## 2019-09-28 NOTE — Patient Instructions (Signed)
Medication Instructions:  Your physician recommends that you continue on your current medications as directed. Please refer to the Current Medication list given to you today.  *If you need a refill on your cardiac medications before your next appointment, please call your pharmacy*   Lab Work: None If you have labs (blood work) drawn today and your tests are completely normal, you will receive your results only by:  [place] (if you have MyChart) OR  A paper copy in the mail If you have any lab test that is abnormal or we need to change your treatment, we will call you to review the results.   Testing/Procedures: None   Follow-Up: At [facility], you and your health needs are our priority.  As part of our continuing mission to provide you with exceptional heart care, we have created designated Provider Care Teams.  These Care Teams include your primary Cardiologist (physician) and Advanced Practice Providers (APPs -  Physician Assistants and Nurse Practitioners) who all work together to provide you with the care you need, when you need it.  We recommend signing up for the patient portal called "MyChart".  Sign up information is provided on this After Visit Summary.  MyChart is used to connect with patients for Virtual Visits (Telemedicine).  Patients are able to view lab/test results, encounter notes, upcoming appointments, etc.  Non-urgent messages can be sent to your provider as well.   To learn more about what you can do with MyChart, go to [email or web address].    Your next appointment:   3 month(s)  The format for your next appointment:   In Person  Provider:   You may see Carlyle Dolly, MD or the following Advanced Practice Provider on your designated Care Team:    Katina Dung, NP    Other Instructions You have been referred to a Pulmonary Dr. Deborha Payment will call you to schedule an appointment.

## 2019-10-01 ENCOUNTER — Ambulatory Visit: Payer: Medicare HMO | Admitting: Family Medicine

## 2019-10-04 MED FILL — Perflutren Lipid Microsphere IV Susp 1.1 MG/ML: INTRAVENOUS | Qty: 10 | Status: AC

## 2019-10-08 ENCOUNTER — Other Ambulatory Visit: Payer: Self-pay

## 2019-10-08 ENCOUNTER — Ambulatory Visit
Admission: RE | Admit: 2019-10-08 | Discharge: 2019-10-08 | Disposition: A | Discharge status: Home / Self Care | Payer: Medicare HMO | Source: Ambulatory Visit | Attending: Vascular Surgery | Admitting: Vascular Surgery

## 2019-10-08 DIAGNOSIS — I712 Thoracic aortic aneurysm, without rupture, unspecified: Secondary | ICD-10-CM

## 2019-10-08 DIAGNOSIS — I716 Thoracoabdominal aortic aneurysm, without rupture: Secondary | ICD-10-CM | POA: Diagnosis not present

## 2019-10-08 DIAGNOSIS — I2510 Atherosclerotic heart disease of native coronary artery without angina pectoris: Secondary | ICD-10-CM | POA: Diagnosis not present

## 2019-10-08 DIAGNOSIS — I723 Aneurysm of iliac artery: Secondary | ICD-10-CM | POA: Diagnosis not present

## 2019-10-08 DIAGNOSIS — I700 Atherosclerosis of aorta: Secondary | ICD-10-CM | POA: Diagnosis not present

## 2019-10-08 MED ORDER — IOPAMIDOL (ISOVUE-370) INJECTION 76%
75.0000 mL | Freq: Once | INTRAVENOUS | Status: AC | PRN
  Administered 2019-10-08: 75 mL via INTRAVENOUS

## 2019-10-09 ENCOUNTER — Ambulatory Visit (INDEPENDENT_AMBULATORY_CARE_PROVIDER_SITE_OTHER): Payer: Medicare HMO | Admitting: Vascular Surgery

## 2019-10-09 ENCOUNTER — Encounter: Payer: Self-pay | Admitting: Vascular Surgery

## 2019-10-09 DIAGNOSIS — I712 Thoracic aortic aneurysm, without rupture, unspecified: Secondary | ICD-10-CM

## 2019-10-09 DIAGNOSIS — I714 Abdominal aortic aneurysm, without rupture, unspecified: Secondary | ICD-10-CM

## 2019-10-09 NOTE — Progress Notes (Signed)
Virtual Visit via Telephone Note    I connected with Jonathan Garner. on 10/09/2019  by telephone and verified that I was speaking with the correct person using two identifiers. Patient was located at home and accompanied by no one. I am located at [facility].   The limitations of evaluation and management by telemedicine and the availability of in person appointments have been previously discussed with the patient and are documented in the patients chart. The patient expressed understanding and consented to proceed.  PCP: Carolee Rota, NP  Chief Complaint: Follow-up thoracic and abdominal aneurysm  History of Present Illness: Jonathan Garner. is a 74 y.o. male with history of abdominal aortic aneurysm treated with stent graft in [place] initially with a EVAR in 2014 and then endovascular treatment with stent graft extension in 2018.  He moved from [place] and was seen with thoracic aneurysm with treatment by myself on 10/26/2017 with a thoracic endograft.  He underwent CT scan yesterday and we are discussing his findings today.  His main complaint continues to be severe shortness of breath with exertion.  He had a 2D echocardiogram which was normal ejection fracture.  He apparently is being scheduled for pulmonary function tests and pulmonary medicine evaluation  Past Medical History:  Diagnosis Date  . AAA (abdominal aortic aneurysm) ([place])   . CAD (coronary artery disease)   . CHF (congestive heart failure) ([place])   . Diverticulosis   . Dyspnea    W/ EXERTION   . Gynecomastia   . Hx of emphysema   . Hyperlipidemia   . Hypertension   . Lumbar spinal stenosis   . OSA (obstructive sleep apnea)   . Pedal edema     Past Surgical History:  Procedure Laterality Date  . ABDOMINAL AORTIC ANEURYSM REPAIR  2013 and 2018  . BACK SURGERY    . CARDIAC CATHETERIZATION  04/2016   [place], [place]  . ELBOW ARTHROSCOPY Left 2011  . NECK SURGERY    . THORACIC AORTIC ENDOVASCULAR STENT  GRAFT N/A 10/26/2017   Procedure: THORACIC AORTIC ENDOVASCULAR STENT GRAFT WITH ILIAC EXTENSIONS;  Surgeon: Rosetta Posner, MD;  Location: [facility] OR;  Service: Vascular;  Laterality: N/A;  PATIENT WILL NEED SPINAL CORD DRAIN BY ANESTHESIA  . VASECTOMY  1976    Current Meds  Medication Sig  . albuterol (PROVENTIL HFA;VENTOLIN HFA) 108 (90 Base) MCG/ACT inhaler Inhale 1-2 puffs into the lungs every 6 (six) hours as needed for wheezing or shortness of breath.  Marland Kitchen aspirin EC 81 MG tablet Take 81 mg by mouth daily.  . clopidogrel (PLAVIX) 75 MG tablet Take 75 mg by mouth daily.  Marland Kitchen ibuprofen (ADVIL,MOTRIN) 200 MG tablet Take 800 mg by mouth every 8 (eight) hours as needed (FOR PAIN.).  Marland Kitchen labetalol (NORMODYNE) 200 MG tablet Take 200 mg by mouth 2 (two) times daily.  Marland Kitchen losartan (COZAAR) 50 MG tablet Take 50 mg by mouth daily.   . potassium chloride SA (K-DUR,KLOR-CON) 20 MEQ tablet Take 1 tablet (20 mEq total) by mouth daily.  Marland Kitchen torsemide (DEMADEX) 20 MG tablet Take 3 tablets (60 mg total) by mouth daily.    12 system ROS was negative unless otherwise noted in HPI   Observations/Objective: CT scan revealed a stable stent graft repair of his thoracic stent graft and his infrarenal stent graft.  No expansion since his last imaging 1 year ago  Assessment and Plan:  Stable status post thoracic and abdominal stent graft repair.  Follow Up Instructions: We will continue full activities.  We will see him again in 1 year with repeat CT chest abdomen and pelvis   I discussed the assessment and treatment plan with the patient. The patient was provided an opportunity to ask questions and all were answered. The patient agreed with the plan and demonstrated an understanding of the instructions.   The patient was advised to call back or seek an in-person evaluation if the symptoms worsen or if the condition fails to improve as anticipated.  I spent 11-15 minutes with the patient via telephone  encounter.   Annamary Rummage [facility] Office: [phone]  10/09/2019, 2:54 PM

## 2019-10-24 ENCOUNTER — Telehealth: Payer: Self-pay | Admitting: Family Medicine

## 2019-10-24 NOTE — Telephone Encounter (Signed)
If toleratring lower dose of torsemide ok to continue the 20mg  daily   Zandra Abts MD

## 2019-10-24 NOTE — Telephone Encounter (Signed)
Pt aware - updated medication list  

## 2019-10-24 NOTE — Telephone Encounter (Signed)
Patient walked in stating that the medication torsemide (DEMADEX) 20 MG tablet  started causing him to have knee and lower back pain. States that he is having cramps in his hands.  He has decreased to 1 pill per day.

## 2019-10-24 NOTE — Telephone Encounter (Signed)
Pt started taking 20 mg torsemide daily for the last 4 days - says his hands were cramping lower back pain where his kidneys are and joint pain - says he has not taken potassium 20 meq in months (says he was constipated taking him) - says he has felt better since decreasing torsemide to 1 tablet daily (20mg ) - denies any swelling or SOB

## 2019-11-15 DIAGNOSIS — M7022 Olecranon bursitis, left elbow: Secondary | ICD-10-CM | POA: Diagnosis not present

## 2019-11-15 DIAGNOSIS — L02419 Cutaneous abscess of limb, unspecified: Secondary | ICD-10-CM | POA: Diagnosis not present

## 2019-11-19 ENCOUNTER — Other Ambulatory Visit (HOSPITAL_COMMUNITY)
Admission: RE | Admit: 2019-11-19 | Discharge: 2019-11-19 | Disposition: A | Discharge status: Home / Self Care | Payer: Medicare HMO | Source: Ambulatory Visit | Attending: Internal Medicine | Admitting: Internal Medicine

## 2019-11-19 ENCOUNTER — Encounter: Payer: Self-pay | Admitting: Internal Medicine

## 2019-11-19 ENCOUNTER — Other Ambulatory Visit: Payer: Self-pay

## 2019-11-19 ENCOUNTER — Ambulatory Visit (INDEPENDENT_AMBULATORY_CARE_PROVIDER_SITE_OTHER): Payer: Medicare HMO | Admitting: Internal Medicine

## 2019-11-19 DIAGNOSIS — R0600 Dyspnea, unspecified: Secondary | ICD-10-CM

## 2019-11-19 DIAGNOSIS — R0609 Other forms of dyspnea: Secondary | ICD-10-CM

## 2019-11-19 DIAGNOSIS — Z9989 Dependence on other enabling machines and devices: Secondary | ICD-10-CM | POA: Diagnosis not present

## 2019-11-19 DIAGNOSIS — F17210 Nicotine dependence, cigarettes, uncomplicated: Secondary | ICD-10-CM | POA: Diagnosis not present

## 2019-11-19 DIAGNOSIS — R69 Illness, unspecified: Secondary | ICD-10-CM | POA: Diagnosis not present

## 2019-11-19 DIAGNOSIS — G4733 Obstructive sleep apnea (adult) (pediatric): Secondary | ICD-10-CM | POA: Diagnosis not present

## 2019-11-19 DIAGNOSIS — J449 Chronic obstructive pulmonary disease, unspecified: Secondary | ICD-10-CM

## 2019-11-19 LAB — CBC WITH DIFFERENTIAL/PLATELET
Abs Immature Granulocytes: 0.01 10*3/uL (ref 0.00–0.07)
Basophils Absolute: 0 10*3/uL (ref 0.0–0.1)
Basophils Relative: 1 %
Eosinophils Absolute: 0.2 10*3/uL (ref 0.0–0.5)
Eosinophils Relative: 3 %
HCT: 38.7 % — ABNORMAL LOW (ref 39.0–52.0)
Hemoglobin: 12.8 g/dL — ABNORMAL LOW (ref 13.0–17.0)
Immature Granulocytes: 0 %
Lymphocytes Relative: 30 %
Lymphs Abs: 2.2 10*3/uL (ref 0.7–4.0)
MCH: 29.4 pg (ref 26.0–34.0)
MCHC: 33.1 g/dL (ref 30.0–36.0)
MCV: 89 fL (ref 80.0–100.0)
Monocytes Absolute: 0.9 10*3/uL (ref 0.1–1.0)
Monocytes Relative: 12 %
Neutro Abs: 3.9 10*3/uL (ref 1.7–7.7)
Neutrophils Relative %: 54 %
Platelets: 285 10*3/uL (ref 150–400)
RBC: 4.35 MIL/uL (ref 4.22–5.81)
RDW: 13.7 % (ref 11.5–15.5)
WBC: 7.3 10*3/uL (ref 4.0–10.5)
nRBC: 0 % (ref 0.0–0.2)

## 2019-11-19 LAB — BASIC METABOLIC PANEL
Anion gap: 11 (ref 5–15)
BUN: 21 mg/dL (ref 8–23)
CO2: 23 mmol/L (ref 22–32)
Calcium: 8.9 mg/dL (ref 8.9–10.3)
Chloride: 101 mmol/L (ref 98–111)
Creatinine, Ser: 1.01 mg/dL (ref 0.61–1.24)
GFR calc Af Amer: >60 mL/min (ref >60)
GFR calc non Af Amer: >60 mL/min (ref >60)
Glucose, Bld: 99 mg/dL (ref 70–99)
Potassium: 3.8 mmol/L (ref 3.5–5.1)
Sodium: 135 mmol/L (ref 135–145)

## 2019-11-19 LAB — BRAIN NATRIURETIC PEPTIDE: B Natriuretic Peptide: 104 pg/mL — ABNORMAL HIGH (ref 0.0–100.0)

## 2019-11-19 LAB — TSH: TSH: 1.77 u[IU]/mL (ref 0.350–4.500)

## 2019-11-19 NOTE — Progress Notes (Signed)
Jonathan Garner., male    DOB: Dec 14, 1945, 74 y.o.   MRN: [id number]   Brief patient profile:  27 yowm active smoker with doe x 2017/18 indolent onset gradually worse referred to pulmonary clinic in [place]  11/19/2019 by Dr  Harl Bowie with nl spirometry 02/2018.  Baseline wt 175 in [place]    History of Present Illness  11/19/2019  Pulmonary/ 1st office eval/ Jonathan Garner / [place] [facility]  Chief Complaint  Patient presents with  . Pulmonary Consult    Referred by Levell July, NP.  Pt c/o SOB for at least the past 2 years. He states that he gets SOB walking approx 50 ft.   Dyspnea:  50 ft also limited by hips /knees  s variability  Cough: none  Sleep: on cpap with Dr Elsworth Soho  SABA use: do not help  No obvious day to day or daytime variability or assoc excess/ purulent sputum or mucus plugs or hemoptysis or cp or chest tightness, subjective wheeze or overt sinus or hb symptoms.   Sleeping ok on cpap  without nocturnal  or early am exacerbation  of respiratory  c/o's or need for noct saba. Also denies any obvious fluctuation of symptoms with weather or environmental changes or other aggravating or alleviating factors except as outlined above   No unusual exposure hx or h/o childhood pna/ asthma or knowledge of premature birth.  Current Allergies, Complete Past Medical History, Past Surgical History, Family History, and Social History were reviewed in [employer] record.  ROS  The following are not active complaints unless bolded Hoarseness, sore throat, dysphagia, dental problems, itching, sneezing,  nasal congestion or discharge of excess mucus or purulent secretions, ear ache,   fever, chills, sweats, unintended wt loss or wt gain, classically pleuritic or exertional cp,  orthopnea pnd or arm/hand swelling  or leg swelling, presyncope, palpitations, abdominal pain, anorexia, nausea, vomiting, diarrhea  or change in bowel habits or change in bladder habits, change in  stools or change in urine, dysuria, hematuria,  rash, arthralgias, visual complaints, headache, numbness, weakness or ataxia or problems with walking or coordination,  change in mood or  memory.             Past Medical History:  Diagnosis Date  . AAA (abdominal aortic aneurysm) ([place])   . CAD (coronary artery disease)   . CHF (congestive heart failure) ([place])   . Diverticulosis   . Dyspnea    W/ EXERTION   . Gynecomastia   . Hx of emphysema   . Hyperlipidemia   . Hypertension   . Lumbar spinal stenosis   . OSA (obstructive sleep apnea)   . Pedal edema     Outpatient Medications Prior to Visit  Medication Sig Dispense Refill  . albuterol (PROVENTIL HFA;VENTOLIN HFA) 108 (90 Base) MCG/ACT inhaler Inhale 1-2 puffs into the lungs every 6 (six) hours as needed for wheezing or shortness of breath. 1 Inhaler 2  . aspirin EC 81 MG tablet Take 81 mg by mouth daily.    . clopidogrel (PLAVIX) 75 MG tablet Take 75 mg by mouth daily.    Marland Kitchen ibuprofen (ADVIL,MOTRIN) 200 MG tablet Take 800 mg by mouth every 8 (eight) hours as needed (FOR PAIN.).    Marland Kitchen labetalol (NORMODYNE) 200 MG tablet Take 200 mg by mouth 2 (two) times daily.    Marland Kitchen losartan (COZAAR) 50 MG tablet Take 50 mg by mouth daily.   0  . torsemide (DEMADEX) 20 MG tablet  Take 20 mg by mouth daily.    . potassium chloride SA (K-DUR,KLOR-CON) 20 MEQ tablet Take 1 tablet (20 mEq total) by mouth daily. 30 tablet 6      Objective:     BP 120/70 (BP Location: Left Arm, Cuff Size: Normal)   Pulse 67   Temp (!) 97 F (36.1 C) (Temporal)   Ht 5\' 7"  (1.702 m)   Wt 246 lb (111.6 kg)   SpO2 99% Comment: on RA  BMI 38.53 kg/m   SpO2: 99 % (on RA)   Obese wm nad    HEENT : pt wearing mask not removed for exam due to covid -19 concerns.    NECK :  without JVD/Nodes/TM/ nl carotid upstrokes bilaterally   LUNGS: no acc muscle use,  Nl contour chest which is clear to A and P bilaterally without cough on insp or exp maneuvers   CV:   RRR  no s3 or murmur or increase in P2, and trace pitting L >R LE edema   ABD:  Quit  Obese soft and nontender with nl inspiratory excursion in the supine position. No bruits or organomegaly appreciated, bowel sounds nl  MS:  Nl gait/ ext warm without deformities, calf tenderness, cyanosis or clubbing No obvious joint restrictions   SKIN: warm and dry without lesions    NEURO:  alert, approp, nl sensorium with  no motor or cerebellar deficits apparent.       I personally reviewed images and agree with radiology impression as follows:  CTa  10/08/19  1. No significant change in caliber and configuration of a thoracic aortic aneurysm status post stent endograft repair of the distal arch to to the diaphragmatic hiatus. The maximum caliber of the excluded aneurysm sac is approximately 4.8 x 4.4 cm. Aortic aneurysm NOS (ICD10-I71.9). 2. There is an unchanged small type I endoleak at the inferior landing site of the thoracic stent component. 3. Unchanged caliber and configuration of the aneurysmal abdominal aorta, status post aortobiiliac stent endograft repair. Maximum caliber of the abdominal aorta is approximately 5.0 x 4.5 cm near the superior landing site of the stent. No evidence of abdominal aortic endoleak. 4.  Aortic Atherosclerosis (ICD10-I70.0). 5. 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 6. Emphysema (ICD10-J43.9). 7. Coronary artery disease.   Labs ordered/ reviewed:      Chemistry      Component Value Date/Time   NA 135 11/19/2019 1255   K 3.8 11/19/2019 1255   CL 101 11/19/2019 1255   CO2 23 11/19/2019 1255   BUN 21 11/19/2019 1255   CREATININE 1.01 11/19/2019 1255      Component Value Date/Time   CALCIUM 8.9 11/19/2019 1255                             Lab Results  Component Value Date   WBC 7.3 11/19/2019   HGB 12.8 (L) 11/19/2019   HCT 38.7 (L)  11/19/2019   MCV 89.0 11/19/2019   PLT 285 11/19/2019       EOS  0.2                                     11/19/2019      BNP   11/19/2019  = 104    Lab Results  Component Value Date   TSH 1.770 11/19/2019     Labs ordered 11/19/2019  :     alpha one AT phenotype           Assessment   COPD GOLD 0 / still smoking  Active smoker - Spirometry 03/09/2018   FEV1 2.3 (84%)  Ratio 0.80  With somewhat blunted f/v loop in effort dep portion s plateau - CTa  10/08/19 c/w emphysema  -   11/19/2019  :    alpha one AT phenotype  Strongly doubt he's developing any copd at this point - see ddx for doe   >>> return for full pfts to see Dr Elsworth Soho for OSA f/u.  No clinical need for new pulmonary rx in meantime    DOE (dyspnea on exertion) Symptoms are markedly disproportionate to objective findings and not clear to what extent this is actually a pulmonary  problem but pt does appear to have difficult to sort out respiratory symptoms of unknown origin for which  DDX  = almost all start with A and  include Adherence, Ace Inhibitors, Acid Reflux, Active Sinus Disease, Alpha 1 Antitripsin deficiency, Anxiety masquerading as Airways dz,  ABPA,  Allergy(esp in young), Aspiration (esp in elderly), Adverse effects of meds,  Active smoking or Vaping, A bunch of PE's/clot burden (a few small clots can't cause this syndrome unless there is already severe underlying pulm or vascular dz with poor reserve),  Anemia or thyroid disorder, plus two Bs  = Bronchiectasis and Beta blocker use..and one C= CHF    Adherence is always the initial "prime suspect" and is a multilayered concern that requires a "trust but verify" approach in every patient - starting with knowing how to use medications, especially inhalers, correctly, keeping up with refills and understanding the fundamental difference between maintenance and prns vs those medications only taken for a very  short course and then stopped and not refilled.   Active smoking at top of the rest of the list of usual suspects (see separate a/p)   ? Alpha one at deficient > send phenotype today   ? Allergy/ asthma :  No eos/ no variability,cough or assoc rhinitis or response to saba so strongly doubt  ? Anemia/ thyroid disorder > ruled out today   ? Anxiety/depression/ deconditioning assoc with about 75 lb of wt gain over baseline  > usually at the bottom of this list of usual suspects but  may interfere with adherence and also interpretation of response or lack thereof to symptom management which can be quite subjective.   ? Beta blocker effect from normodyne: In the setting of respiratory symptoms of unknown etiology,  It would be preferable to use bystolic, the most beta -1  selective Beta blocker available in sample form, with bisoprolol the most selective generic choice  on the market, at least on a trial basis, to make sure the spillover Beta 2 effects of the less specific Beta blockers are not contributing to this patient's symptoms.   ? chf > echo ok 09/11/19 though views limited, minimal edema on exam today, f/u cards planned    OSA on CPAP Follow by Dr Elsworth Soho;  >>>  advised on bed blocks to help offset issue to diaphragm loading in supine position and demonstrated the problem to him today on exam table of the effects of his abd compliance on his wob in that position.  >>> Etiology and pathophysiology of osa including relationship to obesity reviewed in detail      Cigarette smoker Counseled re importance of smoking cessation but did not meet time criteria for separate billing      Morbid obesity due to excess calories ([place]) Complicated by osa/ hbp   Body mass index is 38.53 kg/m.  -   Lab Results  Component Value Date   TSH 1.770 11/19/2019     Contributing to doe/ osa/gerd risk/reviewed the need and the process to achieve and maintain neg calorie balance > defer f/u primary care  including intermittently monitoring thyroid status            Each maintenance medication was reviewed in detail including emphasizing most importantly the difference between maintenance and prns and under what circumstances the prns are to be triggered using an action plan format where appropriate.  Total time for H and P, chart review, counseling,  and generating customized AVS unique to this office visit / charting = 63 min          Christinia Gully, MD 11/19/2019

## 2019-11-19 NOTE — Patient Instructions (Addendum)
The key is to stop smoking completely before smoking completely stops you!    Bed blocks x about  6 - 8 inches would help get you belly off your diaphragm while sleeping   You may need a different blood pressure pill depending on what your repeat lung functions show as your Normodyne (labetolol) can adversely affect your breathing  And Bisoprolol might be a better choice but requires monitoring    Please remember to go to the lab department @ [facility] for your tests - we will call you with the results when they are available.   Schedule follow up with Dr Elsworth Soho in about 6 weeks with PFT's first

## 2019-11-20 ENCOUNTER — Encounter: Payer: Self-pay | Admitting: Internal Medicine

## 2019-11-20 DIAGNOSIS — G4733 Obstructive sleep apnea (adult) (pediatric): Secondary | ICD-10-CM | POA: Insufficient documentation

## 2019-11-20 DIAGNOSIS — F17210 Nicotine dependence, cigarettes, uncomplicated: Secondary | ICD-10-CM | POA: Insufficient documentation

## 2019-11-20 DIAGNOSIS — Z9989 Dependence on other enabling machines and devices: Secondary | ICD-10-CM | POA: Insufficient documentation

## 2019-11-20 LAB — ALPHA-1 ANTITRYPSIN PHENOTYPE: A-1 Antitrypsin, Ser: 144 mg/dL (ref 101–187)

## 2019-11-20 NOTE — Assessment & Plan Note (Addendum)
Symptoms are markedly disproportionate to objective findings and not clear to what extent this is actually a pulmonary  problem but pt does appear to have difficult to sort out respiratory symptoms of unknown origin for which  DDX  = almost all start with A and  include Adherence, Ace Inhibitors, Acid Reflux, Active Sinus Disease, Alpha 1 Antitripsin deficiency, Anxiety masquerading as Airways dz,  ABPA,  Allergy(esp in young), Aspiration (esp in elderly), Adverse effects of meds,  Active smoking or Vaping, A bunch of PE's/clot burden (a few small clots can't cause this syndrome unless there is already severe underlying pulm or vascular dz with poor reserve),  Anemia or thyroid disorder, plus two Bs  = Bronchiectasis and Beta blocker use..and one C= CHF    Adherence is always the initial "prime suspect" and is a multilayered concern that requires a "trust but verify" approach in every patient - starting with knowing how to use medications, especially inhalers, correctly, keeping up with refills and understanding the fundamental difference between maintenance and prns vs those medications only taken for a very short course and then stopped and not refilled.   Active smoking at top of the rest of the list of usual suspects (see separate a/p)   ? Alpha one at deficient > send phenotype today   ? Allergy/ asthma :  No eos/ no variability,cough or assoc rhinitis or response to saba so strongly doubt  ? Anemia/ thyroid disorder > ruled out today   ? Anxiety/depression/ deconditioning assoc with about 75 lb of wt gain over baseline  > usually at the bottom of this list of usual suspects but  may interfere with adherence and also interpretation of response or lack thereof to symptom management which can be quite subjective.   ? Beta blocker effect from normodyne: In the setting of respiratory symptoms of unknown etiology,  It would be preferable to use bystolic, the most beta -1  selective Beta blocker  available in sample form, with bisoprolol the most selective generic choice  on the market, at least on a trial basis, to make sure the spillover Beta 2 effects of the less specific Beta blockers are not contributing to this patient's symptoms.   ? chf > echo ok 09/11/19 though views limited, minimal edema on exam today, f/u cards planned

## 2019-11-20 NOTE — Assessment & Plan Note (Addendum)
Active smoker - Spirometry 03/09/2018   FEV1 2.3 (84%)  Ratio 0.80  With somewhat blunted f/v loop in effort dep portion s plateau - CTa  10/08/19 c/w emphysema  -   11/19/2019  :    alpha one AT phenotype  Strongly doubt he's developing any copd at this point - see ddx for doe   >>> return for full pfts to see Dr Elsworth Soho for OSA f/u.  No clinical need for new pulmonary rx in meantime

## 2019-11-20 NOTE — Progress Notes (Signed)
Spoke with pt and notified of results per Dr. Wert. Pt verbalized understanding and denied any questions. 

## 2019-11-20 NOTE — Assessment & Plan Note (Signed)
Follow by Dr Elsworth Soho;  >>> advised on bed blocks to help offset issue to diaphragm loading in supine position and demonstrated the problem to him today on exam table of the effects of his abd compliance on his wob in that position.  >>> Etiology and pathophysiology of osa including relationship to obesity reviewed in detail

## 2019-11-20 NOTE — Assessment & Plan Note (Signed)
Counseled re importance of smoking cessation but did not meet time criteria for separate billing   °

## 2019-11-20 NOTE — Assessment & Plan Note (Signed)
Complicated by osa/ hbp   Body mass index is 38.53 kg/m.  -   Lab Results  Component Value Date   TSH 1.770 11/19/2019     Contributing to doe/ osa/gerd risk/reviewed the need and the process to achieve and maintain neg calorie balance > defer f/u primary care including intermittently monitoring thyroid status            Each maintenance medication was reviewed in detail including emphasizing most importantly the difference between maintenance and prns and under what circumstances the prns are to be triggered using an action plan format where appropriate.  Total time for H and P, chart review, counseling,  and generating customized AVS unique to this office visit / charting = 63 min

## 2019-11-21 NOTE — Progress Notes (Signed)
Thank You Dr Melvyn Novas. Will see if patient is willing to change to more selective BB (Bystolic) at next visit

## 2019-12-07 DIAGNOSIS — G4733 Obstructive sleep apnea (adult) (pediatric): Secondary | ICD-10-CM | POA: Diagnosis not present

## 2019-12-28 ENCOUNTER — Other Ambulatory Visit: Payer: Self-pay

## 2019-12-28 ENCOUNTER — Other Ambulatory Visit (HOSPITAL_COMMUNITY)
Admission: RE | Admit: 2019-12-28 | Discharge: 2019-12-28 | Disposition: A | Discharge status: Home / Self Care | Payer: Medicare HMO | Source: Ambulatory Visit | Attending: Internal Medicine | Admitting: Internal Medicine

## 2019-12-28 DIAGNOSIS — Z01812 Encounter for preprocedural laboratory examination: Secondary | ICD-10-CM | POA: Diagnosis not present

## 2019-12-28 DIAGNOSIS — Z20822 Contact with and (suspected) exposure to covid-19: Secondary | ICD-10-CM | POA: Diagnosis not present

## 2019-12-28 LAB — SARS CORONAVIRUS 2 (TAT 6-24 HRS): SARS Coronavirus 2: NEGATIVE

## 2020-01-01 ENCOUNTER — Ambulatory Visit (HOSPITAL_COMMUNITY)
Admission: RE | Admit: 2020-01-01 | Discharge: 2020-01-01 | Disposition: A | Discharge status: Home / Self Care | Payer: Medicare HMO | Source: Ambulatory Visit | Attending: Internal Medicine | Admitting: Internal Medicine

## 2020-01-01 ENCOUNTER — Other Ambulatory Visit: Payer: Self-pay

## 2020-01-01 DIAGNOSIS — R0609 Other forms of dyspnea: Secondary | ICD-10-CM

## 2020-01-01 DIAGNOSIS — R0600 Dyspnea, unspecified: Secondary | ICD-10-CM | POA: Diagnosis present

## 2020-01-01 DIAGNOSIS — J449 Chronic obstructive pulmonary disease, unspecified: Secondary | ICD-10-CM | POA: Diagnosis not present

## 2020-01-01 LAB — PULMONARY FUNCTION TEST
DL/VA % pred: 81 %
DL/VA: 3.26 ml/min/mmHg/L
DLCO unc % pred: 76 %
DLCO unc: 17.56 ml/min/mmHg
FEF 25-75 Post: 1.73 L/sec
FEF 25-75 Pre: 1.32 L/sec
FEF2575-%Change-Post: 30 %
FEF2575-%Pred-Post: 88 %
FEF2575-%Pred-Pre: 67 %
FEV1-%Change-Post: 5 %
FEV1-%Pred-Post: 92 %
FEV1-%Pred-Pre: 87 %
FEV1-Post: 2.5 L
FEV1-Pre: 2.36 L
FEV1FVC-%Change-Post: 5 %
FEV1FVC-%Pred-Pre: 95 %
FEV6-%Change-Post: 1 %
FEV6-%Pred-Post: 95 %
FEV6-%Pred-Pre: 94 %
FEV6-Post: 3.34 L
FEV6-Pre: 3.29 L
FEV6FVC-%Change-Post: 0 %
FEV6FVC-%Pred-Post: 104 %
FEV6FVC-%Pred-Pre: 103 %
FVC-%Change-Post: 0 %
FVC-%Pred-Post: 91 %
FVC-%Pred-Pre: 90 %
FVC-Post: 3.43 L
FVC-Pre: 3.4 L
Post FEV1/FVC ratio: 73 %
Post FEV6/FVC ratio: 97 %
Pre FEV1/FVC ratio: 69 %
Pre FEV6/FVC Ratio: 97 %
RV % pred: 121 %
RV: 2.89 L
TLC % pred: 102 %
TLC: 6.57 L

## 2020-01-01 MED ORDER — ALBUTEROL SULFATE (2.5 MG/3ML) 0.083% IN NEBU
2.5000 mg | INHALATION_SOLUTION | Freq: Once | RESPIRATORY_TRACT | Status: AC
  Administered 2020-01-01: 2.5 mg via RESPIRATORY_TRACT

## 2020-01-07 DIAGNOSIS — G4733 Obstructive sleep apnea (adult) (pediatric): Secondary | ICD-10-CM | POA: Diagnosis not present

## 2020-01-08 ENCOUNTER — Encounter: Payer: Self-pay | Admitting: Pulmonary Disease

## 2020-01-08 ENCOUNTER — Other Ambulatory Visit (HOSPITAL_COMMUNITY)
Admission: RE | Admit: 2020-01-08 | Discharge: 2020-01-08 | Disposition: A | Discharge status: Home / Self Care | Payer: Medicare HMO | Source: Ambulatory Visit | Attending: Pulmonary Disease | Admitting: Pulmonary Disease

## 2020-01-08 ENCOUNTER — Other Ambulatory Visit: Payer: Self-pay

## 2020-01-08 ENCOUNTER — Ambulatory Visit: Payer: Medicare HMO | Admitting: Pulmonary Disease

## 2020-01-08 VITALS — BP 130/70 | HR 98 | Temp 97.4°F | Ht 67.0 in | Wt 250.0 lb

## 2020-01-08 DIAGNOSIS — M25542 Pain in joints of left hand: Secondary | ICD-10-CM | POA: Insufficient documentation

## 2020-01-08 DIAGNOSIS — M25531 Pain in right wrist: Secondary | ICD-10-CM | POA: Diagnosis not present

## 2020-01-08 DIAGNOSIS — M25541 Pain in joints of right hand: Secondary | ICD-10-CM | POA: Insufficient documentation

## 2020-01-08 DIAGNOSIS — M25532 Pain in left wrist: Secondary | ICD-10-CM | POA: Diagnosis not present

## 2020-01-08 DIAGNOSIS — Z23 Encounter for immunization: Secondary | ICD-10-CM | POA: Diagnosis not present

## 2020-01-08 DIAGNOSIS — M25559 Pain in unspecified hip: Secondary | ICD-10-CM | POA: Insufficient documentation

## 2020-01-08 DIAGNOSIS — J449 Chronic obstructive pulmonary disease, unspecified: Secondary | ICD-10-CM

## 2020-01-08 DIAGNOSIS — M2550 Pain in unspecified joint: Secondary | ICD-10-CM | POA: Insufficient documentation

## 2020-01-08 LAB — COMPREHENSIVE METABOLIC PANEL
ALT: 17 U/L (ref 0–44)
AST: 17 U/L (ref 15–41)
Albumin: 4.1 g/dL (ref 3.5–5.0)
Alkaline Phosphatase: 107 U/L (ref 38–126)
Anion gap: 10 (ref 5–15)
BUN: 22 mg/dL (ref 8–23)
CO2: 21 mmol/L — ABNORMAL LOW (ref 22–32)
Calcium: 9 mg/dL (ref 8.9–10.3)
Chloride: 106 mmol/L (ref 98–111)
Creatinine, Ser: 1.12 mg/dL (ref 0.61–1.24)
GFR, Estimated: >60 mL/min (ref >60)
Glucose, Bld: 104 mg/dL — ABNORMAL HIGH (ref 70–99)
Potassium: 4.2 mmol/L (ref 3.5–5.1)
Sodium: 137 mmol/L (ref 135–145)
Total Bilirubin: 0.6 mg/dL (ref 0.3–1.2)
Total Protein: 7.5 g/dL (ref 6.5–8.1)

## 2020-01-08 LAB — CBC WITH DIFFERENTIAL/PLATELET
Abs Immature Granulocytes: 0.01 10*3/uL (ref 0.00–0.07)
Basophils Absolute: 0 10*3/uL (ref 0.0–0.1)
Basophils Relative: 1 %
Eosinophils Absolute: 0.2 10*3/uL (ref 0.0–0.5)
Eosinophils Relative: 3 %
HCT: 43.6 % (ref 39.0–52.0)
Hemoglobin: 13.9 g/dL (ref 13.0–17.0)
Immature Granulocytes: 0 %
Lymphocytes Relative: 27 %
Lymphs Abs: 2 10*3/uL (ref 0.7–4.0)
MCH: 29.1 pg (ref 26.0–34.0)
MCHC: 31.9 g/dL (ref 30.0–36.0)
MCV: 91.2 fL (ref 80.0–100.0)
Monocytes Absolute: 0.8 10*3/uL (ref 0.1–1.0)
Monocytes Relative: 11 %
Neutro Abs: 4.4 10*3/uL (ref 1.7–7.7)
Neutrophils Relative %: 58 %
Platelets: 291 10*3/uL (ref 150–400)
RBC: 4.78 MIL/uL (ref 4.22–5.81)
RDW: 14 % (ref 11.5–15.5)
WBC: 7.5 10*3/uL (ref 4.0–10.5)
nRBC: 0 % (ref 0.0–0.2)

## 2020-01-08 LAB — URIC ACID: Uric Acid, Serum: 10.4 mg/dL — ABNORMAL HIGH (ref 3.7–8.6)

## 2020-01-08 LAB — SEDIMENTATION RATE: Sed Rate: 20 mm/hr — ABNORMAL HIGH (ref 0–16)

## 2020-01-08 NOTE — Progress Notes (Signed)
   Subjective:    Patient ID: Jonathan Nordmann., male    DOB: [birth year], 74 y.o.   MRN: [id number]  HPI  74 yo  obese smoker for FU of COPD. PMH - chronic diastolic heart failure  s/p stent graft repair for thoracic aortic aneurysm with extension into the left common iliac artery on 10/26/2017.   Chief Complaint  Patient presents with  . Follow-up    PFT 11/16, Patient states his breathing is worse since last visit, shortness of breath with exertion, States that he has pain in all of his joints    I last saw him in January 2020 and he has been following by my partner Dr. Melvyn Novas since. He continues to smoke half pack per day.  He is a Retail banker.  For the last 2 years he has been complaining of increasing dyspnea and arthralgias which have progressed to his knees and ankles in his hands.  He is unable to hold things because of stiffness in his hands.  Unfortunately he states that his PCP has not been able to help him.  He developed a golf ball sized swelling on his left elbow which was lanced by his PCP with drainage of milky white discharge, this continues to drain, PCP has been unable to pinpoint this problem  He also complains of cramping in his hands  Significant tests/ events reviewed PFTs 12/2019 ratio 69, FEV1 2.36/87%, FVC 90%, TLC normal, DLCO 17.56/76%, mild airway obstruction  Spirometry 02/2018 no evidence of airway obstruction with ratio of 80, FEV1 84% FVC of 76%  CTa  10/08/19 c/w emphysema status post stent endograft repair in 2019, 2013, 2018. unchanged small type I endoleak at the inferior landing site of the thoracic stent component.   -   11/19/2019  :    alpha one AT phenotype   Review of Systems   Arthralgias as above No skin rash History of tick bites Cramps in his hand   neg for any significant sore throat, dysphagia, itching, sneezing, nasal congestion or excess/ purulent secretions, fever, chills, sweats, unintended wt loss, pleuritic or exertional cp,  hempoptysis, orthopnea pnd or change in chronic leg swelling. Also denies presyncope, palpitations, heartburn, abdominal pain, nausea, vomiting, diarrhea or change in bowel or urinary habits, dysuria,hematuria, rash, visual complaints, headache, numbness weakness or ataxia.     Objective:   Physical Exam  Gen. Pleasant, obese, in no distress ENT - no lesions, no post nasal drip Neck: No JVD, no thyromegaly, no carotid bruits Lungs: no use of accessory muscles, no dullness to percussion, decreased without rales or rhonchi  Cardiovascular: Rhythm regular, heart sounds  normal, no murmurs or gallops, no peripheral edema Musculoskeletal: Unable to make a fist, no cyanosis or clubbing , no tremors, swelling of left knee, on no fluctuation       Assessment & Plan:

## 2020-01-08 NOTE — Assessment & Plan Note (Signed)
I think his airway obstruction is only mild and does not account for his severe dyspnea and his constitutional issues. He will continue to use albuterol as needed. Unfortunately he continues to smoke and once again emphasized smoking cessation he does not feel like this is his main problem though

## 2020-01-08 NOTE — Patient Instructions (Addendum)
Referral to rheumatology   Blood work today  FLu shot

## 2020-01-08 NOTE — Assessment & Plan Note (Signed)
Arthralgias of both hands, knees and ankles with cramps of his hands and what appears to be paresthesias in his feet -defer to his primary care for input but we will start a work-up, this has been ongoing and worsening for the last 2 years. We will refer him to rheumatology. 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

## 2020-01-09 LAB — ANA: Anti Nuclear Antibody (ANA): NEGATIVE

## 2020-01-09 LAB — MISC LABCORP TEST (SEND OUT): Labcorp test code: 160333

## 2020-01-15 ENCOUNTER — Encounter: Payer: Self-pay | Admitting: Cardiology

## 2020-01-15 ENCOUNTER — Ambulatory Visit: Payer: Medicare HMO | Admitting: Cardiology

## 2020-01-15 ENCOUNTER — Encounter: Payer: Self-pay | Admitting: *Deleted

## 2020-01-15 VITALS — BP 120/64 | HR 84 | Ht 67.0 in | Wt 245.8 lb

## 2020-01-15 DIAGNOSIS — R0600 Dyspnea, unspecified: Secondary | ICD-10-CM

## 2020-01-15 DIAGNOSIS — I5032 Chronic diastolic (congestive) heart failure: Secondary | ICD-10-CM

## 2020-01-15 DIAGNOSIS — R0609 Other forms of dyspnea: Secondary | ICD-10-CM

## 2020-01-15 NOTE — Patient Instructions (Signed)
Your physician wants you to follow-up in: 6 MONTHS WITH DR BRANCH You will receive a reminder letter in the mail two months in advance. If you don't receive a letter, please call our office to schedule the follow-up appointment.  Your physician recommends that you continue on your current medications as directed. Please refer to the Current Medication list given to you today.  Your physician has requested that you have a lexiscan myoview. For further information please visit [email or web address]. Please follow instruction sheet, as given.   Thank you for choosing [facility]!!   

## 2020-01-15 NOTE — Progress Notes (Signed)
Clinical Summary Mr. Walston is a 74 y.o.male seen today for follow up of the following medical problems.   1. Chronic diastolic HF - 02/7406 echo LVEF 14%, grade I diastolic dysfunction - no recent LE edema, chronic DOE unchanged.   2. DOE - 09/2019 echo LVEF 48%, grade I diastolic dysfunction 18/5631 PFTs: minimal obstruction, minimal diffusion defect - ongoing symptoms. Denies any chest pain.    2. History of thoracic and abdominal aneurysm repair - followed by vascular   3. OSA - has cpap at home, 2-3 years      He is on DAPT, I am unclear indication.  Past Medical History:  Diagnosis Date  . AAA (abdominal aortic aneurysm) ()   . CAD (coronary artery disease)   . CHF (congestive heart failure) ([place])   . Diverticulosis   . Dyspnea    W/ EXERTION   . Gynecomastia   . Hx of emphysema   . Hyperlipidemia   . Hypertension   . Lumbar spinal stenosis   . OSA (obstructive sleep apnea)   . Pedal edema      No Known Allergies   Current Outpatient Medications  Medication Sig Dispense Refill  . albuterol (PROVENTIL HFA;VENTOLIN HFA) 108 (90 Base) MCG/ACT inhaler Inhale 1-2 puffs into the lungs every 6 (six) hours as needed for wheezing or shortness of breath. 1 Inhaler 2  . aspirin EC 81 MG tablet Take 81 mg by mouth daily.    . clopidogrel (PLAVIX) 75 MG tablet Take 75 mg by mouth daily.    Marland Kitchen ibuprofen (ADVIL,MOTRIN) 200 MG tablet Take 800 mg by mouth every 8 (eight) hours as needed (FOR PAIN.).    Marland Kitchen labetalol (NORMODYNE) 200 MG tablet Take 200 mg by mouth 2 (two) times daily.    Marland Kitchen losartan (COZAAR) 50 MG tablet Take 50 mg by mouth daily.   0  . torsemide (DEMADEX) 20 MG tablet Take 20 mg by mouth daily.     No current facility-administered medications for this visit.     Past Surgical History:  Procedure Laterality Date  . ABDOMINAL AORTIC ANEURYSM REPAIR  2013 and 2018  . BACK SURGERY    . CARDIAC CATHETERIZATION  04/2016   [place], [place]  .  ELBOW ARTHROSCOPY Left 2011  . NECK SURGERY    . THORACIC AORTIC ENDOVASCULAR STENT GRAFT N/A 10/26/2017   Procedure: THORACIC AORTIC ENDOVASCULAR STENT GRAFT WITH ILIAC EXTENSIONS;  Surgeon: Rosetta Posner, MD;  Location: [facility] OR;  Service: Vascular;  Laterality: N/A;  PATIENT WILL NEED SPINAL CORD DRAIN BY ANESTHESIA  . VASECTOMY  1976     No Known Allergies    Family History  Problem Relation Age of Onset  . Alzheimer's disease Mother   . Heart attack Father   . Leukemia Father   . Hypertension Father      Social History Mr. Mccubbins reports that he has been smoking cigars. He has a 58.00 pack-year smoking history. He has never used smokeless tobacco. Mr. Matters reports current alcohol use.   Review of Systems CONSTITUTIONAL: No weight loss, fever, chills, weakness or fatigue.  HEENT: Eyes: No visual loss, blurred vision, double vision or yellow sclerae.No hearing loss, sneezing, congestion, runny nose or sore throat.  SKIN: No rash or itching.  CARDIOVASCULAR: per hpi RESPIRATORY: No shortness of breath, cough or sputum.  GASTROINTESTINAL: No anorexia, nausea, vomiting or diarrhea. No abdominal pain or blood.  GENITOURINARY: No burning on urination, no polyuria NEUROLOGICAL: No headache,  dizziness, syncope, paralysis, ataxia, numbness or tingling in the extremities. No change in bowel or bladder control.  MUSCULOSKELETAL: No muscle, back pain, joint pain or stiffness.  LYMPHATICS: No enlarged nodes. No history of splenectomy.  PSYCHIATRIC: No history of depression or anxiety.  ENDOCRINOLOGIC: No reports of sweating, cold or heat intolerance. No polyuria or polydipsia.  Marland Kitchen   Physical Examination Today's Vitals   01/15/20 0824  BP: 120/64  Pulse: 84  SpO2: 98%  Weight: 245 lb 12.8 oz (111.5 kg)  Height: 5\' 7"  (1.702 m)   Body mass index is 38.5 kg/m.  Gen: resting comfortably, no acute distress HEENT: no scleral icterus, pupils equal round and reactive, no  palptable cervical adenopathy,  CV: RRR, no m/rg, no jvd Resp: Clear to auscultation bilaterally GI: abdomen is soft, non-tender, non-distended, normal bowel sounds, no hepatosplenomegaly MSK: extremities are warm, no edema.  Skin: warm, no rash Neuro:  no focal deficits Psych: appropriate affect   Diagnostic Studies  08/2019 echo 1. LV and endocardium poorly visualized. Very rough estimate on LVEF low  normal 50%. Strongly recommend limited echocontrast study to better  evaluate. . Left ventricular ejection fraction, by estimation, is 50%. The  left ventricle has low normal  function. Left ventricular endocardial border not optimally defined to  evaluate regional wall motion. There is mild left ventricular hypertrophy.  Left ventricular diastolic parameters are consistent with Grade I  diastolic dysfunction (impaired  relaxation).  2. Right ventricular systolic function is normal. The right ventricular  size is normal. There is normal pulmonary artery systolic pressure.  3. The mitral valve is normal in structure. Mild mitral valve  regurgitation. No evidence of mitral stenosis.  4. The aortic valve was not well visualized. Aortic valve regurgitation  is not visualized. No aortic stenosis is present.  5. The inferior vena cava is normal in size with greater than 50%  respiratory variability, suggesting right atrial pressure of 3 mmHg.  09/2019 echo IMPRESSIONS    1. Left ventricular ejection fraction, by estimation, is 50%. The left  ventricle has normal function. The left ventricle has no regional wall  motion abnormalities.  2. Limited echo with contrast to evaluate LV function.   Assessment and Plan  1. Chronc diastolic HF - appears euvolemic, continue diuretic  2. DOE - unclear etiology, recently fairly benign echo and PFTs - obtain lexiscan in setting of DOE, CAD risk factors, and LBBB on EKG.     Will message Dr Donnetta Hutching regarding patient's DAPT, not on for  cardiac reason, appears has been on several years I would imagine related to prior aneurysm repairs.    Arnoldo Lenis, M.D.

## 2020-01-17 DIAGNOSIS — I714 Abdominal aortic aneurysm, without rupture: Secondary | ICD-10-CM | POA: Diagnosis not present

## 2020-01-17 DIAGNOSIS — M25552 Pain in left hip: Secondary | ICD-10-CM | POA: Diagnosis not present

## 2020-01-17 DIAGNOSIS — M25551 Pain in right hip: Secondary | ICD-10-CM | POA: Diagnosis not present

## 2020-01-17 DIAGNOSIS — G720 Drug-induced myopathy: Secondary | ICD-10-CM | POA: Diagnosis not present

## 2020-01-17 DIAGNOSIS — M25561 Pain in right knee: Secondary | ICD-10-CM | POA: Diagnosis not present

## 2020-01-17 DIAGNOSIS — M25562 Pain in left knee: Secondary | ICD-10-CM | POA: Diagnosis not present

## 2020-01-17 DIAGNOSIS — M25542 Pain in joints of left hand: Secondary | ICD-10-CM | POA: Diagnosis not present

## 2020-01-17 DIAGNOSIS — M25541 Pain in joints of right hand: Secondary | ICD-10-CM | POA: Diagnosis not present

## 2020-01-23 ENCOUNTER — Encounter (HOSPITAL_COMMUNITY)
Admission: RE | Admit: 2020-01-23 | Discharge: 2020-01-23 | Disposition: A | Discharge status: Home / Self Care | Payer: Medicare HMO | Source: Ambulatory Visit | Attending: Cardiology | Admitting: Cardiology

## 2020-01-23 ENCOUNTER — Encounter (HOSPITAL_COMMUNITY): Payer: Self-pay

## 2020-01-23 ENCOUNTER — Other Ambulatory Visit: Payer: Self-pay

## 2020-01-23 ENCOUNTER — Encounter (HOSPITAL_BASED_OUTPATIENT_CLINIC_OR_DEPARTMENT_OTHER)
Admission: RE | Admit: 2020-01-23 | Discharge: 2020-01-23 | Disposition: A | Discharge status: Home / Self Care | Payer: Medicare HMO | Source: Ambulatory Visit | Attending: Cardiology | Admitting: Cardiology

## 2020-01-23 DIAGNOSIS — R0600 Dyspnea, unspecified: Secondary | ICD-10-CM | POA: Diagnosis not present

## 2020-01-23 DIAGNOSIS — R0609 Other forms of dyspnea: Secondary | ICD-10-CM

## 2020-01-23 LAB — NM MYOCAR MULTI W/SPECT W/WALL MOTION / EF
LV dias vol: 123 mL (ref 62–150)
LV sys vol: 73 mL
Peak HR: 95 {beats}/min
RATE: 0.2
Rest HR: 71 {beats}/min
SDS: 7
SRS: 12
SSS: 19
TID: 0.93

## 2020-01-23 MED ORDER — REGADENOSON 0.4 MG/5ML IV SOLN
INTRAVENOUS | Status: AC
  Administered 2020-01-23: 0.4 mg via INTRAVENOUS
  Filled 2020-01-23: qty 5

## 2020-01-23 MED ORDER — TECHNETIUM TC 99M TETROFOSMIN IV KIT
30.0000 | PACK | Freq: Once | INTRAVENOUS | Status: AC | PRN
  Administered 2020-01-23: 32 via INTRAVENOUS

## 2020-01-23 MED ORDER — SODIUM CHLORIDE FLUSH 0.9 % IV SOLN
INTRAVENOUS | Status: AC
  Administered 2020-01-23: 10 mL via INTRAVENOUS
  Filled 2020-01-23: qty 10

## 2020-01-23 MED ORDER — TECHNETIUM TC 99M TETROFOSMIN IV KIT
10.0000 | PACK | Freq: Once | INTRAVENOUS | Status: AC | PRN
  Administered 2020-01-23: 10.5 via INTRAVENOUS

## 2020-01-25 ENCOUNTER — Telehealth: Payer: Self-pay | Admitting: *Deleted

## 2020-01-25 NOTE — Telephone Encounter (Signed)
-----   Message from Arnoldo Lenis, MD sent at 01/25/2020 11:03 AM EST ----- Stress test suggests some possible old blockages in the heart but no active current blockages at this time   Zandra Abts MD

## 2020-01-25 NOTE — Telephone Encounter (Signed)
Pt voiced understanding

## 2020-01-28 ENCOUNTER — Telehealth: Payer: Self-pay | Admitting: *Deleted

## 2020-01-28 NOTE — Telephone Encounter (Signed)
-----   Message from Arnoldo Lenis, MD sent at 01/28/2020  9:55 AM EST ----- Heard back from Dr Donnetta Hutching, does not need to continue both aspirin and plavix. Can d/c plavix and continue just aspirin. Please copy this into his chart as well   Zandra Abts MD ----- Message ----- From: Rosetta Posner, MD Sent: 01/15/2020  11:32 AM EST To: Arnoldo Lenis, MD  Single agent would be fine from my standpoint.  I do not see any indication for dual antiplatelet based on his vascular issues.  Thanks.  Todd ----- Message ----- From: Arnoldo Lenis, MD Sent: 01/15/2020   9:11 AM EST To: Rosetta Posner, MD  Dr Donnetta Hutching,  Mutual patient of ours. He has been on DAPT for a number of years, I think before either of our groups started seeing him. He has not had any cardiac indication for it, im assuming it was started in relation to his aneurysm repairs? From a vascular standpoint is there an indication to continue DAPT or would a single agent be sufficent?   Carlyle Dolly MD

## 2020-01-28 NOTE — Telephone Encounter (Signed)
Pt voiced understanding - updated medication list 

## 2020-02-06 DIAGNOSIS — G4733 Obstructive sleep apnea (adult) (pediatric): Secondary | ICD-10-CM | POA: Diagnosis not present

## 2020-02-12 DIAGNOSIS — M25531 Pain in right wrist: Secondary | ICD-10-CM | POA: Diagnosis not present

## 2020-02-20 ENCOUNTER — Ambulatory Visit: Payer: Self-pay

## 2020-02-20 ENCOUNTER — Ambulatory Visit: Payer: Medicare HMO | Admitting: Internal Medicine

## 2020-02-20 ENCOUNTER — Encounter: Payer: Self-pay | Admitting: Internal Medicine

## 2020-02-20 ENCOUNTER — Other Ambulatory Visit: Payer: Self-pay

## 2020-02-20 VITALS — BP 132/80 | HR 94 | Ht 65.75 in | Wt 245.8 lb

## 2020-02-20 DIAGNOSIS — M25531 Pain in right wrist: Secondary | ICD-10-CM | POA: Diagnosis not present

## 2020-02-20 DIAGNOSIS — M25532 Pain in left wrist: Secondary | ICD-10-CM

## 2020-02-20 DIAGNOSIS — S51002S Unspecified open wound of left elbow, sequela: Secondary | ICD-10-CM

## 2020-02-20 DIAGNOSIS — M1A9XX1 Chronic gout, unspecified, with tophus (tophi): Secondary | ICD-10-CM | POA: Diagnosis not present

## 2020-02-20 NOTE — Patient Instructions (Addendum)
Start taking allopurinol 100mg  by mouth daily Finish current colchicine course then continue at 1 tablet daily Try to limit most of diet to low and moderate purine content foods We will follow up in about 4 weeks to check uric acid level and check tolerating medication.    Gout  Gout is painful swelling of your joints. Gout is a type of arthritis. It is caused by having too much uric acid in your body. Uric acid is a chemical that is made when your body breaks down substances called purines. If your body has too much uric acid, sharp crystals can form and build up in your joints. This causes pain and swelling. Gout attacks can happen quickly and be very painful (acute gout). Over time, the attacks can affect more joints and happen more often (chronic gout). What are the causes?  Too much uric acid in your blood. This can happen because: ? Your kidneys do not remove enough uric acid from your blood. ? Your body makes too much uric acid. ? You eat too many foods that are high in purines. These foods include organ meats, some seafood, and beer.  Trauma or stress. What increases the risk?  Having a family history of gout.  Being male and middle-aged.  Being male and having gone through menopause.  Being very overweight (obese).  Drinking alcohol, especially beer.  Not having enough water in the body (being dehydrated).  Losing weight too quickly.  Having an organ transplant.  Having lead poisoning.  Taking certain medicines.  Having kidney disease.  Having a skin condition called psoriasis. What are the signs or symptoms? An attack of acute gout usually happens in just one joint. The most common place is the big toe. Attacks often start at night. Other joints that may be affected include joints of the feet, ankle, knee, fingers, wrist, or elbow. Symptoms of an attack may include:  Very bad pain.  Warmth.  Swelling.  Stiffness.  Shiny, red, or purple  skin.  Tenderness. The affected joint may be very painful to touch.  Chills and fever. Chronic gout may cause symptoms more often. More joints may be involved. You may also have white or yellow lumps (tophi) on your hands or feet or in other areas near your joints. How is this treated?  Treatment for this condition has two phases: treating an acute attack and preventing future attacks.  Acute gout treatment may include: ? NSAIDs. ? Steroids. These are taken by mouth or injected into a joint. ? Colchicine. This medicine relieves pain and swelling. It can be given by mouth or through an IV tube.  Preventive treatment may include: ? Taking small doses of NSAIDs or colchicine daily. ? Using a medicine that reduces uric acid levels in your blood. ? Making changes to your diet. You may need to see a food expert (dietitian) about what to eat and drink to prevent gout. Follow these instructions at home: During a gout attack   If told, put ice on the painful area: ? Put ice in a plastic bag. ? Place a towel between your skin and the bag. ? Leave the ice on for 20 minutes, 2-3 times a day.  Raise (elevate) the painful joint above the level of your heart as often as you can.  Rest the joint as much as possible. If the joint is in your leg, you may be given crutches.  Follow instructions from your doctor about what you cannot eat or drink. Avoiding  future gout attacks  Eat a low-purine diet. Avoid foods and drinks such as: ? Liver. ? Kidney. ? Anchovies. ? Asparagus. ? Herring. ? Mushrooms. ? Mussels. ? Beer.  Stay at a healthy weight. If you want to lose weight, talk with your doctor. Do not lose weight too fast.  Start or continue an exercise plan as told by your doctor. Eating and drinking  Drink enough fluids to keep your pee (urine) pale yellow.  If you drink alcohol: ? Limit how much you use to:  0-1 drink a day for women.  0-2 drinks a day for men. ? Be aware of  how much alcohol is in your drink. In the [place] one drink equals one 12 oz bottle of beer (355 mL), one 5 oz glass of wine (148 mL), or one 1 oz glass of hard liquor (44 mL). General instructions  Take over-the-counter and prescription medicines only as told by your doctor.  Do not drive or use heavy machinery while taking prescription pain medicine.  Return to your normal activities as told by your doctor. Ask your doctor what activities are safe for you.  Keep all follow-up visits as told by your doctor. This is important. Contact a doctor if:  You have another gout attack.  You still have symptoms of a gout attack after 10 days of treatment.  You have problems (side effects) because of your medicines.  You have chills or a fever.  You have burning pain when you pee (urinate).  You have pain in your lower back or belly. Get help right away if:  You have very bad pain.  Your pain cannot be controlled.  You cannot pee. Summary  Gout is painful swelling of the joints.  The most common site of pain is the big toe, but it can affect other joints.  Medicines and avoiding some foods can help to prevent and treat gout attacks.     Low-Purine Eating Plan A low-purine eating plan involves making food choices to limit your intake of purine. Purine is a kind of uric acid. Too much uric acid in your blood can cause certain conditions, such as gout and kidney stones. Eating a low-purine diet can help control these conditions. What are tips for following this plan? Reading food labels   Avoid foods with saturated or Trans fat.  Check the ingredient list of grains-based foods, such as bread and cereal, to make sure that they contain whole grains.  Check the ingredient list of sauces or soups to make sure they do not contain meat or fish.  When choosing soft drinks, check the ingredient list to make sure they do not contain high-fructose corn syrup. Shopping  Buy plenty of  fresh fruits and vegetables.  Avoid buying canned or fresh fish.  Buy dairy products labeled as low-fat or nonfat.  Avoid buying premade or processed foods. These foods are often high in fat, salt (sodium), and added sugar. Cooking  Use olive oil instead of butter when cooking. Oils like olive oil, canola oil, and sunflower oil contain healthy fats. Meal planning  Learn which foods do or do not affect you. If you find out that a food tends to cause your gout symptoms to flare up, avoid eating that food. You can enjoy foods that do not cause problems. If you have any questions about a food item, talk with your dietitian or health care provider.  Limit foods high in fat, especially saturated fat. Fat makes it harder for  your body to get rid of uric acid.  Choose foods that are lower in fat and are lean sources of protein. General guidelines  Limit alcohol intake to no more than 1 drink a day for nonpregnant women and 2 drinks a day for men. One drink equals 12 oz of beer, 5 oz of wine, or 1 oz of hard liquor. Alcohol can affect the way your body gets rid of uric acid.  Drink plenty of water to keep your urine clear or pale yellow. Fluids can help remove uric acid from your body.  If directed by your health care provider, take a vitamin C supplement.  Work with your health care provider and dietitian to develop a plan to achieve or maintain a healthy weight. Losing weight can help reduce uric acid in your blood. What foods are recommended? The items listed may not be a complete list. Talk with your dietitian about what dietary choices are best for you. Foods low in purines Foods low in purines do not need to be limited. These include:  All fruits.  All low-purine vegetables, pickles, and olives.  Breads, pasta, Ryian Lynde, cornbread, and popcorn. Cake and other baked goods.  All dairy foods.  Eggs, nuts, and nut butters.  Spices and condiments, such as salt, herbs, and  vinegar.  Plant oils, butter, and margarine.  Water, sugar-free soft drinks, tea, coffee, and cocoa.  Vegetable-based soups, broths, sauces, and gravies. Foods moderate in purines Foods moderate in purines should be limited to the amounts listed.   cup of asparagus, cauliflower, spinach, mushrooms, or green peas, each day.  2/3 cup uncooked oatmeal, each day.   cup dry wheat bran or wheat germ, each day.  2-3 ounces of meat or poultry, each day.  4-6 ounces of shellfish, such as crab, lobster, oysters, or shrimp, each day.  1 cup cooked beans, peas, or lentils, each day.  Soup, broths, or bouillon made from meat or fish. Limit these foods as much as possible. What foods are not recommended? The items listed may not be a complete list. Talk with your dietitian about what dietary choices are best for you. Limit your intake of foods high in purines, including:  Beer and other alcohol.  Meat-based gravy or sauce.  Canned or fresh fish, such as: ? Anchovies, sardines, herring, and tuna. ? Mussels and scallops. ? Codfish, trout, and haddock.  Berniece Salines.  Organ meats, such as: ? Liver or kidney. ? Tripe. ? Sweetbreads (thymus gland or pancreas).  Wild [employer] biochemist.  Yeast or yeast extract supplements.  Drinks sweetened with high-fructose corn syrup. Summary  Eating a low-purine diet can help control conditions caused by too much uric acid in the body, such as gout or kidney stones.  Choose low-purine foods, limit alcohol, and limit foods high in fat.  You will learn over time which foods do or do not affect you. If you find out that a food tends to cause your gout symptoms to flare up, avoid eating that food.

## 2020-02-20 NOTE — Progress Notes (Unsigned)
Office Visit Note  Patient: Jonathan Garner.             Date of Birth: [birth year]           MRN: [id number]             PCP: Carolee Rota, NP Referring: Rigoberto Noel, MD Visit Date: 02/20/2020   Subjective:  New Patient (Initial Visit) (Patient currently complains of right hand/wrist pain and some discomfort in the left pointer finger. Patient also complains of left elbow pain and swelling that is seeping fluid. ) and Gout (Patient recently had a gout attack after eating shrimp. Patient was recently given Prednisone dose pack, he finished yesterday, and Colchicine which have improved symptoms. )   History of Present Illness: Jonathan Garner. is a 75 y.o. male with diastolic CHF, COPD, HTN, and OSA here for evaluation of pain and stiffness in bilateral hands. He has a history of gout for years typically attacks in the foot provoked by eating seafood. He usually had only 1-2 episodes per year and has never taken medications for this besides as needed antiinflammatory medicine most beneficial has been prednisone taper. More recently he was having pain and stiffness of both hands which is not typical for him. This was then exacerbated with severe pain and swelling in the right hand and wrist last month he thinks this was provoked after eating a lot of seafood around Christmas. It is improved today after prednisone taper. He has also had a persistent left elbow nodule this was drained in his primary care office with white chalklike contents noted and negative for infection. However this has continued to drain fluid slowly and failed to fully heal now for many months.  Labs reviewed 12/2019 ANA negative ESR 20 Uric acid 10.4 CCP neg   Activities of Daily Living:  Patient reports morning stiffness for 24 hours.   Patient Reports nocturnal pain.  Difficulty dressing/grooming: Denies Difficulty climbing stairs: Reports Difficulty getting out of chair: Reports Difficulty using hands  for taps, buttons, cutlery, and/or writing: Reports  Review of Systems  Constitutional: Negative for fatigue.  HENT: Negative for mouth sores, mouth dryness and nose dryness.   Eyes: Positive for dryness. Negative for pain, itching and visual disturbance.  Respiratory: Positive for shortness of breath. Negative for cough, hemoptysis and difficulty breathing.   Cardiovascular: Positive for swelling in legs/feet. Negative for chest pain and palpitations.  Gastrointestinal: Negative for abdominal pain, blood in stool, constipation and diarrhea.  Endocrine: Negative for increased urination.  Genitourinary: Negative for painful urination.  Musculoskeletal: Positive for arthralgias, joint pain, joint swelling and morning stiffness. Negative for myalgias, muscle weakness, muscle tenderness and myalgias.  Skin: Negative for color change, rash and redness.  Allergic/Immunologic: Negative for susceptible to infections.  Neurological: Positive for numbness. Negative for dizziness, headaches, memory loss and weakness.  Hematological: Negative for swollen glands.  Psychiatric/Behavioral: Negative for confusion and sleep disturbance.    PMFS History:  Patient Active Problem List   Diagnosis Date Noted  . 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 Elbow wound, left, sequela 02/20/2020  . Arthralgia 01/08/2020  . Cigarette smoker 11/20/2019  . OSA on CPAP 11/20/2019  . Morbid obesity due to excess calories ([place]) 11/20/2019  . DOE (dyspnea on exertion) 11/19/2019  . Paresthesia 03/26/2019  . Chronic diastolic heart failure ([place]) 03/09/2018  . COPD GOLD 0 / still smoking  03/09/2018  . 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 Hypertension  Past Medical History:  Diagnosis Date  . AAA (abdominal aortic aneurysm) ([place])   . CAD (coronary artery disease)   . CHF (congestive heart failure) ([place])   . Diverticulosis   . Dyspnea    W/ EXERTION   . Gout   . Gynecomastia   . Hx of  emphysema   . Hyperlipidemia   . Hypertension   . Lumbar spinal stenosis   . OSA (obstructive sleep apnea)   . Pedal edema     Family History  Problem Relation Age of Onset  . Alzheimer's disease Mother   . Heart attack Father   . Leukemia Father   . Hypertension Father   . Gout Maternal Uncle   . Hypertension Son    Past Surgical History:  Procedure Laterality Date  . ABDOMINAL AORTIC ANEURYSM REPAIR  2013 and 2018  . BACK SURGERY    . CARDIAC CATHETERIZATION  04/2016   [place], [place]  . ELBOW ARTHROSCOPY Left 2011  . NECK SURGERY    . THORACIC AORTIC ENDOVASCULAR STENT GRAFT N/A 10/26/2017   Procedure: THORACIC AORTIC ENDOVASCULAR STENT GRAFT WITH ILIAC EXTENSIONS;  Surgeon: Rosetta Posner, MD;  Location: [facility] OR;  Service: Vascular;  Laterality: N/A;  PATIENT WILL NEED SPINAL CORD DRAIN BY ANESTHESIA  . [place] History   Social History Narrative  . Not on file   Immunization History  Administered Date(s) Administered  . Fluad Quad(high Dose 65+) 01/08/2020  . Influenza, High Dose Seasonal PF 02/13/2018  . PFIZER SARS-COV-2 Vaccination 06/12/2019, 07/05/2019     Objective: Vital Signs: BP 132/80 (BP Location: Right Arm, Patient Position: Sitting, Cuff Size: Normal)   Pulse 94   Ht 5' 5.75" (1.67 m)   Wt 245 lb 12.8 oz (111.5 kg)   BMI 39.98 kg/m    Physical Exam Constitutional:      Appearance: He is obese.  HENT:     Right Ear: External ear normal.     Left Ear: External ear normal.     Mouth/Throat:     Mouth: Mucous membranes are moist.     Pharynx: Oropharynx is clear.  Eyes:     Conjunctiva/sclera: Conjunctivae normal.  Cardiovascular:     Rate and Rhythm: Normal rate and regular rhythm.  Pulmonary:     Effort: Pulmonary effort is normal.     Breath sounds: Normal breath sounds.  Skin:    General: Skin is warm and dry.  Neurological:     General: No focal deficit present.     Mental Status: He is alert.      Musculoskeletal Exam:   Neck full range of motion Shoulder, elbows full range of motion, right elbow small nodules present at olecranon bursa left elbow small open incision Right wrist and MCP tenderness and unable to tightly close right hand into a fist, synovitis present in wrist and 2nd-3rd MCP, left hand 2nd MCP pain and stiffness, DIP heberdon's nodes on both hands Knees, ankles, MTPs full range of motion no tenderness    Investigation: No additional findings.  Imaging: NM Myocar Multi W/Spect W/Wall Motion / EF  Result Date: 01/23/2020  There was no ST segment deviation noted during stress.  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  This is an intermediate risk study. Risk based on decreased LVEF, there is no current myocardium at jeopardy. Consider correlating LVEF with echocardiogram.  The left ventricular ejection fraction is moderately decreased (30-44%).    XR Hand 2 View  Left  Result Date: 02/20/2020 X-ray left hand 2 views Radiocarpal joint space appears normal.  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  MCP and PIP joint spaces appear intact mild asymmetric narrowing at DIP joints.  Bone mineralization appears normal.  No soft tissue swelling seen. 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  Cystic changes present in carpal bones and 1st CMC arthritis.  Asymmetric joint space narrowing in thumb joints with osteophytes present.  MCP and PIP joints intact narrowing and rotation present at DIP joints worst at third digit.  Bone mineralization borderline for generalized osteopenia.  No soft tissue swelling seen. 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  Procedures:  No procedures performed Allergies: Patient has no known allergies.   Assessment / Plan:     Visit Diagnoses: Chronic tophaceous gout - Plan: XR Hand 2 View Right, XR Hand 2 View Left Pain of both wrist joints  I suspect polyarticular joint pain is related to the gout based on his chronic disease, uric acid 10.4, and tophi. Xrays do not demonstrate erosive disease. 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 Expect this to improve with continued gout treatment if it fails to do so can consider alternate inflammatory workup. Allopurinol 100mg  daily for hyperurecemia and colchicine 0.6mg  daily for prophylaxis. F/U in 4 weeks to repeat labs and titrate dose.  Elbow wound, left, sequela - Plan: Ambulatory referral to Orthopedic Surgery  The incision over his left elbow has apparently failed to close properly for many months no evidence of infection seen but will recommend exam with orthopedics to ensure no washout or procedure needed and would probably benefit with suture. Referral placed today.  Orders: Orders Placed This Encounter  Procedures  . XR Hand 2 View Right  . XR Hand 2 View Left  . Ambulatory referral to Orthopedic Surgery   No orders of the defined types were placed in this encounter.   Follow-Up Instructions: Return in about 4 weeks (around 03/19/2020) for Gout new  f/u.   [name], MD  Note - This record has been created using [employer].  Chart creation errors have been sought, but may not always  have been located. Such creation errors do not reflect on  the standard of medical care.

## 2020-02-21 ENCOUNTER — Other Ambulatory Visit: Payer: Self-pay | Admitting: Internal Medicine

## 2020-02-21 DIAGNOSIS — M1A9XX1 Chronic gout, unspecified, with tophus (tophi): Secondary | ICD-10-CM

## 2020-02-21 MED ORDER — ALLOPURINOL 100 MG PO TABS: 100.0000 mg | ORAL_TABLET | Freq: Every day | ORAL | 0 refills | Status: DC

## 2020-02-21 NOTE — Telephone Encounter (Signed)
Patient calling in reference to a rx for medication that was suppose to be sent into his pharmacy yesterday. RX for Allopurinol sent to [facility] in [place]. Please call patient to advise.

## 2020-02-21 NOTE — Telephone Encounter (Signed)
In Dr. Gregary Cromer note the following is mentioned:   Start taking Allopurinol 100 mg by mouth daily, however I do not see where Rx was sent to pharmacy. Rx has been pended, please sign and send to pharmacy. Thanks!

## 2020-02-21 NOTE — Progress Notes (Incomplete)
Office Visit Note  Patient: Jonathan Garner.             Date of Birth: 04-Apr-1945           MRN: [id number]             PCP: Carolee Rota, NP Referring: Rigoberto Noel, MD Visit Date: 02/20/2020   Subjective:  New Patient (Initial Visit) (Patient currently complains of right hand/wrist pain and some discomfort in the left pointer finger. Patient also complains of left elbow pain and swelling that is seeping fluid. ) and Gout (Patient recently had a gout attack after eating shrimp. Patient was recently given Prednisone dose pack, he finished yesterday, and Colchicine which have improved symptoms. )   History of Present Illness: Jonathan Morandi. is a 75 y.o. male with diastolic CHF, COPD, HTN, and OSA here for evaluation of pain and stiffness in bilateral hands. He has a history of gout for years typically attacks in the foot provoked by eating seafood. He usually had only 1-2 episodes per year and has never taken medications for this besides as needed antiinflammatory medicine most beneficial has been prednisone taper. More recently he was having pain and stiffness of both hands which is not typical for him. This was then exacerbated with severe pain and swelling in the right hand and wrist last month he thinks this was provoked after eating a lot of seafood around Christmas. It is improved today after prednisone taper. He has also had a persistent left elbow nodule this was drained in his primary care office with white chalklike contents noted and negative for infection. However this has continued to drain fluid slowly and failed to fully heal now for many months.  Labs reviewed 12/2019 ANA negative ESR 20 Uric acid 10.4 CCP neg   Activities of Daily Living:  Patient reports morning stiffness for 24 hours.   Patient Reports nocturnal pain.  Difficulty dressing/grooming: Denies Difficulty climbing stairs: Reports Difficulty getting out of chair: Reports Difficulty using hands  for taps, buttons, cutlery, and/or writing: Reports  Review of Systems  Constitutional: Negative for fatigue.  HENT: Negative for mouth sores, mouth dryness and nose dryness.   Eyes: Positive for dryness. Negative for pain, itching and visual disturbance.  Respiratory: Positive for shortness of breath. Negative for cough, hemoptysis and difficulty breathing.   Cardiovascular: Positive for swelling in legs/feet. Negative for chest pain and palpitations.  Gastrointestinal: Negative for abdominal pain, blood in stool, constipation and diarrhea.  Endocrine: Negative for increased urination.  Genitourinary: Negative for painful urination.  Musculoskeletal: Positive for arthralgias, joint pain, joint swelling and morning stiffness. Negative for myalgias, muscle weakness, muscle tenderness and myalgias.  Skin: Negative for color change, rash and redness.  Allergic/Immunologic: Negative for susceptible to infections.  Neurological: Positive for numbness. Negative for dizziness, headaches, memory loss and weakness.  Hematological: Negative for swollen glands.  Psychiatric/Behavioral: Negative for confusion and sleep disturbance.    PMFS History:  Patient Active Problem List   Diagnosis Date Noted  . 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 Elbow wound, left, sequela 02/20/2020  . Arthralgia 01/08/2020  . Cigarette smoker 11/20/2019  . OSA on CPAP 11/20/2019  . Morbid obesity due to excess calories ([place]) 11/20/2019  . DOE (dyspnea on exertion) 11/19/2019  . Paresthesia 03/26/2019  . Chronic diastolic heart failure ([place]) 03/09/2018  . COPD GOLD 0 / still smoking  03/09/2018  . 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 Hypertension  Past Medical History:  Diagnosis Date  . AAA (abdominal aortic aneurysm) ([place])   . CAD (coronary artery disease)   . CHF (congestive heart failure) ([place])   . Diverticulosis   . Dyspnea    W/ EXERTION   . Gout   . Gynecomastia   . Hx of  emphysema   . Hyperlipidemia   . Hypertension   . Lumbar spinal stenosis   . OSA (obstructive sleep apnea)   . Pedal edema     Family History  Problem Relation Age of Onset  . Alzheimer's disease Mother   . Heart attack Father   . Leukemia Father   . Hypertension Father   . Gout Maternal Uncle   . Hypertension Son    Past Surgical History:  Procedure Laterality Date  . ABDOMINAL AORTIC ANEURYSM REPAIR  2013 and 2018  . BACK SURGERY    . CARDIAC CATHETERIZATION  04/2016   [place], [place]  . ELBOW ARTHROSCOPY Left 2011  . NECK SURGERY    . THORACIC AORTIC ENDOVASCULAR STENT GRAFT N/A 10/26/2017   Procedure: THORACIC AORTIC ENDOVASCULAR STENT GRAFT WITH ILIAC EXTENSIONS;  Surgeon: Rosetta Posner, MD;  Location: [facility] OR;  Service: Vascular;  Laterality: N/A;  PATIENT WILL NEED SPINAL CORD DRAIN BY ANESTHESIA  . [place] History   Social History Narrative  . Not on file   Immunization History  Administered Date(s) Administered  . Fluad Quad(high Dose 65+) 01/08/2020  . Influenza, High Dose Seasonal PF 02/13/2018  . PFIZER SARS-COV-2 Vaccination 06/12/2019, 07/05/2019     Objective: Vital Signs: BP 132/80 (BP Location: Right Arm, Patient Position: Sitting, Cuff Size: Normal)   Pulse 94   Ht 5' 5.75" (1.67 m)   Wt 245 lb 12.8 oz (111.5 kg)   BMI 39.98 kg/m    Physical Exam Constitutional:      Appearance: He is obese.  HENT:     Right Ear: External ear normal.     Left Ear: External ear normal.     Mouth/Throat:     Mouth: Mucous membranes are moist.     Pharynx: Oropharynx is clear.  Eyes:     Conjunctiva/sclera: Conjunctivae normal.  Cardiovascular:     Rate and Rhythm: Normal rate and regular rhythm.  Pulmonary:     Effort: Pulmonary effort is normal.     Breath sounds: Normal breath sounds.  Skin:    General: Skin is warm and dry.  Neurological:     General: No focal deficit present.     Mental Status: He is alert.      Musculoskeletal Exam:   Neck full range of motion Shoulder, elbows full range of motion, right elbow small nodules present at olecranon bursa left elbow small open incision Right wrist and MCP tenderness and unable to tightly close right hand into a ist, No paraspinal tenderness to palpation over upper and lower back Normal hip internal and external rotation without pain, no tenderness to lateral hip palpation Knees, ankles, MTPs full range of motion no tenderness or swelling    Investigation: No additional findings.  Imaging: NM Myocar Multi W/Spect W/Wall Motion / EF  Result Date: 01/23/2020  There was no ST segment deviation noted during stress.  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  This is an intermediate risk study. Risk based on decreased LVEF, there is no current myocardium at jeopardy. Consider correlating LVEF with echocardiogram.  The left ventricular ejection fraction is moderately decreased (30-44%).  XR Hand 2 View Left  Result Date: 02/20/2020 X-ray left hand 2 views Radiocarpal joint space appears normal.  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  MCP and PIP joint spaces appear intact mild asymmetric narrowing at DIP joints.  Bone mineralization appears normal.  No soft tissue swelling seen. 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  Cystic changes present in carpal bones and 1st CMC arthritis.  Asymmetric joint space narrowing in thumb joints with osteophytes present.  MCP and PIP joints intact narrowing and rotation present at DIP joints worst at third digit.  Bone mineralization borderline for generalized osteopenia.  No soft tissue swelling seen. 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  Procedures:  No procedures performed Allergies: Patient has no known allergies.   Assessment / Plan:     Visit Diagnoses: Chronic tophaceous gout - Plan: XR Hand 2 View Right, XR Hand 2 View Left, Ambulatory referral to Orthopedic Surgery  Pain of both wrist joints  Elbow wound, left, sequela - Plan: Ambulatory referral to Orthopedic Surgery  Orders: Orders Placed This Encounter  Procedures  . XR Hand 2 View Right  . XR Hand 2 View Left  . Ambulatory referral to Orthopedic Surgery   No orders of the defined types were placed in this encounter.   Face-to-face time spent with patient was *** minutes. Greater than 50% of time was spent in counseling and coordination of care.  Follow-Up Instructions: Return in about 4 weeks (around 03/19/2020) for Gout new f/u.   [name], MD  Note - This record has been created using [employer].  Chart creation errors have been sought, but may not always  have been located. Such creation errors do not reflect on  the standard of medical care.

## 2020-02-27 NOTE — Progress Notes (Signed)
Hand xrays show osteoarthritis, degenerative wear and tear type changes to the joints. There is no evidence of erosions or permanent damage from the gout.

## 2020-02-28 ENCOUNTER — Ambulatory Visit (INDEPENDENT_AMBULATORY_CARE_PROVIDER_SITE_OTHER): Payer: Medicare HMO

## 2020-02-28 ENCOUNTER — Encounter: Payer: Self-pay | Admitting: Orthopaedic Surgery

## 2020-02-28 ENCOUNTER — Other Ambulatory Visit: Payer: Self-pay

## 2020-02-28 ENCOUNTER — Ambulatory Visit (INDEPENDENT_AMBULATORY_CARE_PROVIDER_SITE_OTHER): Payer: Medicare HMO | Admitting: Orthopaedic Surgery

## 2020-02-28 VITALS — Ht 66.0 in | Wt 240.0 lb

## 2020-02-28 DIAGNOSIS — M1A9XX1 Chronic gout, unspecified, with tophus (tophi): Secondary | ICD-10-CM

## 2020-02-28 DIAGNOSIS — M25522 Pain in left elbow: Secondary | ICD-10-CM

## 2020-02-28 MED ORDER — COLCHICINE 0.6 MG PO TABS
0.6000 mg | ORAL_TABLET | Freq: Three times a day (TID) | ORAL | 0 refills | Status: DC | PRN
Start: 2020-02-28 — End: 2020-04-04

## 2020-02-29 NOTE — Progress Notes (Signed)
Office Visit Note   Patient: Jonathan Garner.           Date of Birth: 04-17-1945           MRN: [id number] Visit Date: 02/28/2020              Requested by: [name], MD [address] [place],  [place] [address] PCP: Carolee Rota, NP   Assessment & Plan: Visit Diagnoses:  1. Pain in left elbow   2. Chronic tophaceous gout     Plan: I discussed with patient that olecranon bursectomy would not likely he will until his uric acid is brought under control.  Needs to take ibuprofen regularly 800 mg twice daily for several weeks as he gradually ramped up his allopurinol from 100 mg to 200 mg for 1 week and then increase to 300 mg.  He can take colchicine if he gets a flare in gout if that does not work start some short-term prednisone.  I plan to check him in 4 weeks.  We wrote down specific instructions for medications.  Once he is on 300 mg allopurinol this should gradually bring down his uric acid and take care of his multiple joint complaints.  Then if he still having some drainage from elbow then surgery could be considered.  We discussed problems with joint destruction with longstanding gout.  Follow-Up Instructions: Return in about 4 weeks (around 03/27/2020).   Orders:  Orders Placed This Encounter  Procedures  . XR Elbow 2 Views Left   Meds ordered this encounter  Medications  . colchicine 0.6 MG tablet    Sig: Take 1 tablet (0.6 mg total) by mouth 3 (three) times daily as needed.    Dispense:  30 tablet    Refill:  0      Procedures: No procedures performed   Clinical Data: No additional findings.   Subjective: Chief Complaint  Patient presents with  . Left Elbow - Pain    HPI 75 year old male said left olecranon bursitis lanced x2 and drained with recurrence and continued drainage.  Has history of gout is uric acid was 10.4 in November.  He is on allopurinol 100 mg and when he has had flares he is used colchicine in the past but is  currently out.  He has ibuprofen that he is used as well.  Multiple joint involvement.  Review of Systems positive for gout, joint pain, morbid obesity.  Repair dissecting thoracic aneurysm history of cigarette smoking COPD Gold and diastolic heart failure not currently symptomatic.   Objective: Vital Signs: Ht 5\' 6"  (1.676 m)   Wt 240 lb (108.9 kg)   BMI 38.74 kg/m   Physical Exam Constitutional:      Appearance: He is well-developed and well-nourished.  HENT:     Head: Normocephalic and atraumatic.  Eyes:     Extraocular Movements: EOM normal.     Pupils: Pupils are equal, round, and reactive to light.  Neck:     Thyroid: No thyromegaly.     Trachea: No tracheal deviation.  Cardiovascular:     Rate and Rhythm: Normal rate.  Pulmonary:     Effort: Pulmonary effort is normal.     Breath sounds: No wheezing.  Abdominal:     General: Bowel sounds are normal.     Palpations: Abdomen is soft.  Skin:    General: Skin is warm and dry.     Capillary Refill: Capillary refill takes less than 2  seconds.  Neurological:     Mental Status: He is alert and oriented to person, place, and time.  Psychiatric:        Mood and Affect: Mood and affect normal.        Behavior: Behavior normal.        Thought Content: Thought content normal.        Judgment: Judgment normal.     Ortho Exam Pinpoint drainage left elbow olecranon bursa with a Band-Aid.  Olecranon bursitis opposite right ankle.  Decreased elbow range of motion left.  Arthritis CMC joints MCP PIP joints appear spared with decreased range of motion DIP. Specialty Comments:  No specialty comments available.  Imaging: XR Elbow 2 Views Left  Result Date: 02/28/2020 2 view x-rays left elbow obtained and reviewed this shows significant spurring joint space loss with osteoarthritic changes left elbow. 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    PMFS History: Patient Active Problem List   Diagnosis Date Noted   . 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 Elbow wound, left, sequela 02/20/2020  . Arthralgia 01/08/2020  . Cigarette smoker 11/20/2019  . OSA on CPAP 11/20/2019  . Morbid obesity due to excess calories ([facility]) 11/20/2019  . DOE (dyspnea on exertion) 11/19/2019  . Paresthesia 03/26/2019  . Chronic diastolic heart failure ([facility]) 03/09/2018  . COPD GOLD 0 / still smoking  03/09/2018  . 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 Hypertension    Past Medical History:  Diagnosis Date  . AAA (abdominal aortic aneurysm) ([facility])   . CAD (coronary artery disease)   . CHF (congestive heart failure) ([facility])   . Diverticulosis   . Dyspnea    W/ EXERTION   . Gout   . Gynecomastia   . Hx of emphysema   . Hyperlipidemia   . Hypertension   . Lumbar spinal stenosis   . OSA (obstructive sleep apnea)   . Pedal edema     Family History  Problem Relation Age of Onset  . Alzheimer's disease Mother   . Heart attack Father   . Leukemia Father   . Hypertension Father   . Gout Maternal Uncle   . Hypertension Son     Past Surgical History:  Procedure Laterality Date  . ABDOMINAL AORTIC ANEURYSM REPAIR  2013 and 2018  . BACK SURGERY    . CARDIAC CATHETERIZATION  04/2016   [place], [place]  . ELBOW ARTHROSCOPY Left 2011  . NECK SURGERY    . THORACIC AORTIC ENDOVASCULAR STENT GRAFT N/A 10/26/2017   Procedure: THORACIC AORTIC ENDOVASCULAR STENT GRAFT WITH ILIAC EXTENSIONS;  Surgeon: Rosetta Posner, MD;  Location: [facility];  Service: Vascular;  Laterality: N/A;  PATIENT WILL NEED SPINAL CORD DRAIN BY ANESTHESIA  . VASECTOMY  1976   Social History   Occupational History  . Not on file  Tobacco Use  . Smoking status: Current Every Day Smoker    Packs/day: 0.25    Years: 58.00    Pack years: 14.50    Types: Cigars, Cigarettes    Start date: 11/15/1961  . Smokeless tobacco: Never Used  Vaping Use  . Vaping Use: Former  Substance and Sexual Activity  . Alcohol use: Yes    Comment:  occasionally  . Drug use: Never  . Sexual activity: Not on file

## 2020-03-06 DIAGNOSIS — H25813 Combined forms of age-related cataract, bilateral: Secondary | ICD-10-CM | POA: Diagnosis not present

## 2020-03-06 DIAGNOSIS — Z0100 Encounter for examination of eyes and vision without abnormal findings: Secondary | ICD-10-CM | POA: Diagnosis not present

## 2020-03-06 DIAGNOSIS — H5200 Hypermetropia, unspecified eye: Secondary | ICD-10-CM | POA: Diagnosis not present

## 2020-03-20 NOTE — Progress Notes (Signed)
Office Visit Note  Patient: Jonathan Garner.             Date of Birth: [birth year]           MRN: [id number]             PCP: Carolee Rota, NP Referring: Carolee Rota, NP Visit Date: 03/21/2020   Subjective:  Follow-up (Patient complains of pain in the right middle and index fingers. Patient denies recent gout flares. Patient was evaluated by Dr. Lorin Mercy for the left elbow. )   History of Present Illness: Jonathan Sally. is a 75 y.o. male here for follow up of chronic, polyarticular, tophaceous gout. He is taking allopurinol and after visit with Dr. Lorin Mercy increased his dose to 300mg  total daily. He stopped taking any colchicine. He denies new gout flare but is having persistent pain, swelling, and stiffness affecting his right 2nd and 3rd digits. Ankle swelling is partially better, still has some worse on the right. His elbow continues to drain some fluid but he thinks less than before.   Review of Systems  Constitutional: Negative for fatigue.  HENT: Negative for mouth sores, mouth dryness and nose dryness.   Eyes: Positive for visual disturbance and dryness. Negative for pain and itching.  Respiratory: Positive for shortness of breath. Negative for cough, hemoptysis and difficulty breathing.   Cardiovascular: Positive for swelling in legs/feet. Negative for chest pain and palpitations.  Gastrointestinal: Negative for abdominal pain, blood in stool, constipation and diarrhea.  Endocrine: Negative for increased urination.  Genitourinary: Negative for painful urination.  Musculoskeletal: Positive for arthralgias, joint pain, joint swelling and morning stiffness. Negative for myalgias, muscle weakness, muscle tenderness and myalgias.  Skin: Negative for color change, rash and redness.  Allergic/Immunologic: Negative for susceptible to infections.  Neurological: Negative for dizziness, numbness, headaches, memory loss and weakness.  Hematological: Negative for swollen glands.   Psychiatric/Behavioral: Negative for confusion and sleep disturbance.    PMFS History:  Patient Active Problem List   Diagnosis Date Noted   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   Elbow wound, left, sequela 02/20/2020   Arthralgia 01/08/2020   Cigarette smoker 11/20/2019   OSA on CPAP 11/20/2019   Morbid obesity due to excess calories ([facility]) 11/20/2019   DOE (dyspnea on exertion) 11/19/2019   Paresthesia 03/26/2019   Chronic diastolic heart failure ([facility]) 03/09/2018   COPD GOLD 0 / still smoking  03/09/2018   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   Hypertension     Past Medical History:  Diagnosis Date   AAA (abdominal aortic aneurysm) ([facility])    CAD (coronary artery disease)    CHF (congestive heart failure) ([facility])    Diverticulosis    Dyspnea    W/ EXERTION    Gout    Gynecomastia    Hx of emphysema    Hyperlipidemia    Hypertension    Lumbar spinal stenosis    OSA (obstructive sleep apnea)    Pedal edema     Family History  Problem Relation Age of Onset   Alzheimer's disease Mother    Heart attack Father    Leukemia Father    Hypertension Father    Gout Maternal Uncle    Hypertension Son    Past Surgical History:  Procedure Laterality Date   ABDOMINAL AORTIC ANEURYSM REPAIR  2013 and 2018   BACK SURGERY     CARDIAC CATHETERIZATION  04/2016   [place], [place]   ELBOW ARTHROSCOPY Left 2011  NECK SURGERY     THORACIC AORTIC ENDOVASCULAR STENT GRAFT N/A 10/26/2017   Procedure: THORACIC AORTIC ENDOVASCULAR STENT GRAFT WITH ILIAC EXTENSIONS;  Surgeon: Rosetta Posner, MD;  Location: [facility];  Service: Vascular;  Laterality: N/A;  PATIENT WILL NEED SPINAL CORD DRAIN BY ANESTHESIA   VASECTOMY  1976   Social History   Social History Narrative   Not on file   Immunization History  Administered Date(s) Administered   Fluad Quad(high Dose 65+) 01/08/2020   Influenza, High Dose Seasonal PF 02/13/2018   PFIZER(Purple  Top)SARS-COV-2 Vaccination 06/12/2019, 07/05/2019     Objective: Vital Signs: BP 130/70 (BP Location: Right Arm, Patient Position: Sitting, Cuff Size: Large)    Pulse 85    Ht 5\' 6"  (1.676 m)    Wt 250 lb (113.4 kg)    BMI 40.35 kg/m    Physical Exam Constitutional:      Appearance: He is obese.  Eyes:     Conjunctiva/sclera: Conjunctivae normal.  Skin:    General: Skin is warm and dry.     Findings: No rash.  Neurological:     General: No focal deficit present.     Mental Status: He is alert.  Psychiatric:        Mood and Affect: Mood normal.     Musculoskeletal Exam:  Elbow, wrists full range of motion no tenderness or swelling Right 2nd and 3rd pip joints are swollen with reduced ROM and pain and hard nodules present on dorsal aspect, rest of the hands is normal Knees full range of motion no tenderness or swelling R>L ankle swelling with good ROM and minimal tenderness   Investigation: No additional findings.  Imaging: XR Elbow 2 Views Left  Result Date: 02/28/2020 2 view x-rays left elbow obtained and reviewed this shows significant spurring joint space loss with osteoarthritic changes left elbow. 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  XR Hand 2 View Left  Result Date: 02/20/2020 X-ray left hand 2 views Radiocarpal joint space appears normal.  Carpal carpal joints intact significant osteoarthritis at first [facility] joint and joint space narrowing with osteophytes at thumb joints.  MCP and PIP joint spaces appear intact mild asymmetric narrowing at DIP joints.  Bone mineralization appears normal.  No soft tissue swelling seen. Impression Degenerative arthritis changes worst at first [facility] joint and thumb joints  XR Hand 2 View Right  Result Date: 02/20/2020 X-ray right hand 2 views Radiocarpal joint space appears intact with irregular surface of radial head.  Cystic changes present in carpal bones and 1st CMC arthritis.  Asymmetric joint space narrowing in  thumb joints with osteophytes present.  MCP and PIP joints intact narrowing and rotation present at DIP joints worst at third digit.  Bone mineralization borderline for generalized osteopenia.  No soft tissue swelling seen. Impression Degenerative arthritis changes worst at wrist thumb and distal finger joints no erosions seen   Recent Labs: Lab Results  Component Value Date   WBC 7.5 01/08/2020   HGB 13.9 01/08/2020   PLT 291 01/08/2020   NA 137 01/08/2020   K 4.2 01/08/2020   CL 106 01/08/2020   CO2 21 (L) 01/08/2020   GLUCOSE 104 (H) 01/08/2020   BUN 22 01/08/2020   CREATININE 1.12 01/08/2020   BILITOT 0.6 01/08/2020   ALKPHOS 107 01/08/2020   AST 17 01/08/2020   ALT 17 01/08/2020   PROT 7.5 01/08/2020   ALBUMIN 4.1 01/08/2020   CALCIUM 9.0 01/08/2020   GFRAA >60 11/19/2019  Speciality Comments: No specialty comments available.  Procedures:  No procedures performed Allergies: Patient has no known allergies.   Assessment / Plan:     Visit Diagnoses: Chronic tophaceous gout - Plan: CBC with Differential/Platelet, Basic Metabolic Panel (BMET), Uric acid  He is tolerating the allopurinol without problems now on 300mg  daily dose. Will repeat uric acid and if still above goal continue to increase by 100mg  increments. Recommend he restart colchicine at this time for the finger pain and swelling if this improves he can go down to 1 daily but would not discontinue entirely with the amount of inflammation. If it fails to improved discussed he can return for steroid injection.  Orders: Orders Placed This Encounter  Procedures   CBC with Differential/Platelet   Basic Metabolic Panel (BMET)   Uric acid   No orders of the defined types were placed in this encounter.   Follow-Up Instructions: Return in about 4 weeks (around 04/18/2020) for Gout f/u.   [name], MD  Note - This record has been created using [employer].  Chart creation errors have been sought,  but may not always  have been located. Such creation errors do not reflect on  the standard of medical care.

## 2020-03-21 ENCOUNTER — Encounter: Payer: Self-pay | Admitting: Internal Medicine

## 2020-03-21 ENCOUNTER — Other Ambulatory Visit: Payer: Self-pay

## 2020-03-21 ENCOUNTER — Ambulatory Visit: Payer: Medicare HMO | Admitting: Internal Medicine

## 2020-03-21 VITALS — BP 130/70 | HR 85 | Ht 66.0 in | Wt 250.0 lb

## 2020-03-21 DIAGNOSIS — M1A9XX1 Chronic gout, unspecified, with tophus (tophi): Secondary | ICD-10-CM

## 2020-03-21 NOTE — Patient Instructions (Addendum)
I recommend taking the colchicine again twice daily until you notice some improvement in the current joint stiffness and swelling. I would just decrease back to 1 daily if it improves but not stop completely since the gout will continue to be a problem until under better control.  If the pain is not getting any better despite this in a week or two, you can let us know we could schedule for injection of problematic joints.  We are checking uric acid, if it is still above 6 will recommend you keep going up on the allopurinol and will call after seeing result.

## 2020-03-22 LAB — CBC WITH DIFFERENTIAL/PLATELET
Absolute Monocytes: 956 cells/uL — ABNORMAL HIGH (ref 200–950)
Basophils Absolute: 63 cells/uL (ref 0–200)
Basophils Relative: 0.8 %
Eosinophils Absolute: 221 cells/uL (ref 15–500)
Eosinophils Relative: 2.8 %
HCT: 40.7 % (ref 38.5–50.0)
Hemoglobin: 13.6 g/dL (ref 13.2–17.1)
Lymphs Abs: 2299 cells/uL (ref 850–3900)
MCH: 29 pg (ref 27.0–33.0)
MCHC: 33.4 g/dL (ref 32.0–36.0)
MCV: 86.8 fL (ref 80.0–100.0)
MPV: 10.3 fL (ref 7.5–12.5)
Monocytes Relative: 12.1 %
Neutro Abs: 4361 cells/uL (ref 1500–7800)
Neutrophils Relative %: 55.2 %
Platelets: 269 10*3/uL (ref 140–400)
RBC: 4.69 10*6/uL (ref 4.20–5.80)
RDW: 14.3 % (ref 11.0–15.0)
Total Lymphocyte: 29.1 %
WBC: 7.9 10*3/uL (ref 3.8–10.8)

## 2020-03-22 LAB — BASIC METABOLIC PANEL
BUN: 24 mg/dL (ref 7–25)
CO2: 29 mmol/L (ref 20–32)
Calcium: 9.6 mg/dL (ref 8.6–10.3)
Chloride: 106 mmol/L (ref 98–110)
Creat: 0.98 mg/dL (ref 0.70–1.18)
Glucose, Bld: 90 mg/dL (ref 65–99)
Potassium: 4.5 mmol/L (ref 3.5–5.3)
Sodium: 142 mmol/L (ref 135–146)

## 2020-03-22 LAB — URIC ACID: Uric Acid, Serum: 6.9 mg/dL (ref 4.0–8.0)

## 2020-03-30 NOTE — Progress Notes (Signed)
Labs look okay but uric acid is still high currently 6.9. Recommend increasing allopurinol again to 400mg  daily alsp to stay on the low dose colchicine for now and we will need to check again.

## 2020-04-03 ENCOUNTER — Other Ambulatory Visit: Payer: Self-pay

## 2020-04-03 ENCOUNTER — Ambulatory Visit (INDEPENDENT_AMBULATORY_CARE_PROVIDER_SITE_OTHER): Payer: Medicare HMO | Admitting: Orthopaedic Surgery

## 2020-04-03 ENCOUNTER — Encounter: Payer: Self-pay | Admitting: Orthopaedic Surgery

## 2020-04-03 VITALS — Ht 66.0 in | Wt 250.0 lb

## 2020-04-03 DIAGNOSIS — M1A9XX1 Chronic gout, unspecified, with tophus (tophi): Secondary | ICD-10-CM

## 2020-04-03 DIAGNOSIS — M545 Low back pain, unspecified: Secondary | ICD-10-CM | POA: Diagnosis not present

## 2020-04-03 DIAGNOSIS — M5441 Lumbago with sciatica, right side: Secondary | ICD-10-CM

## 2020-04-03 DIAGNOSIS — G8929 Other chronic pain: Secondary | ICD-10-CM

## 2020-04-03 MED ORDER — ALLOPURINOL 300 MG PO TABS: 300.0000 mg | ORAL_TABLET | Freq: Every day | ORAL | 6 refills | Status: DC

## 2020-04-03 NOTE — Progress Notes (Signed)
Office Visit Note   Patient: Jonathan Garner.           Date of Birth: [birth year]           MRN: [id number] Visit Date: 04/03/2020              Requested by: Jonathan Rota, NP [address]  [place] [address] PCP: Jonathan Rota, NP   Assessment & Plan: Visit Diagnoses:  1. Chronic bilateral low back pain, unspecified whether sciatica present   2. Chronic tophaceous gout   3. Chronic bilateral low back pain with right-sided sciatica     Plan: We will proceed with MRI scan lumbar spine for evaluation of low back pain with right leg L5 radiculopathy.  Follow-Up Instructions: No follow-ups on file.   Orders:  Orders Placed This Encounter  Procedures  . MR Lumbar Spine w/o contrast   Meds ordered this encounter  Medications  . allopurinol (ZYLOPRIM) 300 MG tablet    Sig: Take 1 tablet (300 mg total) by mouth daily.    Dispense:  30 tablet    Refill:  6  . colchicine 0.6 MG tablet    Sig: Take 1 tablet (0.6 mg total) by mouth 3 (three) times daily as needed.    Dispense:  30 tablet    Refill:  0      Procedures: No procedures performed   Clinical Data: No additional findings.   Subjective: Chief Complaint  Patient presents with  . Left Elbow - Follow-up    HPI 75 year old male with tophaceous gout now on allopurinol 400mg  daily after seeing Jonathan Garner rheumatologist.  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  2 months ago uric acid was 10.42 weeks ago it was 6.9. Chronic LBP worse right than left leg.  Patient said more weakness in his right leg than left numbness and states he has numbness and burning that goes down to the dorsum of his right foot more than left foot.  He states at times when he turns over it wakes him up at night.  Increased pain with bending turning and twisting.  Review of Systems 14 point  system update unchanged 02/28/2020.   Objective: Vital Signs: Ht 5\' 6"  (1.676 m)   Wt 250 lb (113.4 kg)   BMI 40.35 kg/m   Physical Exam Constitutional:      Appearance: He is well-developed and well-nourished.  HENT:     Head: Normocephalic and atraumatic.  Eyes:     Extraocular Movements: EOM normal.     Pupils: Pupils are equal, round, and reactive to light.  Neck:     Thyroid: No thyromegaly.     Trachea: No tracheal deviation.  Cardiovascular:     Rate and Rhythm: Normal rate.  Pulmonary:     Effort: Pulmonary effort is normal.     Breath sounds: No wheezing.  Abdominal:     General: Bowel sounds are normal.     Palpations: Abdomen is soft.  Skin:    General: Skin is warm and dry.     Capillary Refill: Capillary refill takes less than 2 seconds.  Neurological:     Mental Status: He is alert and oriented to person, place, and time.  Psychiatric:        Mood and Affect: Mood and affect normal.  Behavior: Behavior normal.        Thought Content: Thought content normal.        Judgment: Judgment normal.     Ortho Exam patient has pinpoint area with continued drainage wearing a Band-Aid from gouty tophi good drainage.  Decreased flexion of his right index through small fingers but with wrist extension he can get his fingertips to distal palmar crease with some tightness primarily PIP joints.  Patient has sciatic notch tenderness pain with straight leg raising.  Patient has decreased sensation dorsum right foot some one half grade anterior tib weakness on the right and EHL weakness.  Peroneals gastrocsoleus are strong right and left. Specialty Comments:  No specialty comments available.  Imaging: No results found.   PMFS History: Patient Active Problem List   Diagnosis Date Noted  . Chronic low back pain 04/04/2020  . 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 Elbow wound, left, sequela 02/20/2020  . Arthralgia 01/08/2020  . Cigarette smoker 11/20/2019  . OSA  on CPAP 11/20/2019  . Morbid obesity due to excess calories ([facility] [place]) 11/20/2019  . DOE (dyspnea on exertion) 11/19/2019  . Paresthesia 03/26/2019  . Chronic diastolic heart failure ([place]) 03/09/2018  . COPD GOLD 0 / still smoking  03/09/2018  . 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 Hypertension    Past Medical History:  Diagnosis Date  . AAA (abdominal aortic aneurysm) ([facility])   . CAD (coronary artery disease)   . CHF (congestive heart failure) ([facility])   . Diverticulosis   . Dyspnea    W/ EXERTION   . Gout   . Gynecomastia   . Hx of emphysema   . Hyperlipidemia   . Hypertension   . Lumbar spinal stenosis   . OSA (obstructive sleep apnea)   . Pedal edema     Family History  Problem Relation Age of Onset  . Alzheimer's disease Mother   . Heart attack Father   . Leukemia Father   . Hypertension Father   . Gout Maternal Uncle   . Hypertension Son     Past Surgical History:  Procedure Laterality Date  . ABDOMINAL AORTIC ANEURYSM REPAIR  2013 and 2018  . BACK SURGERY    . CARDIAC CATHETERIZATION  04/2016   [place], [place]  . ELBOW ARTHROSCOPY Left 2011  . NECK SURGERY    . THORACIC AORTIC ENDOVASCULAR STENT GRAFT N/A 10/26/2017   Procedure: THORACIC AORTIC ENDOVASCULAR STENT GRAFT WITH ILIAC EXTENSIONS;  Surgeon: Jonathan Posner, MD;  Location: [facility];  Service: Vascular;  Laterality: N/A;  PATIENT WILL NEED SPINAL CORD DRAIN BY ANESTHESIA  . VASECTOMY  1976   Social History   Occupational History  . Not on file  Tobacco Use  . Smoking status: Current Every Day Smoker    Packs/day: 0.25    Years: 58.00    Pack years: 14.50    Types: Cigars, Cigarettes    Start date: 11/15/1961  . Smokeless tobacco: Never Used  Vaping Use  . Vaping Use: Former  Substance and Sexual Activity  . Alcohol use: Yes    Comment: occasionally  . Drug use: Never  . Sexual activity: Not on file

## 2020-04-04 DIAGNOSIS — M545 Low back pain, unspecified: Secondary | ICD-10-CM | POA: Insufficient documentation

## 2020-04-04 MED ORDER — COLCHICINE 0.6 MG PO TABS
0.6000 mg | ORAL_TABLET | Freq: Three times a day (TID) | ORAL | 0 refills | Status: DC | PRN
Start: 2020-04-04 — End: 2020-04-21

## 2020-04-10 DIAGNOSIS — G4733 Obstructive sleep apnea (adult) (pediatric): Secondary | ICD-10-CM | POA: Diagnosis not present

## 2020-04-14 DIAGNOSIS — M5126 Other intervertebral disc displacement, lumbar region: Secondary | ICD-10-CM | POA: Diagnosis not present

## 2020-04-14 DIAGNOSIS — M5127 Other intervertebral disc displacement, lumbosacral region: Secondary | ICD-10-CM | POA: Diagnosis not present

## 2020-04-14 DIAGNOSIS — M47816 Spondylosis without myelopathy or radiculopathy, lumbar region: Secondary | ICD-10-CM | POA: Diagnosis not present

## 2020-04-14 DIAGNOSIS — R531 Weakness: Secondary | ICD-10-CM | POA: Diagnosis not present

## 2020-04-20 NOTE — Progress Notes (Signed)
Office Visit Note  Patient: Jonathan Garner.             Date of Birth: 1945/10/19           MRN: [id number]             PCP: Carolee Rota, NP Referring: Carolee Rota, NP Visit Date: 04/21/2020   Subjective:  Gout (Patient is taking 300 mg of Allopurinol daily and Colchicine PRN. )   History of Present Illness: Jonathan Garner. is a 75 y.o. male here for follow up of chronic tophaceous gout. His uric acid remained high at 6.9 and recommended increase in allopurinol dose to 400 mg daily and to take low dose colchicine 0.6 mg daily. He has not consistently been taking the allopurinol at an increased dose and is also taking the colchicine as needed for pain, less than once daily. He feels there is some improvement in joint pain and stiffness mostly has problems in the wrist and 2-3rd digits of right hand and also with foot pain and swelling and numbness especially in the top of the right foot, for which he is being evaluated by Dr. Lorin Mercy with orthopedics. His drainage from the left elbow is partially decreased.   Review of Systems  Constitutional: Negative for fatigue.  HENT: Negative for mouth sores, mouth dryness and nose dryness.   Eyes: Negative for pain, itching, visual disturbance and dryness.  Respiratory: Positive for shortness of breath. Negative for cough, hemoptysis and difficulty breathing.   Cardiovascular: Negative for chest pain, palpitations and swelling in legs/feet.  Gastrointestinal: Negative for abdominal pain, blood in stool, constipation and diarrhea.  Endocrine: Negative for increased urination.  Genitourinary: Negative for painful urination.  Musculoskeletal: Positive for arthralgias, joint pain, joint swelling and morning stiffness. Negative for myalgias, muscle weakness, muscle tenderness and myalgias.  Skin: Negative for color change, rash and redness.  Allergic/Immunologic: Negative for susceptible to infections.  Neurological: Positive for  numbness. Negative for dizziness, headaches, memory loss and weakness.  Hematological: Negative for swollen glands.  Psychiatric/Behavioral: Negative for confusion and sleep disturbance.     Previous HPI Jonathan Garner. is a 75 y.o. male here for follow up of chronic, polyarticular, tophaceous gout. He is taking allopurinol and after visit with Dr. Lorin Mercy increased his dose to 300mg  total daily. He stopped taking any colchicine. He denies new gout flare but is having persistent pain, swelling, and stiffness affecting his right 2nd and 3rd digits. Ankle swelling is partially better, still has some worse on the right. His elbow continues to drain some fluid but he thinks less than before.  PMFS History:  Patient Active Problem List   Diagnosis Date Noted   Spinal stenosis of lumbar region 04/21/2020   Pain in right foot 04/21/2020   Chronic low back pain 04/04/2020   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   Elbow wound, left, sequela 02/20/2020   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   Cigarette smoker 11/20/2019   OSA on CPAP 11/20/2019   Morbid obesity due to excess calories ([place]) 11/20/2019   DOE (dyspnea on exertion) 11/19/2019   Paresthesia 03/26/2019   Chronic diastolic heart failure ([place]) 03/09/2018   COPD GOLD 0 / still smoking  03/09/2018   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   Hypertension     Past Medical History:  Diagnosis Date   AAA (abdominal aortic aneurysm) (HCC)    CAD (coronary artery disease)    CHF (congestive heart failure) ([place])  Diverticulosis    Dyspnea    W/ EXERTION    Gout    Gynecomastia    Hx of emphysema    Hyperlipidemia    Hypertension    Lumbar spinal stenosis    OSA (obstructive sleep apnea)    Pedal edema     Family History  Problem Relation Age of Onset   Alzheimer's disease Mother    Heart attack Father    Leukemia Father    Hypertension Father    Gout Maternal Uncle     Hypertension Son    Past Surgical History:  Procedure Laterality Date   ABDOMINAL AORTIC ANEURYSM REPAIR  2013 and 2018   BACK SURGERY     CARDIAC CATHETERIZATION  04/2016   [place]   ELBOW ARTHROSCOPY Left 2011   NECK SURGERY     THORACIC AORTIC ENDOVASCULAR STENT GRAFT N/A 10/26/2017   Procedure: THORACIC AORTIC ENDOVASCULAR STENT GRAFT WITH ILIAC EXTENSIONS;  Surgeon: Rosetta Posner, MD;  Location: [facility];  Service: Vascular;  Laterality: N/A;  PATIENT WILL NEED SPINAL CORD DRAIN BY ANESTHESIA   VASECTOMY  1976   Social History   Social History Narrative   Not on file   Immunization History  Administered Date(s) Administered   Fluad Quad(high Dose 65+) 01/08/2020   Influenza, High Dose Seasonal PF 02/13/2018   PFIZER(Purple Top)SARS-COV-2 Vaccination 06/12/2019, 07/05/2019     Objective: Vital Signs: BP 125/64 (BP Location: Right Arm, Patient Position: Sitting, Cuff Size: Normal)    Pulse 96    Ht 5\' 6"  (1.676 m)    Wt 250 lb 6.4 oz (113.6 kg)    BMI 40.42 kg/m    Physical Exam Constitutional:      Appearance: He is obese.  Eyes:     Conjunctiva/sclera: Conjunctivae normal.  Skin:    General: Skin is warm and dry.     Findings: No rash.  Neurological:     General: No focal deficit present.     Mental Status: He is alert.  Psychiatric:        Mood and Affect: Mood normal.     Musculoskeletal Exam:  Elbows full ROM no tenderness, right olecranon bursa nodule, left with open incision and bandage c/d/i Wrists right is tender and stiff with pain to flexion and extension without palpable swelling, let normal Fingers right 2-3rd PIP joints with small tophi present and very stiff to flex, other joints ROM intact   Investigation: No additional findings.  Imaging: No results found.  Recent Labs: Lab Results  Component Value Date   WBC 7.9 03/21/2020   HGB 13.6 03/21/2020   PLT 269 03/21/2020   NA 142 03/21/2020   K 4.5 03/21/2020   CL 106 03/21/2020    CO2 29 03/21/2020   GLUCOSE 90 03/21/2020   BUN 24 03/21/2020   CREATININE 0.98 03/21/2020   BILITOT 0.6 01/08/2020   ALKPHOS 107 01/08/2020   AST 17 01/08/2020   ALT 17 01/08/2020   PROT 7.5 01/08/2020   ALBUMIN 4.1 01/08/2020   CALCIUM 9.6 03/21/2020   GFRAA >60 11/19/2019    Speciality Comments: No specialty comments available.  Procedures:  No procedures performed Allergies: Patient has no known allergies.   Assessment / Plan:     Visit Diagnoses: Chronic tophaceous gout - Plan: allopurinol (ZYLOPRIM) 300 MG tablet, colchicine 0.6 MG tablet  Recent labs reviewed were okay 4 weeks ago and he has not consistently increased allopurinol dose daily. No evidence of rash or other  intolerance so will recommend taking it as 1.5 tablets daily for simplification and low risk for severe reaction with incremental change. Recommend taking colchicine 0.6 mg daily for flare ppx while increasing dose. We will f/u in about 4 wks to recheck.  Pain in right foot  Discussed his right foot pain and numbness does not sound consistent with gout flare more like radicular symptom with his back pain. He has upcoming MRI to look at this and following up with Dr. Lorin Mercy.  Orders: No orders of the defined types were placed in this encounter.  Meds ordered this encounter  Medications   allopurinol (ZYLOPRIM) 300 MG tablet    Sig: Take 1.5 tablets (450 mg total) by mouth daily.    Dispense:  45 tablet    Refill:  3   colchicine 0.6 MG tablet    Sig: Take 1 tablet (0.6 mg total) by mouth daily.    Dispense:  30 tablet    Refill:  0     Follow-Up Instructions: Return in about 4 weeks (around 05/19/2020) for Gout f/u.   [name], MD  Note - This record has been created using [employer].  Chart creation errors have been sought, but may not always  have been located. Such creation errors do not reflect on  the standard of medical care.

## 2020-04-21 ENCOUNTER — Other Ambulatory Visit: Payer: Self-pay

## 2020-04-21 ENCOUNTER — Encounter: Payer: Self-pay | Admitting: Internal Medicine

## 2020-04-21 ENCOUNTER — Ambulatory Visit: Payer: Medicare HMO | Admitting: Internal Medicine

## 2020-04-21 VITALS — BP 125/64 | HR 96 | Ht 66.0 in | Wt 250.4 lb

## 2020-04-21 DIAGNOSIS — M1A9XX1 Chronic gout, unspecified, with tophus (tophi): Secondary | ICD-10-CM | POA: Diagnosis not present

## 2020-04-21 DIAGNOSIS — M48061 Spinal stenosis, lumbar region without neurogenic claudication: Secondary | ICD-10-CM | POA: Insufficient documentation

## 2020-04-21 DIAGNOSIS — M79671 Pain in right foot: Secondary | ICD-10-CM | POA: Diagnosis not present

## 2020-04-21 MED ORDER — COLCHICINE 0.6 MG PO TABS: 0.6000 mg | ORAL_TABLET | Freq: Every day | ORAL | 0 refills | Status: DC

## 2020-04-21 MED ORDER — ALLOPURINOL 300 MG PO TABS: 450.0000 mg | ORAL_TABLET | Freq: Every day | ORAL | 3 refills | Status: DC

## 2020-04-22 DIAGNOSIS — G8929 Other chronic pain: Secondary | ICD-10-CM | POA: Diagnosis not present

## 2020-04-22 DIAGNOSIS — M48061 Spinal stenosis, lumbar region without neurogenic claudication: Secondary | ICD-10-CM | POA: Diagnosis not present

## 2020-04-22 DIAGNOSIS — M47816 Spondylosis without myelopathy or radiculopathy, lumbar region: Secondary | ICD-10-CM | POA: Diagnosis not present

## 2020-04-22 DIAGNOSIS — M4807 Spinal stenosis, lumbosacral region: Secondary | ICD-10-CM | POA: Diagnosis not present

## 2020-04-22 DIAGNOSIS — M545 Low back pain, unspecified: Secondary | ICD-10-CM | POA: Diagnosis not present

## 2020-04-24 ENCOUNTER — Ambulatory Visit (INDEPENDENT_AMBULATORY_CARE_PROVIDER_SITE_OTHER): Payer: Medicare HMO | Admitting: Orthopaedic Surgery

## 2020-04-24 ENCOUNTER — Other Ambulatory Visit: Payer: Self-pay

## 2020-04-24 DIAGNOSIS — M545 Low back pain, unspecified: Secondary | ICD-10-CM

## 2020-04-24 DIAGNOSIS — G8929 Other chronic pain: Secondary | ICD-10-CM | POA: Diagnosis not present

## 2020-04-24 NOTE — Progress Notes (Signed)
Office Visit Note   Patient: Jonathan Garner.           Date of Birth: Aug 26, 1945           MRN: [id number] Visit Date: 04/24/2020              Requested by: Jonathan Rota, NP [address],  [place] [address] PCP: Jonathan Rota, NP   Assessment & Plan: Visit Diagnoses:  1. Chronic bilateral low back pain without sciatica     Plan: Patient needs to go to the [employer] with his wife Jonathan Garner attends and get in the pool where he can work on swimming 5 to 6 days a week.  He needs to drop 40 to 50 pounds and quit smoking.  We reviewed the MRI scan he does not have any severe spinal stenosis no severe foraminal stenosis.  He does have some mild foraminal narrowing but no instability and no indications for surgery.  We discussed lifestyle changes he needs to make.  He can follow-up on an as-needed basis.  Follow-Up Instructions: Return if symptoms worsen or fail to improve.   Orders:  No orders of the defined types were placed in this encounter.  No orders of the defined types were placed in this encounter.     Procedures: No procedures performed   Clinical Data: No additional findings.   Subjective: Chief Complaint  Patient presents with  . Other     Scan review     HPI 75 year old male returns with ongoing problems with back pain that radiates into his legs.  He has gout, COPD, previous aneurysm dissection surgery and is still smoking. Patient still on allopurinol for his gout.  When he walks he has some pain in his legs.  Previous electrical test showed no evidence of radiculopathy lumbar but changes with polyneuropathy lower extremities.  MRI scan is been obtained to the lumbar spine is available for review with patient today. Review of Systems All other systems noncontributory to HPI.  He has had problems with diastolic heart failure, hypertension, morbid obesity due to excess calories still smoking 5 cigarettes a day and COPD.  Objective: Vital Signs: There were no  vitals taken for this visit.  Physical Exam Constitutional:      Appearance: He is well-developed.  HENT:     Head: Normocephalic and atraumatic.  Eyes:     Pupils: Pupils are equal, round, and reactive to light.  Neck:     Thyroid: No thyromegaly.     Trachea: No tracheal deviation.  Cardiovascular:     Rate and Rhythm: Normal rate.  Pulmonary:     Effort: Pulmonary effort is normal.     Breath sounds: No wheezing.  Abdominal:     General: Bowel sounds are normal.     Palpations: Abdomen is soft.  Skin:    General: Skin is warm and dry.     Capillary Refill: Capillary refill takes less than 2 seconds.  Neurological:     Mental Status: He is alert and oriented to person, place, and time.  Psychiatric:        Behavior: Behavior normal.        Thought Content: Thought content normal.        Judgment: Judgment normal.     Ortho Exam patient is able to ambulate negative straight leg raising 90 degrees negative logroll the hips.  Cyst at the thoracolumbar junction.  Specialty Comments:  No specialty comments available.  Imaging: Impression  Performed by [facility] RAD Multilevel spondylosis. No significant spinal canal narrowing.   Moderate L4-5 and mild L2-4, L5-S1 bilateral neural foraminal  narrowing.    Electronically Signed  By: Jonathan Garner M.D.  On: 04/22/2020 11:09  Narrative Performed by [facility] RAD CLINICAL DATA: LBP and left side LBP at waist line with some leg  weakness.   EXAM:  MRI LUMBAR SPINE WITHOUT CONTRAST   TECHNIQUE:  Multiplanar, multisequence MR imaging of the lumbar spine was  performed. No intravenous contrast was administered.   COMPARISON: 10/08/2019 and prior.   FINDINGS:  Please note abdominal susceptibility artifact limits evaluation.   Segmentation: Standard.   Alignment: Trace L1-2 retrolisthesis.   Vertebrae: Normal bone marrow signal intensity. No fracture or  aggressive osseous lesion.   Conus medullaris and  cauda equina: Conus extends to the L1 level.  Conus and cauda equina appear normal.   Disc levels: Multilevel desiccation.   L1-2: Minimal disc bulge and bilateral facet hypertrophy. Patent  spinal can[place] neural foramen.   L2-3: Right predominant disc bulge and bilateral facet hypertrophy.  Patent spinal canal. Mild bilateral neural foraminal narrowing.   L3-4: Mild disc bulge and bilateral facet hypertrophy. Patent spinal  canal. Mild bilateral neural foraminal narrowing.   L4-5: Disc bulge and bilateral facet hypertrophy. Superimposed small  central and right foraminal protrusions. Sequela of right  laminotomy. Patent spinal canal. Moderate bilateral neural foraminal  narrowing.   L5-S1: Rightpredominant disc bulge with central annular fissuring.  Bilateral facet hypertrophy. Patent spinal canal. Mild right greater  than left neural foraminal narrowing.   Paraspinal and other soft tissues: Postsurgical appearance of the  paraspinal soft tissues. Posterior body wall sebaceous cyst.  Procedure Note  Jonathan Garner A, MD - 04/22/2020  Formatting of this note might be different from the original.  CLINICAL DATA: LBP and left side LBP at waist line with some leg  weakness.   EXAM:     PMFS History: Patient Active Problem List   Diagnosis Date Noted  . Spinal stenosis of lumbar region 04/21/2020  . Pain in right foot 04/21/2020  . Chronic low back pain 04/04/2020  . 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 Elbow wound, left, sequela 02/20/2020  . 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  . Cigarette smoker 11/20/2019  . OSA on CPAP 11/20/2019  . Morbid obesity due to excess calories ([place]) 11/20/2019  . DOE (dyspnea on exertion) 11/19/2019  . Paresthesia 03/26/2019  . Chronic diastolic heart failure ([place]) 03/09/2018  . COPD GOLD 0 / still smoking  03/09/2018  . 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 Hypertension    Past Medical History:   Diagnosis Date  . AAA (abdominal aortic aneurysm) ([place])   . CAD (coronary artery disease)   . CHF (congestive heart failure) ([place])   . Diverticulosis   . Dyspnea    W/ EXERTION   . Gout   . Gynecomastia   . Hx of emphysema   . Hyperlipidemia   . Hypertension   . Lumbar spinal stenosis   . OSA (obstructive sleep apnea)   . Pedal edema     Family History  Problem Relation Age of Onset  . Alzheimer's disease Mother   . Heart attack Father   . Leukemia Father   . Hypertension Father   . Gout Maternal Uncle   . Hypertension Son     Past Surgical History:  Procedure Laterality Date  . ABDOMINAL AORTIC ANEURYSM REPAIR  2013 and 2018  .  BACK SURGERY    . CARDIAC CATHETERIZATION  04/2016   [place], [place]  . ELBOW ARTHROSCOPY Left 2011  . NECK SURGERY    . THORACIC AORTIC ENDOVASCULAR STENT GRAFT N/A 10/26/2017   Procedure: THORACIC AORTIC ENDOVASCULAR STENT GRAFT WITH ILIAC EXTENSIONS;  Surgeon: Rosetta Posner, MD;  Location: [facility];  Service: Vascular;  Laterality: N/A;  PATIENT WILL NEED SPINAL CORD DRAIN BY ANESTHESIA  . VASECTOMY  1976   Social History   Occupational History  . Not on file  Tobacco Use  . Smoking status: Current Every Day Smoker    Packs/day: 0.25    Years: 58.00    Pack years: 14.50    Types: Cigars, Cigarettes    Start date: 11/15/1961  . Smokeless tobacco: Never Used  Vaping Use  . Vaping Use: Former  Substance and Sexual Activity  . Alcohol use: Yes    Comment: occasionally  . Drug use: Never  . Sexual activity: Not on file

## 2020-05-08 DIAGNOSIS — G4733 Obstructive sleep apnea (adult) (pediatric): Secondary | ICD-10-CM | POA: Diagnosis not present

## 2020-05-19 ENCOUNTER — Ambulatory Visit: Payer: Medicare HMO | Admitting: Internal Medicine

## 2020-05-19 ENCOUNTER — Other Ambulatory Visit: Payer: Self-pay

## 2020-05-19 ENCOUNTER — Encounter: Payer: Self-pay | Admitting: Internal Medicine

## 2020-05-19 VITALS — BP 107/64 | HR 88 | Resp 17 | Ht 66.0 in | Wt 253.6 lb

## 2020-05-19 DIAGNOSIS — M1A9XX1 Chronic gout, unspecified, with tophus (tophi): Secondary | ICD-10-CM

## 2020-05-19 DIAGNOSIS — S51002S Unspecified open wound of left elbow, sequela: Secondary | ICD-10-CM

## 2020-05-19 MED ORDER — SULFAMETHOXAZOLE-TRIMETHOPRIM 800-160 MG PO TABS: 1.0000 | ORAL_TABLET | Freq: Two times a day (BID) | ORAL | 0 refills | Status: DC

## 2020-05-19 NOTE — Progress Notes (Signed)
Office Visit Note  Patient: Jonathan Garner.             Date of Birth: Aug 30, 1945           MRN: [id number]             PCP: Carolee Rota, NP Referring: Carolee Rota, NP Visit Date: 05/19/2020   Subjective:  Other (Bilateral foot pain, right hand pain/swelling )   History of Present Illness: Jonathan Garner. is a 75 y.o. male here for follow up of gout after increasing allopurinol to 450 mg PO daily and taking colchicine 0.6 mg daily. He notices continued pain in the right hand joints like before but is having some new pain in the left elbow and notices the chronic drainage from this is more yellow than white now. He denies fevers, chills, skin rashes associated with this. Otherwise no major events.   Review of Systems  Constitutional: Negative for fatigue.  HENT: Negative for mouth sores, mouth dryness and nose dryness.   Eyes: Negative for pain, itching and dryness.  Respiratory: Positive for shortness of breath. Negative for difficulty breathing.   Cardiovascular: Negative for chest pain and palpitations.  Gastrointestinal: Negative for blood in stool, constipation and diarrhea.  Endocrine: Negative for increased urination.  Genitourinary: Negative for difficulty urinating.  Musculoskeletal: Positive for arthralgias, joint pain, joint swelling, myalgias, morning stiffness, muscle tenderness and myalgias.  Skin: Negative for color change, rash and redness.  Allergic/Immunologic: Negative for susceptible to infections.  Neurological: Positive for numbness. Negative for dizziness, headaches, memory loss and weakness.  Hematological: Positive for bruising/bleeding tendency.  Psychiatric/Behavioral: Negative for confusion.    PMFS History:  Patient Active Problem List   Diagnosis Date Noted  . Spinal stenosis of lumbar region 04/21/2020  . Pain in right foot 04/21/2020  . Chronic low back pain 04/04/2020  . 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 Elbow wound, left,  sequela 02/20/2020  . 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  . Cigarette smoker 11/20/2019  . OSA on CPAP 11/20/2019  . Morbid obesity due to excess calories ([place]) 11/20/2019  . DOE (dyspnea on exertion) 11/19/2019  . Paresthesia 03/26/2019  . Chronic diastolic heart failure ([place]) 03/09/2018  . COPD GOLD 0 / still smoking  03/09/2018  . 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 Hypertension     Past Medical History:  Diagnosis Date  . AAA (abdominal aortic aneurysm) ([place])   . CAD (coronary artery disease)   . CHF (congestive heart failure) ([place])   . Diverticulosis   . Dyspnea    W/ EXERTION   . Gout   . Gynecomastia   . Hx of emphysema   . Hyperlipidemia   . Hypertension   . Lumbar spinal stenosis   . OSA (obstructive sleep apnea)   . Pedal edema     Family History  Problem Relation Age of Onset  . Alzheimer's disease Mother   . Heart attack Father   . Leukemia Father   . Hypertension Father   . Gout Maternal Uncle   . Hypertension Son    Past Surgical History:  Procedure Laterality Date  . ABDOMINAL AORTIC ANEURYSM REPAIR  2013 and 2018  . BACK SURGERY    . CARDIAC CATHETERIZATION  04/2016   [place]  . ELBOW ARTHROSCOPY Left 2011  . NECK SURGERY    . THORACIC AORTIC ENDOVASCULAR STENT GRAFT N/A 10/26/2017   Procedure: THORACIC AORTIC ENDOVASCULAR STENT GRAFT WITH ILIAC EXTENSIONS;  Surgeon: Rosetta Posner, MD;  Location: [facility] OR;  Service: Vascular;  Laterality: N/A;  PATIENT WILL NEED SPINAL CORD DRAIN BY ANESTHESIA  . [place] History   Social History Narrative  . Not on file   Immunization History  Administered Date(s) Administered  . Fluad Quad(high Dose 65+) 01/08/2020  . Influenza, High Dose Seasonal PF 02/13/2018  . PFIZER(Purple Top)SARS-COV-2 Vaccination 06/12/2019, 07/05/2019     Objective: Vital Signs: BP 107/64 (BP Location: Left Arm, Patient Position: Sitting, Cuff Size: Normal)   Pulse 88   Resp 17    Ht 5\' 6"  (1.676 m)   Wt 253 lb 9.6 oz (115 kg)   BMI 40.93 kg/m    Physical Exam Constitutional:      Appearance: He is obese.  HENT:     Right Ear: External ear normal.     Left Ear: External ear normal.  Eyes:     Conjunctiva/sclera: Conjunctivae normal.  Skin:    General: Skin is warm and dry.     Findings: No rash.  Neurological:     Mental Status: He is alert.  Psychiatric:        Mood and Affect: Mood normal.     Musculoskeletal Exam:  Elbows left side swelling olecranon bursa small opening with faintly yellow drainage expressed with pressure tenderness Fingers right 2nd and 3rd stiffness, swelling, dorsal small tophi present  Investigation: No additional findings.  Imaging: No results found.  Recent Labs: Lab Results  Component Value Date   WBC 6.3 05/19/2020   HGB 13.0 (L) 05/19/2020   PLT 223 05/19/2020   NA 142 05/19/2020   K 4.1 05/19/2020   CL 107 05/19/2020   CO2 26 05/19/2020   GLUCOSE 92 05/19/2020   BUN 25 05/19/2020   CREATININE 1.01 05/19/2020   BILITOT 0.3 05/19/2020   ALKPHOS 107 01/08/2020   AST 15 05/19/2020   ALT 16 05/19/2020   PROT 6.1 05/19/2020   ALBUMIN 4.1 01/08/2020   CALCIUM 8.9 05/19/2020   GFRAA 84 05/19/2020    Speciality Comments: No specialty comments available.  Procedures:  No procedures performed Allergies: Patient has no known allergies.   Assessment / Plan:     Visit Diagnoses: Chronic tophaceous gout - Plan: CBC with Differential/Platelet, COMPLETE METABOLIC PANEL WITH GFR, Uric acid  Still having joint pain in multiple areas worst is the right hand and the left elbow. Rechecking uric acid now that allopurinol was increased to 450 mg daily. Rechecking CBC and CMP at increased dose as well. Also recommend continuing low dose colchicine for ongoing inflammation till at goal for 3 mos.  Elbow wound, left, sequela - Plan: CBC with Differential/Platelet, sulfamethoxazole-trimethoprim (BACTRIM DS) 800-160 MG  tablet  Discoloration of drainage from elbow may reflect purulent soft tissue infection, it is progressing more towards closed than before. Recommend course of oral Bactrim for 1 week. Will consult with orthopedic surgeon whether procedure might improve this now his uric acid has lowered down to goal on allopurinol treatment.  Orders: Orders Placed This Encounter  Procedures  . CBC with Differential/Platelet  . COMPLETE METABOLIC PANEL WITH GFR  . Uric acid   Meds ordered this encounter  Medications  . sulfamethoxazole-trimethoprim (BACTRIM DS) 800-160 MG tablet    Sig: Take 1 tablet by mouth 2 (two) times daily.    Dispense:  14 tablet    Refill:  0  . colchicine 0.6 MG tablet    Sig: Take 1 tablet (0.6  mg total) by mouth daily.    Dispense:  30 tablet    Refill:  1     Follow-Up Instructions: No follow-ups on file.   [name], MD  Note - This record has been created using [employer].  Chart creation errors have been sought, but may not always  have been located. Such creation errors do not reflect on  the standard of medical care.

## 2020-05-19 NOTE — Patient Instructions (Addendum)
I am checking the uric acid level again today to see if it has improved to the goal under 6 on the 1.5 tablets of allopurinol.  I prescribed an antibiotic Bactrim take 1 tablet 2 times daily for 1 week. This should treat for most types of bacteria that may be causing pus and drainage at the elbow. If symptoms do not get better with this treatment let us know, it could require a procedure to wash out the elbow.  We will follow up based on whether the medication needs to be changed again and how that elbow is doing.

## 2020-05-20 LAB — COMPLETE METABOLIC PANEL WITH GFR
AG Ratio: 1.8 (calc) (ref 1.0–2.5)
ALT: 16 U/L (ref 9–46)
AST: 15 U/L (ref 10–35)
Albumin: 3.9 g/dL (ref 3.6–5.1)
Alkaline phosphatase (APISO): 93 U/L (ref 35–144)
BUN: 25 mg/dL (ref 7–25)
CO2: 26 mmol/L (ref 20–32)
Calcium: 8.9 mg/dL (ref 8.6–10.3)
Chloride: 107 mmol/L (ref 98–110)
Creat: 1.01 mg/dL (ref 0.70–1.18)
GFR, Est African American: 84 mL/min/{1.73_m2} (ref >=60)
GFR, Est Non African American: 72 mL/min/{1.73_m2} (ref >=60)
Globulin: 2.2 g/dL (calc) (ref 1.9–3.7)
Glucose, Bld: 92 mg/dL (ref 65–99)
Potassium: 4.1 mmol/L (ref 3.5–5.3)
Sodium: 142 mmol/L (ref 135–146)
Total Bilirubin: 0.3 mg/dL (ref 0.2–1.2)
Total Protein: 6.1 g/dL (ref 6.1–8.1)

## 2020-05-20 LAB — CBC WITH DIFFERENTIAL/PLATELET
Absolute Monocytes: 655 cells/uL (ref 200–950)
Basophils Absolute: 32 cells/uL (ref 0–200)
Basophils Relative: 0.5 %
Eosinophils Absolute: 208 cells/uL (ref 15–500)
Eosinophils Relative: 3.3 %
HCT: 38.6 % (ref 38.5–50.0)
Hemoglobin: 13 g/dL — ABNORMAL LOW (ref 13.2–17.1)
Lymphs Abs: 1814 cells/uL (ref 850–3900)
MCH: 30.2 pg (ref 27.0–33.0)
MCHC: 33.7 g/dL (ref 32.0–36.0)
MCV: 89.8 fL (ref 80.0–100.0)
MPV: 10.8 fL (ref 7.5–12.5)
Monocytes Relative: 10.4 %
Neutro Abs: 3591 cells/uL (ref 1500–7800)
Neutrophils Relative %: 57 %
Platelets: 223 10*3/uL (ref 140–400)
RBC: 4.3 10*6/uL (ref 4.20–5.80)
RDW: 15 % (ref 11.0–15.0)
Total Lymphocyte: 28.8 %
WBC: 6.3 10*3/uL (ref 3.8–10.8)

## 2020-05-20 LAB — URIC ACID: Uric Acid, Serum: 5.5 mg/dL (ref 4.0–8.0)

## 2020-05-20 NOTE — Progress Notes (Signed)
Uric acid is now at 5.5 which is less than the goal of 6.0. He does not need to change allopurinol dose just stay at 1.5 tablets daily. Usually within about 3 months of getting to goal we should see improvement of the gout activity. The white blood cell count is normal so I do not think there is a whole body infection, but could still have a local problem at the elbow with that yellow drainage. I will ask Dr. Lorin Mercy about if they can do more about the elbow now that the gout is being treated.

## 2020-05-22 MED ORDER — COLCHICINE 0.6 MG PO TABS: 0.6000 mg | ORAL_TABLET | Freq: Every day | ORAL | 1 refills | Status: DC

## 2020-05-29 ENCOUNTER — Telehealth: Payer: Self-pay

## 2020-05-29 NOTE — Telephone Encounter (Signed)
Uric Acid 05/19/2020: 5.5 Result note: He does not need to change allopurinol dose just stay at 1.5 tablets daily.  Spoke with patient, advised he should continue taking 1.5 tablets (450 mg) daily. Advised Allpurinol Rx sent on 04/21/2020 was sent with 3 refills and he should contact [facility].

## 2020-05-29 NOTE — Telephone Encounter (Signed)
Patient called stating he took his last tablet of Allopurinol this morning and requested a return call to let him know if he is suppose to refill the medication.

## 2020-06-08 DIAGNOSIS — G4733 Obstructive sleep apnea (adult) (pediatric): Secondary | ICD-10-CM | POA: Diagnosis not present

## 2020-06-20 DIAGNOSIS — M549 Dorsalgia, unspecified: Secondary | ICD-10-CM | POA: Diagnosis not present

## 2020-06-20 DIAGNOSIS — M5430 Sciatica, unspecified side: Secondary | ICD-10-CM | POA: Diagnosis not present

## 2020-06-27 DIAGNOSIS — M5441 Lumbago with sciatica, right side: Secondary | ICD-10-CM | POA: Diagnosis not present

## 2020-07-07 DIAGNOSIS — L814 Other melanin hyperpigmentation: Secondary | ICD-10-CM | POA: Diagnosis not present

## 2020-07-07 DIAGNOSIS — D225 Melanocytic nevi of trunk: Secondary | ICD-10-CM | POA: Diagnosis not present

## 2020-07-07 DIAGNOSIS — L720 Epidermal cyst: Secondary | ICD-10-CM | POA: Diagnosis not present

## 2020-07-12 ENCOUNTER — Encounter (HOSPITAL_COMMUNITY): Payer: Self-pay

## 2020-07-12 ENCOUNTER — Emergency Department (HOSPITAL_COMMUNITY): Payer: Medicare HMO

## 2020-07-12 ENCOUNTER — Other Ambulatory Visit: Payer: Self-pay

## 2020-07-12 ENCOUNTER — Emergency Department (HOSPITAL_COMMUNITY)
Admission: EM | Admit: 2020-07-12 | Discharge: 2020-07-12 | Disposition: A | Discharge status: Home / Self Care | Payer: Medicare HMO | Attending: Emergency Medicine | Admitting: Emergency Medicine

## 2020-07-12 DIAGNOSIS — F17210 Nicotine dependence, cigarettes, uncomplicated: Secondary | ICD-10-CM | POA: Diagnosis not present

## 2020-07-12 DIAGNOSIS — Z7982 Long term (current) use of aspirin: Secondary | ICD-10-CM | POA: Insufficient documentation

## 2020-07-12 DIAGNOSIS — M533 Sacrococcygeal disorders, not elsewhere classified: Secondary | ICD-10-CM | POA: Diagnosis not present

## 2020-07-12 DIAGNOSIS — Z79899 Other long term (current) drug therapy: Secondary | ICD-10-CM | POA: Diagnosis not present

## 2020-07-12 DIAGNOSIS — I5032 Chronic diastolic (congestive) heart failure: Secondary | ICD-10-CM | POA: Diagnosis not present

## 2020-07-12 DIAGNOSIS — I110 Hypertensive heart disease with heart failure: Secondary | ICD-10-CM | POA: Diagnosis not present

## 2020-07-12 DIAGNOSIS — M7989 Other specified soft tissue disorders: Secondary | ICD-10-CM | POA: Diagnosis not present

## 2020-07-12 DIAGNOSIS — I2510 Atherosclerotic heart disease of native coronary artery without angina pectoris: Secondary | ICD-10-CM | POA: Insufficient documentation

## 2020-07-12 DIAGNOSIS — M25561 Pain in right knee: Secondary | ICD-10-CM | POA: Diagnosis not present

## 2020-07-12 DIAGNOSIS — R69 Illness, unspecified: Secondary | ICD-10-CM | POA: Diagnosis not present

## 2020-07-12 DIAGNOSIS — R609 Edema, unspecified: Secondary | ICD-10-CM | POA: Diagnosis not present

## 2020-07-12 DIAGNOSIS — M545 Low back pain, unspecified: Secondary | ICD-10-CM | POA: Diagnosis not present

## 2020-07-12 DIAGNOSIS — M25569 Pain in unspecified knee: Secondary | ICD-10-CM

## 2020-07-12 DIAGNOSIS — M11261 Other chondrocalcinosis, right knee: Secondary | ICD-10-CM | POA: Diagnosis not present

## 2020-07-12 DIAGNOSIS — J449 Chronic obstructive pulmonary disease, unspecified: Secondary | ICD-10-CM | POA: Diagnosis not present

## 2020-07-12 MED ORDER — PREDNISONE 20 MG PO TABS: 40.0000 mg | ORAL_TABLET | Freq: Every day | ORAL | 0 refills | Status: DC

## 2020-07-12 MED ORDER — COLCHICINE 0.6 MG PO TABS: 0.6000 mg | ORAL_TABLET | Freq: Two times a day (BID) | ORAL | 0 refills | Status: DC

## 2020-07-12 MED ORDER — APIXABAN 5 MG PO TABS: 5.0000 mg | ORAL_TABLET | Freq: Two times a day (BID) | ORAL | Status: DC

## 2020-07-12 MED ORDER — APIXABAN 5 MG PO TABS
10.0000 mg | ORAL_TABLET | ORAL | Status: AC
  Administered 2020-07-12: 10 mg via ORAL
  Filled 2020-07-12: qty 2

## 2020-07-12 NOTE — ED Triage Notes (Signed)
Pt to er, pt states that a couple of weeks ago he had leg pain and went to his pmd, states that he was giving two shots and some pills, states that this didn't help and last week it was getting worse, states that now he is having pain in the back of his R knee, states that something sharp is stuck in his knee.

## 2020-07-12 NOTE — Discharge Instructions (Addendum)
Your x-ray looks good, you do have some arthritis but I suspect that you may have either gout in your knee or some inflammation.  I have ordered an ultrasound to be done tomorrow, come back to the emergency department and tell them you are here for your "outpatient ultrasound".  You will not need to be registered as an emergency department patient.  Please see the instructions on the paperwork.  Take prednisone daily for 5 days, colchicine as prescribed, ER for severe or worsening symptoms including fevers or severe leg swelling

## 2020-07-12 NOTE — ED Provider Notes (Signed)
[facility] Provider Note   CSN: [id number] Arrival date & time: 07/12/20  1352     History Chief Complaint  Patient presents with  . Leg Pain  . Back Pain    Jonathan Garner. is a 75 y.o. male.  HPI   This patient is a 75 year old male, he has multiple medical problems including hypertension, hyperlipidemia, emphysema, gout and a history of congestive heart failure.  The patient states that he has started to have some pain in his right buttock a couple of weeks ago, was seen by his family doctor who placed him on steroids after doing injections in both of his hips, none of that helped and he continues to have pain though the pain is now more focused around his right knee.  He feels like the knee is slightly swollen, there is a very focal pain in the right anteromedial aspect of the knee just inferior to the patella, it is much worse with walking or trying to range of motion the right knee.  He has no fevers or chills, no nausea or vomiting, he has been taking the medications he was prescribed including anti-inflammatories which have not seem to give him any relief.  No history of DVT or PE  Past Medical History:  Diagnosis Date  . AAA (abdominal aortic aneurysm) ([name])   . CAD (coronary artery disease)   . CHF (congestive heart failure) ([name])   . Diverticulosis   . Dyspnea    W/ EXERTION   . Gout   . Gynecomastia   . Hx of emphysema   . Hyperlipidemia   . Hypertension   . Lumbar spinal stenosis   . OSA (obstructive sleep apnea)   . Pedal edema     Patient Active Problem List   Diagnosis Date Noted  . Spinal stenosis of lumbar region 04/21/2020  . Pain in right foot 04/21/2020  . Chronic low back pain 04/04/2020  . 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 Elbow wound, left, sequela 02/20/2020  . 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  . Cigarette smoker 11/20/2019  . OSA on CPAP 11/20/2019  . Morbid obesity due to excess calories ([name])  11/20/2019  . DOE (dyspnea on exertion) 11/19/2019  . Paresthesia 03/26/2019  . Chronic diastolic heart failure ([name]) 03/09/2018  . COPD GOLD 0 / still smoking  03/09/2018  . 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 Hypertension     Past Surgical History:  Procedure Laterality Date  . ABDOMINAL AORTIC ANEURYSM REPAIR  2013 and 2018  . BACK SURGERY    . CARDIAC CATHETERIZATION  04/2016   [place], [place]  . ELBOW ARTHROSCOPY Left 2011  . NECK SURGERY    . THORACIC AORTIC ENDOVASCULAR STENT GRAFT N/A 10/26/2017   Procedure: THORACIC AORTIC ENDOVASCULAR STENT GRAFT WITH ILIAC EXTENSIONS;  Surgeon: Rosetta Posner, MD;  Location: [facility] OR;  Service: Vascular;  Laterality: N/A;  PATIENT WILL NEED SPINAL CORD DRAIN BY ANESTHESIA  . VASECTOMY  1976       Family History  Problem Relation Age of Onset  . Alzheimer's disease Mother   . Heart attack Father   . Leukemia Father   . Hypertension Father   . Gout Maternal Uncle   . Hypertension Son     Social History   Tobacco Use  . Smoking status: Current Some Day Smoker    Packs/day: 0.25    Years: 58.00    Pack years: 14.50    Types:  Cigars, Cigarettes    Start date: 11/15/1961  . Smokeless tobacco: Never Used  Vaping Use  . Vaping Use: Never used  Substance Use Topics  . Alcohol use: Yes    Comment: occasionally  . Drug use: Never    Home Medications Prior to Admission medications   Medication Sig Start Date End Date Taking? Authorizing Provider  colchicine 0.6 MG tablet Take 1 tablet (0.6 mg total) by mouth 2 (two) times daily. 07/12/20  Yes Noemi Chapel, MD  predniSONE (DELTASONE) 20 MG tablet Take 2 tablets (40 mg total) by mouth daily. 07/12/20  Yes Noemi Chapel, MD  albuterol (PROVENTIL HFA;VENTOLIN HFA) 108 (90 Base) MCG/ACT inhaler Inhale 1-2 puffs into the lungs every 6 (six) hours as needed for wheezing or shortness of breath. 02/13/18   Herminio Commons, MD  allopurinol (ZYLOPRIM) 300 MG tablet Take 1.5  tablets (450 mg total) by mouth daily. 04/21/20   [name], MD  aspirin EC 81 MG tablet Take 81 mg by mouth daily.    [provider]  ibuprofen (ADVIL,MOTRIN) 200 MG tablet Take 800 mg by mouth every 8 (eight) hours as needed (FOR PAIN.).    [provider]  labetalol (NORMODYNE) 200 MG tablet Take 200 mg by mouth 2 (two) times daily.    [provider]  losartan (COZAAR) 50 MG tablet Take 50 mg by mouth daily.  07/31/17   [provider]  torsemide (DEMADEX) 20 MG tablet Take 40 mg by mouth daily.     [provider]    Allergies    Patient has no known allergies.  Review of Systems   Review of Systems  Constitutional: Negative for fever.  Musculoskeletal: Positive for arthralgias and joint swelling.  Skin: Negative for rash.  Neurological: Negative for weakness and numbness.    Physical Exam Updated Vital Signs BP 97/68 (BP Location: Right Arm)   Pulse 67   Temp 98.6 F (37 C) (Oral)   Resp 18   Ht 1.676 m (5\' 6" )   Wt 113.4 kg   SpO2 96%   BMI 40.35 kg/m   Physical Exam Vitals and nursing note reviewed.  Constitutional:      Appearance: He is well-developed. He is not diaphoretic.  HENT:     Head: Normocephalic and atraumatic.  Eyes:     General:        Right eye: No discharge.        Left eye: No discharge.     Conjunctiva/sclera: Conjunctivae normal.  Cardiovascular:     Rate and Rhythm: Normal rate and regular rhythm.     Pulses: Normal pulses.  Pulmonary:     Effort: Pulmonary effort is normal. No respiratory distress.  Musculoskeletal:     Right lower leg: Edema present.     Left lower leg: Edema present.     Comments: Scant bilateral lower extremity edema at the ankles.  There are some prominent veins in the right lower extremity below the knee, there is no palpable cord, there is no asymmetry, the right knee has mild effusion, there is tenderness with range of motion of the right knee, it is not warm it  is not red and there is no overlying rash  Skin:    General: Skin is warm and dry.     Findings: No erythema or rash.  Neurological:     Mental Status: He is alert.     Coordination: Coordination normal.     Comments: The  patient is able to stand and tiptoe on both feet, there does not appear to be any weakness of the hip flexors extensors knee flexors extensors or ankle dorsiflexors or plantar flexors.  There is normal sensation bilaterally     ED Results / Procedures / Treatments   Labs (all labs ordered are listed, but only abnormal results are displayed) Labs Reviewed - No data to display  EKG None  Radiology DG Knee Complete 4 Views Right  Result Date: 07/12/2020 CLINICAL DATA:  Swelling and pain. EXAM: RIGHT KNEE - COMPLETE 4+ VIEW COMPARISON:  None. FINDINGS: No evidence of fracture, dislocation, or joint effusion. Minimal joint space narrowing, predominantly medially. 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 Mild osteophytosis of the patella. Soft tissues are unremarkable. IMPRESSION: 1. No acute fracture or dislocation identified about the right knee. 2. Minimal joint space narrowing, predominantly medially. 3. 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 Electronically Signed   By: Fidela Salisbury M.D.   On: 07/12/2020 16:23    Procedures Procedures   Medications Ordered in ED Medications  apixaban (ELIQUIS) tablet 10 mg (has no administration in time range)    Followed by  apixaban (ELIQUIS) tablet 5 mg (has no administration in time range)    ED Course  I have reviewed the triage vital signs and the nursing notes.  Pertinent labs & imaging results that were available during my care of the patient were reviewed by me and considered in my medical decision making (see chart for details).    MDM Rules/Calculators/A&P                          The patient's exam is  less concerning for sciatica and more concerning for gout however given the veins that are more prominent on that leg and the feeling like the leg or knee may be swollen I think a DVT study would also be reasonable.  He has no chest pain or shortness of breath and a heart rate of 67 with an oxygen of 96% and normal lung sounds.  We will proceed with right knee x-ray, doubt septic arthritis, outpatient ultrasound study  X-rays unremarkable except for some arthritis, ultrasound will be ordered for the morning, the patient will also be treated for acute gout given his history and isolated monoarticular arthritis.  Patient is stable for discharge  Final Clinical Impression(s) / ED Diagnoses Final diagnoses:  Acute knee pain, unspecified laterality    Rx / DC Orders ED Discharge Orders         Ordered    US Venous Img Lower Unilateral Right        07/12/20 1725    predniSONE (DELTASONE) 20 MG tablet  Daily        07/12/20 1725    colchicine 0.6 MG tablet  2 times daily        07/12/20 1725           Noemi Chapel, MD 07/12/20 1726

## 2020-07-13 ENCOUNTER — Ambulatory Visit (HOSPITAL_COMMUNITY)
Admission: RE | Admit: 2020-07-13 | Discharge: 2020-07-13 | Disposition: A | Discharge status: Home / Self Care | Payer: Medicare HMO | Source: Ambulatory Visit | Attending: Emergency Medicine | Admitting: Emergency Medicine

## 2020-07-13 DIAGNOSIS — M7989 Other specified soft tissue disorders: Secondary | ICD-10-CM | POA: Diagnosis not present

## 2020-07-13 DIAGNOSIS — R600 Localized edema: Secondary | ICD-10-CM | POA: Diagnosis not present

## 2020-07-13 DIAGNOSIS — M79661 Pain in right lower leg: Secondary | ICD-10-CM | POA: Diagnosis not present

## 2020-07-13 NOTE — ED Provider Notes (Signed)
Blood pressure (!) 149/87, pulse 84, temperature 98.6 F (37 C), temperature source Oral, resp. rate 19, height 5\' 6"  (1.676 m), weight 113.4 kg, SpO2 97 %.  In short, Jonathan Garner. is a 75 y.o. male with a chief complaint of Leg Pain and Back Pain .  Refer to the original H&P for additional details.  09:50 AM  Patient returns to the emergency department after being seen last night with pain in the leg and back.  Ultrasound was not available and so patient returned today for DVT ultrasound.  The study was performed and interpreted as no DVT by the radiologist.   IMPRESSION: No evidence of DVT within the right lower extremity.   Electronically Signed   By: Sandi Mariscal M.D.   On: 07/13/2020 09:41     Ethon Wymer, W[facility], MD 07/13/20 [phone]

## 2020-07-15 DIAGNOSIS — M25561 Pain in right knee: Secondary | ICD-10-CM | POA: Diagnosis not present

## 2020-07-17 DIAGNOSIS — R7303 Prediabetes: Secondary | ICD-10-CM | POA: Diagnosis not present

## 2020-07-17 DIAGNOSIS — Z0000 Encounter for general adult medical examination without abnormal findings: Secondary | ICD-10-CM | POA: Diagnosis not present

## 2020-07-17 DIAGNOSIS — I10 Essential (primary) hypertension: Secondary | ICD-10-CM | POA: Diagnosis not present

## 2020-07-17 DIAGNOSIS — I2510 Atherosclerotic heart disease of native coronary artery without angina pectoris: Secondary | ICD-10-CM | POA: Diagnosis not present

## 2020-07-17 DIAGNOSIS — E782 Mixed hyperlipidemia: Secondary | ICD-10-CM | POA: Diagnosis not present

## 2020-07-17 DIAGNOSIS — Z125 Encounter for screening for malignant neoplasm of prostate: Secondary | ICD-10-CM | POA: Diagnosis not present

## 2020-07-17 DIAGNOSIS — Z23 Encounter for immunization: Secondary | ICD-10-CM | POA: Diagnosis not present

## 2020-07-17 DIAGNOSIS — G720 Drug-induced myopathy: Secondary | ICD-10-CM | POA: Diagnosis not present

## 2020-07-17 DIAGNOSIS — I5032 Chronic diastolic (congestive) heart failure: Secondary | ICD-10-CM | POA: Diagnosis not present

## 2020-07-17 DIAGNOSIS — I714 Abdominal aortic aneurysm, without rupture: Secondary | ICD-10-CM | POA: Diagnosis not present

## 2020-07-17 DIAGNOSIS — I509 Heart failure, unspecified: Secondary | ICD-10-CM | POA: Diagnosis not present

## 2020-07-28 ENCOUNTER — Ambulatory Visit: Payer: Medicare HMO | Admitting: Cardiology

## 2020-07-28 ENCOUNTER — Encounter: Payer: Self-pay | Admitting: Cardiology

## 2020-07-28 VITALS — BP 120/60 | HR 94 | Ht 66.0 in | Wt 236.0 lb

## 2020-07-28 DIAGNOSIS — R0600 Dyspnea, unspecified: Secondary | ICD-10-CM

## 2020-07-28 DIAGNOSIS — I5032 Chronic diastolic (congestive) heart failure: Secondary | ICD-10-CM | POA: Diagnosis not present

## 2020-07-28 DIAGNOSIS — R0609 Other forms of dyspnea: Secondary | ICD-10-CM

## 2020-07-28 NOTE — Progress Notes (Signed)
Clinical Summary [name] is a 75 y.o.maleseen today for follow up of the following medical problems.    1. Chronic diastolic HF - 10/6220 echo LVEF 97%, grade I diastolic dysfunction - no recent edema     2. DOE - 09/2019 echo LVEF 98%, grade I diastolic dysfunction 92/1194 PFTs: minimal obstruction, minimal diffusion defect - ongoing symptoms. Denies any chest pain.    01/2020 nuclear stress: prior inferior/interoseptal/septal infarct, no ischemia.  - chronic stable symptoms - sedentary lifestyle due to chronic knee pains/back pains.    3. History of thoracic and abdominal aneurysm repair - followed by vascular     4. OSA - has cpap at home, 2-3 years      5. Dizziness - can occur in any position.  - can feel tired, weak. Blurry vision.  - no associated palpitations - aggressive dietary changes. Does not eat breakfast, lunch only eating dinner.         Past Medical History:  Diagnosis Date   AAA (abdominal aortic aneurysm) ([facility])    CAD (coronary artery disease)    CHF (congestive heart failure) ([facility])    Diverticulosis    Dyspnea    W/ EXERTION    Gout    Gynecomastia    Hx of emphysema    Hyperlipidemia    Hypertension    Lumbar spinal stenosis    OSA (obstructive sleep apnea)    Pedal edema      No Known Allergies   Current Outpatient Medications  Medication Sig Dispense Refill   albuterol (PROVENTIL HFA;VENTOLIN HFA) 108 (90 Base) MCG/ACT inhaler Inhale 1-2 puffs into the lungs every 6 (six) hours as needed for wheezing or shortness of breath. 1 Inhaler 2   allopurinol (ZYLOPRIM) 300 MG tablet Take 1.5 tablets (450 mg total) by mouth daily. 45 tablet 3   aspirin EC 81 MG tablet Take 81 mg by mouth daily.     colchicine 0.6 MG tablet Take 1 tablet (0.6 mg total) by mouth 2 (two) times daily. 8 tablet 0   ibuprofen (ADVIL,MOTRIN) 200 MG tablet Take 800 mg by mouth every 8 (eight) hours as needed (FOR PAIN.).     labetalol (NORMODYNE) 200 MG  tablet Take 200 mg by mouth 2 (two) times daily.     losartan (COZAAR) 50 MG tablet Take 50 mg by mouth daily.   0   predniSONE (DELTASONE) 20 MG tablet Take 2 tablets (40 mg total) by mouth daily. 10 tablet 0   torsemide (DEMADEX) 20 MG tablet Take 40 mg by mouth daily.      No current facility-administered medications for this visit.     Past Surgical History:  Procedure Laterality Date   ABDOMINAL AORTIC ANEURYSM REPAIR  2013 and 2018   BACK SURGERY     CARDIAC CATHETERIZATION  04/2016   [place]   ELBOW ARTHROSCOPY Left 2011   NECK SURGERY     THORACIC AORTIC ENDOVASCULAR STENT GRAFT N/A 10/26/2017   Procedure: THORACIC AORTIC ENDOVASCULAR STENT GRAFT WITH ILIAC EXTENSIONS;  Surgeon: Rosetta Posner, MD;  Location: [facility] OR;  Service: Vascular;  Laterality: N/A;  PATIENT WILL NEED SPINAL CORD DRAIN BY ANESTHESIA   VASECTOMY  1976     No Known Allergies    Family History  Problem Relation Age of Onset   Alzheimer's disease Mother    Heart attack Father    Leukemia Father    Hypertension Father    Gout Maternal Uncle  Hypertension Son      Social History [name] reports that he has been smoking cigars and cigarettes. He started smoking about 58 years ago. He has a 14.50 pack-year smoking history. He has never used smokeless tobacco. [name] reports current alcohol use.   Review of Systems CONSTITUTIONAL: No weight loss, fever, chills, weakness or fatigue.  HEENT: Eyes: No visual loss, blurred vision, double vision or yellow sclerae.No hearing loss, sneezing, congestion, runny nose or sore throat.  SKIN: No rash or itching.  CARDIOVASCULAR: per hpi RESPIRATORY: No shortness of breath, cough or sputum.  GASTROINTESTINAL: No anorexia, nausea, vomiting or diarrhea. No abdominal pain or blood.  GENITOURINARY: No burning on urination, no polyuria NEUROLOGICAL: No headache, dizziness, syncope, paralysis, ataxia, numbness or tingling in the extremities. No change  in bowel or bladder control.  MUSCULOSKELETAL: No muscle, back pain, joint pain or stiffness.  LYMPHATICS: No enlarged nodes. No history of splenectomy.  PSYCHIATRIC: No history of depression or anxiety.  ENDOCRINOLOGIC: No reports of sweating, cold or heat intolerance. No polyuria or polydipsia.  Marland Kitchen   Physical Examination Today's Vitals   07/28/20 0925  BP: 120/60  Pulse: 94  SpO2: 97%  Weight: 236 lb (107 kg)  Height: 5\' 6"  (1.676 m)   Body mass index is 38.09 kg/m.  Gen: resting comfortably, no acute distress HEENT: no scleral icterus, pupils equal round and reactive, no palptable cervical adenopathy,  CV: RRR, no m/rg, no jvd Resp: Clear to auscultation bilaterally GI: abdomen is soft, non-tender, non-distended, normal bowel sounds, no hepatosplenomegaly MSK: extremities are warm, no edema.  Skin: warm, no rash Neuro:  no focal deficits Psych: appropriate affect   Diagnostic Studies  08/2019 echo 1. LV and endocardium poorly visualized. Very rough estimate on LVEF low  normal 50%. Strongly recommend limited echocontrast study to better  evaluate. . Left ventricular ejection fraction, by estimation, is 50%. The  left ventricle has low normal  function. Left ventricular endocardial border not optimally defined to  evaluate regional wall motion. There is mild left ventricular hypertrophy.  Left ventricular diastolic parameters are consistent with Grade I  diastolic dysfunction (impaired  relaxation).   2. Right ventricular systolic function is normal. The right ventricular  size is normal. There is normal pulmonary artery systolic pressure.   3. The mitral valve is normal in structure. Mild mitral valve  regurgitation. No evidence of mitral stenosis.   4. The aortic valve was not well visualized. Aortic valve regurgitation  is not visualized. No aortic stenosis is present.   5. The inferior vena cava is normal in size with greater than 50%  respiratory variability,  suggesting right atrial pressure of 3 mmHg.   09/2019 echo IMPRESSIONS     1. Left ventricular ejection fraction, by estimation, is 50%. The left  ventricle has normal function. The left ventricle has no regional wall  motion abnormalities.   2. Limited echo with contrast to evaluate LV function.   01/2020 nuclear stress There was no ST segment deviation noted during stress. 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 This is an intermediate risk study. Risk based on decreased LVEF, there is no current myocardium at jeopardy. Consider correlating LVEF with echocardiogram. The left ventricular ejection fraction is moderately decreased (30-44%).  Assessment and Plan   1. Chronc diastolic HF - euvolemic, continue diuretic.    2. DOE - extensive testing as reported above fairly benign findings - likely main component is deconditioning, we discussed more regular exercise. With  chronic joint pains discussed possible stationary bike or swimming which may be easiner on joints   3. Dizziness/Weakness - aggressive dietary changes, not eating breakfast or lunch only dinner. Has episodes of weakness, dizziness throughout the day - discussed 3 meals during a day, would suggest majority of calories earlier in the day.        Will message Dr Donnetta Hutching regarding patient's DAPT, not on for cardiac reason, appears has been on several years I would imagine related to prior aneurysm repairs.      Arnoldo Lenis, M.D.

## 2020-07-28 NOTE — Patient Instructions (Signed)
Medication Instructions:  Continue all current medications.   Labwork: none  Testing/Procedures: none  Follow-Up: 6 months   Any Other Special Instructions Will Be Listed Below (If Applicable).   If you need a refill on your cardiac medications before your next appointment, please call your pharmacy.  

## 2020-08-04 DIAGNOSIS — G4733 Obstructive sleep apnea (adult) (pediatric): Secondary | ICD-10-CM | POA: Diagnosis not present

## 2020-08-07 DIAGNOSIS — M1711 Unilateral primary osteoarthritis, right knee: Secondary | ICD-10-CM | POA: Diagnosis not present

## 2020-08-15 ENCOUNTER — Other Ambulatory Visit: Payer: Self-pay | Admitting: Internal Medicine

## 2020-08-15 ENCOUNTER — Other Ambulatory Visit: Payer: Self-pay | Admitting: Cardiology

## 2020-08-15 DIAGNOSIS — M1A9XX1 Chronic gout, unspecified, with tophus (tophi): Secondary | ICD-10-CM

## 2020-08-15 NOTE — Telephone Encounter (Signed)
Next Visit: Nothing scheduled Last Visit: 05/19/2020  Last Fill: 07/12/2020  DX: Chronic tophaceous gout  Current Dose per office note 05/19/2020: Also recommend continuing low dose colchicine for ongoing inflammation till at goal for 3 mos  Labs: 05/19/2020, uric acid: 5.5  Okay to refill Colchicine?

## 2020-08-23 ENCOUNTER — Other Ambulatory Visit: Payer: Self-pay

## 2020-08-23 ENCOUNTER — Emergency Department (HOSPITAL_COMMUNITY): Payer: Medicare HMO | Admitting: Certified Registered"

## 2020-08-23 ENCOUNTER — Encounter (HOSPITAL_COMMUNITY)
Admission: EM | Disposition: A | Discharge status: Home / Self Care | Payer: Self-pay | Source: Home / Self Care | Attending: Orthopedic Surgery

## 2020-08-23 ENCOUNTER — Emergency Department (HOSPITAL_COMMUNITY): Payer: Medicare HMO

## 2020-08-23 ENCOUNTER — Emergency Department (HOSPITAL_COMMUNITY): Payer: Medicare HMO | Admitting: Certified Registered Nurse Anesthetist

## 2020-08-23 ENCOUNTER — Inpatient Hospital Stay (HOSPITAL_COMMUNITY)
Admission: EM | Admit: 2020-08-23 | Discharge: 2020-08-29 | DRG: 904 | Disposition: A | Discharge status: Home / Self Care | Payer: Medicare HMO | Attending: Orthopedic Surgery | Admitting: Orthopedic Surgery

## 2020-08-23 DIAGNOSIS — Z8249 Family history of ischemic heart disease and other diseases of the circulatory system: Secondary | ICD-10-CM

## 2020-08-23 DIAGNOSIS — R42 Dizziness and giddiness: Secondary | ICD-10-CM | POA: Diagnosis present

## 2020-08-23 DIAGNOSIS — I2510 Atherosclerotic heart disease of native coronary artery without angina pectoris: Secondary | ICD-10-CM | POA: Diagnosis present

## 2020-08-23 DIAGNOSIS — Y92019 Unspecified place in single-family (private) house as the place of occurrence of the external cause: Secondary | ICD-10-CM

## 2020-08-23 DIAGNOSIS — Z20822 Contact with and (suspected) exposure to covid-19: Secondary | ICD-10-CM | POA: Diagnosis present

## 2020-08-23 DIAGNOSIS — M7989 Other specified soft tissue disorders: Secondary | ICD-10-CM | POA: Diagnosis not present

## 2020-08-23 DIAGNOSIS — R58 Hemorrhage, not elsewhere classified: Secondary | ICD-10-CM | POA: Diagnosis not present

## 2020-08-23 DIAGNOSIS — S66891A Other injury of other specified muscles, fascia and tendons at wrist and hand level, right hand, initial encounter: Secondary | ICD-10-CM | POA: Diagnosis not present

## 2020-08-23 DIAGNOSIS — Z820 Family history of epilepsy and other diseases of the nervous system: Secondary | ICD-10-CM | POA: Diagnosis not present

## 2020-08-23 DIAGNOSIS — F17210 Nicotine dependence, cigarettes, uncomplicated: Secondary | ICD-10-CM | POA: Diagnosis present

## 2020-08-23 DIAGNOSIS — R0689 Other abnormalities of breathing: Secondary | ICD-10-CM | POA: Diagnosis not present

## 2020-08-23 DIAGNOSIS — Z791 Long term (current) use of non-steroidal anti-inflammatories (NSAID): Secondary | ICD-10-CM

## 2020-08-23 DIAGNOSIS — Z23 Encounter for immunization: Secondary | ICD-10-CM | POA: Diagnosis not present

## 2020-08-23 DIAGNOSIS — Z79899 Other long term (current) drug therapy: Secondary | ICD-10-CM

## 2020-08-23 DIAGNOSIS — R52 Pain, unspecified: Secondary | ICD-10-CM | POA: Diagnosis not present

## 2020-08-23 DIAGNOSIS — W320XXA Accidental handgun discharge, initial encounter: Secondary | ICD-10-CM | POA: Diagnosis not present

## 2020-08-23 DIAGNOSIS — S6401XA Injury of ulnar nerve at wrist and hand level of right arm, initial encounter: Secondary | ICD-10-CM | POA: Diagnosis present

## 2020-08-23 DIAGNOSIS — Z806 Family history of leukemia: Secondary | ICD-10-CM | POA: Diagnosis not present

## 2020-08-23 DIAGNOSIS — Z95820 Peripheral vascular angioplasty status with implants and grafts: Secondary | ICD-10-CM

## 2020-08-23 DIAGNOSIS — J439 Emphysema, unspecified: Secondary | ICD-10-CM | POA: Diagnosis present

## 2020-08-23 DIAGNOSIS — M109 Gout, unspecified: Secondary | ICD-10-CM | POA: Diagnosis present

## 2020-08-23 DIAGNOSIS — S61401A Unspecified open wound of right hand, initial encounter: Secondary | ICD-10-CM | POA: Diagnosis not present

## 2020-08-23 DIAGNOSIS — Z6838 Body mass index (BMI) 38.0-38.9, adult: Secondary | ICD-10-CM | POA: Diagnosis not present

## 2020-08-23 DIAGNOSIS — S6981XA Other specified injuries of right wrist, hand and finger(s), initial encounter: Secondary | ICD-10-CM | POA: Diagnosis not present

## 2020-08-23 DIAGNOSIS — I714 Abdominal aortic aneurysm, without rupture: Secondary | ICD-10-CM | POA: Diagnosis present

## 2020-08-23 DIAGNOSIS — E785 Hyperlipidemia, unspecified: Secondary | ICD-10-CM | POA: Diagnosis present

## 2020-08-23 DIAGNOSIS — I5032 Chronic diastolic (congestive) heart failure: Secondary | ICD-10-CM | POA: Diagnosis not present

## 2020-08-23 DIAGNOSIS — M19041 Primary osteoarthritis, right hand: Secondary | ICD-10-CM | POA: Diagnosis not present

## 2020-08-23 DIAGNOSIS — F17290 Nicotine dependence, other tobacco product, uncomplicated: Secondary | ICD-10-CM | POA: Diagnosis present

## 2020-08-23 DIAGNOSIS — I10 Essential (primary) hypertension: Secondary | ICD-10-CM | POA: Diagnosis not present

## 2020-08-23 DIAGNOSIS — G4733 Obstructive sleep apnea (adult) (pediatric): Secondary | ICD-10-CM | POA: Diagnosis present

## 2020-08-23 DIAGNOSIS — Z743 Need for continuous supervision: Secondary | ICD-10-CM | POA: Diagnosis not present

## 2020-08-23 DIAGNOSIS — M1A9XX1 Chronic gout, unspecified, with tophus (tophi): Secondary | ICD-10-CM

## 2020-08-23 DIAGNOSIS — W3400XA Accidental discharge from unspecified firearms or gun, initial encounter: Secondary | ICD-10-CM

## 2020-08-23 DIAGNOSIS — S6991XA Unspecified injury of right wrist, hand and finger(s), initial encounter: Secondary | ICD-10-CM | POA: Diagnosis present

## 2020-08-23 DIAGNOSIS — S63044A Dislocation of carpometacarpal joint of right thumb, initial encounter: Secondary | ICD-10-CM | POA: Diagnosis present

## 2020-08-23 DIAGNOSIS — I110 Hypertensive heart disease with heart failure: Secondary | ICD-10-CM | POA: Diagnosis not present

## 2020-08-23 DIAGNOSIS — Z7982 Long term (current) use of aspirin: Secondary | ICD-10-CM

## 2020-08-23 DIAGNOSIS — W3409XA Accidental discharge from other specified firearms, initial encounter: Secondary | ICD-10-CM | POA: Diagnosis not present

## 2020-08-23 DIAGNOSIS — I447 Left bundle-branch block, unspecified: Secondary | ICD-10-CM | POA: Diagnosis not present

## 2020-08-23 DIAGNOSIS — Z419 Encounter for procedure for purposes other than remedying health state, unspecified: Secondary | ICD-10-CM

## 2020-08-23 HISTORY — PX: I & D EXTREMITY: SHX5045

## 2020-08-23 LAB — CBC WITH DIFFERENTIAL/PLATELET
Abs Immature Granulocytes: 0.03 10*3/uL (ref 0.00–0.07)
Basophils Absolute: 0 10*3/uL (ref 0.0–0.1)
Basophils Relative: 0 %
Eosinophils Absolute: 0 10*3/uL (ref 0.0–0.5)
Eosinophils Relative: 0 %
HCT: 35.7 % — ABNORMAL LOW (ref 39.0–52.0)
Hemoglobin: 11.5 g/dL — ABNORMAL LOW (ref 13.0–17.0)
Immature Granulocytes: 0 %
Lymphocytes Relative: 10 %
Lymphs Abs: 0.8 10*3/uL (ref 0.7–4.0)
MCH: 30.8 pg (ref 26.0–34.0)
MCHC: 32.2 g/dL (ref 30.0–36.0)
MCV: 95.7 fL (ref 80.0–100.0)
Monocytes Absolute: 0.2 10*3/uL (ref 0.1–1.0)
Monocytes Relative: 2 %
Neutro Abs: 6.5 10*3/uL (ref 1.7–7.7)
Neutrophils Relative %: 88 %
Platelets: 214 10*3/uL (ref 150–400)
RBC: 3.73 MIL/uL — ABNORMAL LOW (ref 4.22–5.81)
RDW: 16.1 % — ABNORMAL HIGH (ref 11.5–15.5)
WBC: 7.5 10*3/uL (ref 4.0–10.5)
nRBC: 0 % (ref 0.0–0.2)

## 2020-08-23 LAB — BASIC METABOLIC PANEL
Anion gap: 7 (ref 5–15)
BUN: 24 mg/dL — ABNORMAL HIGH (ref 8–23)
CO2: 27 mmol/L (ref 22–32)
Calcium: 8.5 mg/dL — ABNORMAL LOW (ref 8.9–10.3)
Chloride: 106 mmol/L (ref 98–111)
Creatinine, Ser: 1.43 mg/dL — ABNORMAL HIGH (ref 0.61–1.24)
GFR, Estimated: 51 mL/min — ABNORMAL LOW (ref >60)
Glucose, Bld: 135 mg/dL — ABNORMAL HIGH (ref 70–99)
Potassium: 5.3 mmol/L — ABNORMAL HIGH (ref 3.5–5.1)
Sodium: 140 mmol/L (ref 135–145)

## 2020-08-23 LAB — RESP PANEL BY RT-PCR (FLU A&B, COVID) ARPGX2
Influenza A by PCR: NEGATIVE
Influenza B by PCR: NEGATIVE
SARS Coronavirus 2 by RT PCR: NEGATIVE

## 2020-08-23 SURGERY — IRRIGATION AND DEBRIDEMENT EXTREMITY
Anesthesia: General | Laterality: Right

## 2020-08-23 SURGERY — IRRIGATION AND DEBRIDEMENT EXTREMITY
Anesthesia: General | Site: Hand | Laterality: Right

## 2020-08-23 MED ORDER — BUPIVACAINE-EPINEPHRINE 0.5% -1:200000 IJ SOLN
INTRAMUSCULAR | Status: AC
  Filled 2020-08-23: qty 1

## 2020-08-23 MED ORDER — ASPIRIN EC 81 MG PO TBEC
81.0000 mg | DELAYED_RELEASE_TABLET | Freq: Every day | ORAL | Status: DC
  Administered 2020-08-24 – 2020-08-28 (×4): 81 mg via ORAL
  Filled 2020-08-23 (×4): qty 1

## 2020-08-23 MED ORDER — MORPHINE SULFATE (PF) 2 MG/ML IV SOLN
0.5000 mg | INTRAVENOUS | Status: DC | PRN
  Administered 2020-08-26 – 2020-08-28 (×4): 0.5 mg via INTRAVENOUS
  Filled 2020-08-23 (×4): qty 1

## 2020-08-23 MED ORDER — DEXAMETHASONE SODIUM PHOSPHATE 10 MG/ML IJ SOLN
INTRAMUSCULAR | Status: AC
  Filled 2020-08-23: qty 1

## 2020-08-23 MED ORDER — TORSEMIDE 20 MG PO TABS
60.0000 mg | ORAL_TABLET | Freq: Every day | ORAL | Status: DC
  Administered 2020-08-24 – 2020-08-25 (×2): 60 mg via ORAL
  Filled 2020-08-23 (×6): qty 3

## 2020-08-23 MED ORDER — ALBUTEROL SULFATE HFA 108 (90 BASE) MCG/ACT IN AERS
INHALATION_SPRAY | RESPIRATORY_TRACT | Status: AC
  Filled 2020-08-23: qty 6.7

## 2020-08-23 MED ORDER — COLCHICINE 0.6 MG PO TABS
0.6000 mg | ORAL_TABLET | Freq: Every day | ORAL | Status: DC
  Administered 2020-08-24 – 2020-08-28 (×4): 0.6 mg via ORAL
  Filled 2020-08-23 (×6): qty 1

## 2020-08-23 MED ORDER — HYDROMORPHONE HCL 1 MG/ML IJ SOLN
1.0000 mg | Freq: Once | INTRAMUSCULAR | Status: AC
  Administered 2020-08-23: 1 mg via INTRAVENOUS
  Filled 2020-08-23: qty 1

## 2020-08-23 MED ORDER — FENTANYL CITRATE (PF) 250 MCG/5ML IJ SOLN
INTRAMUSCULAR | Status: DC | PRN
  Administered 2020-08-23: 100 ug via INTRAVENOUS
  Administered 2020-08-23: 50 ug via INTRAVENOUS
  Administered 2020-08-23: 100 ug via INTRAVENOUS

## 2020-08-23 MED ORDER — ACETAMINOPHEN 325 MG PO TABS
650.0000 mg | ORAL_TABLET | Freq: Four times a day (QID) | ORAL | Status: AC
  Administered 2020-08-23 – 2020-08-24 (×4): 650 mg via ORAL
  Filled 2020-08-23 (×4): qty 2

## 2020-08-23 MED ORDER — ONDANSETRON HCL 4 MG/2ML IJ SOLN: 4.0000 mg | Freq: Four times a day (QID) | INTRAMUSCULAR | Status: DC | PRN

## 2020-08-23 MED ORDER — BUPIVACAINE HCL (PF) 0.5 % IJ SOLN
INTRAMUSCULAR | Status: AC
  Filled 2020-08-23: qty 30

## 2020-08-23 MED ORDER — ADULT MULTIVITAMIN W/MINERALS CH
1.0000 | ORAL_TABLET | Freq: Every day | ORAL | Status: DC
  Administered 2020-08-25 – 2020-08-28 (×3): 1 via ORAL
  Filled 2020-08-23 (×4): qty 1

## 2020-08-23 MED ORDER — DEXAMETHASONE SODIUM PHOSPHATE 10 MG/ML IJ SOLN
INTRAMUSCULAR | Status: DC | PRN
  Administered 2020-08-23: 10 mg via INTRAVENOUS

## 2020-08-23 MED ORDER — TETANUS-DIPHTH-ACELL PERTUSSIS 5-2.5-18.5 LF-MCG/0.5 IM SUSY
0.5000 mL | PREFILLED_SYRINGE | Freq: Once | INTRAMUSCULAR | Status: AC
  Administered 2020-08-23: 0.5 mL via INTRAMUSCULAR
  Filled 2020-08-23: qty 0.5

## 2020-08-23 MED ORDER — LOSARTAN POTASSIUM 50 MG PO TABS
50.0000 mg | ORAL_TABLET | Freq: Every day | ORAL | Status: DC
  Administered 2020-08-24 – 2020-08-28 (×4): 50 mg via ORAL
  Filled 2020-08-23 (×4): qty 1

## 2020-08-23 MED ORDER — ONDANSETRON HCL 4 MG PO TABS: 4.0000 mg | ORAL_TABLET | Freq: Four times a day (QID) | ORAL | Status: DC | PRN

## 2020-08-23 MED ORDER — PROPOFOL 10 MG/ML IV BOLUS
INTRAVENOUS | Status: DC | PRN
  Administered 2020-08-23: 50 mg via INTRAVENOUS
  Administered 2020-08-23: 130 mg via INTRAVENOUS

## 2020-08-23 MED ORDER — BUPIVACAINE HCL (PF) 0.5 % IJ SOLN
INTRAMUSCULAR | Status: DC | PRN
  Administered 2020-08-23: 18 mL

## 2020-08-23 MED ORDER — CEFAZOLIN SODIUM-DEXTROSE 2-3 GM-%(50ML) IV SOLR
INTRAVENOUS | Status: DC | PRN
  Administered 2020-08-23: 2 g via INTRAVENOUS

## 2020-08-23 MED ORDER — SUCCINYLCHOLINE CHLORIDE 200 MG/10ML IV SOSY
PREFILLED_SYRINGE | INTRAVENOUS | Status: AC
  Filled 2020-08-23: qty 10

## 2020-08-23 MED ORDER — FENTANYL CITRATE (PF) 250 MCG/5ML IJ SOLN
INTRAMUSCULAR | Status: AC
  Filled 2020-08-23: qty 5

## 2020-08-23 MED ORDER — SODIUM CHLORIDE 0.9 % IR SOLN
Status: DC | PRN
  Administered 2020-08-23: 3000 mL

## 2020-08-23 MED ORDER — PHENYLEPHRINE 40 MCG/ML (10ML) SYRINGE FOR IV PUSH (FOR BLOOD PRESSURE SUPPORT)
PREFILLED_SYRINGE | INTRAVENOUS | Status: AC
  Filled 2020-08-23: qty 10

## 2020-08-23 MED ORDER — MIDAZOLAM HCL 2 MG/2ML IJ SOLN
INTRAMUSCULAR | Status: AC
  Filled 2020-08-23: qty 2

## 2020-08-23 MED ORDER — MAGNESIUM HYDROXIDE 400 MG/5ML PO SUSP
30.0000 mL | Freq: Every day | ORAL | Status: DC | PRN
  Filled 2020-08-23: qty 30

## 2020-08-23 MED ORDER — ALBUTEROL SULFATE HFA 108 (90 BASE) MCG/ACT IN AERS
INHALATION_SPRAY | RESPIRATORY_TRACT | Status: DC | PRN
  Administered 2020-08-23: 2 via RESPIRATORY_TRACT

## 2020-08-23 MED ORDER — HYDROMORPHONE HCL 1 MG/ML IJ SOLN
1.0000 mg | Freq: Once | INTRAMUSCULAR | Status: AC
Start: 2020-08-23 — End: 2020-08-23

## 2020-08-23 MED ORDER — SODIUM CHLORIDE 0.9 % IV BOLUS
1000.0000 mL | Freq: Once | INTRAVENOUS | Status: AC
  Administered 2020-08-23: 1000 mL via INTRAVENOUS

## 2020-08-23 MED ORDER — SUGAMMADEX SODIUM 200 MG/2ML IV SOLN
INTRAVENOUS | Status: DC | PRN
  Administered 2020-08-23: 200 mg via INTRAVENOUS

## 2020-08-23 MED ORDER — LIDOCAINE 2% (20 MG/ML) 5 ML SYRINGE
INTRAMUSCULAR | Status: DC | PRN
  Administered 2020-08-23: 60 mg via INTRAVENOUS

## 2020-08-23 MED ORDER — EPHEDRINE 5 MG/ML INJ
INTRAVENOUS | Status: AC
  Filled 2020-08-23: qty 10

## 2020-08-23 MED ORDER — OXYMETAZOLINE HCL 0.05 % NA SOLN
1.0000 | Freq: Every day | NASAL | Status: AC
  Administered 2020-08-23 – 2020-08-25 (×3): 1 via NASAL
  Filled 2020-08-23: qty 30

## 2020-08-23 MED ORDER — CELECOXIB 200 MG PO CAPS
200.0000 mg | ORAL_CAPSULE | Freq: Two times a day (BID) | ORAL | Status: DC
  Administered 2020-08-23 – 2020-08-28 (×9): 200 mg via ORAL
  Filled 2020-08-23 (×10): qty 1

## 2020-08-23 MED ORDER — PHENYLEPHRINE 40 MCG/ML (10ML) SYRINGE FOR IV PUSH (FOR BLOOD PRESSURE SUPPORT)
PREFILLED_SYRINGE | INTRAVENOUS | Status: DC | PRN
  Administered 2020-08-23 (×2): 80 ug via INTRAVENOUS
  Administered 2020-08-23: 160 ug via INTRAVENOUS

## 2020-08-23 MED ORDER — ONDANSETRON HCL 4 MG/2ML IJ SOLN
INTRAMUSCULAR | Status: AC
  Filled 2020-08-23: qty 2

## 2020-08-23 MED ORDER — CEFAZOLIN SODIUM-DEXTROSE 2-4 GM/100ML-% IV SOLN
2.0000 g | Freq: Three times a day (TID) | INTRAVENOUS | Status: DC
Start: 2020-08-24 — End: 2020-08-24
  Administered 2020-08-24 (×2): 2 g via INTRAVENOUS
  Filled 2020-08-23 (×3): qty 100

## 2020-08-23 MED ORDER — ALLOPURINOL 300 MG PO TABS
450.0000 mg | ORAL_TABLET | Freq: Every day | ORAL | Status: DC
Start: 2020-08-24 — End: 2020-08-30
  Administered 2020-08-24 – 2020-08-28 (×4): 450 mg via ORAL
  Filled 2020-08-23 (×4): qty 2

## 2020-08-23 MED ORDER — CEFAZOLIN SODIUM 1 G IJ SOLR
INTRAMUSCULAR | Status: AC
  Filled 2020-08-23: qty 20

## 2020-08-23 MED ORDER — HYDROMORPHONE HCL 1 MG/ML IJ SOLN
INTRAMUSCULAR | Status: AC
  Administered 2020-08-23: 1 mg via INTRAVENOUS
  Filled 2020-08-23: qty 1

## 2020-08-23 MED ORDER — LIDOCAINE HCL (PF) 1 % IJ SOLN
INTRAMUSCULAR | Status: AC
  Filled 2020-08-23: qty 30

## 2020-08-23 MED ORDER — ONDANSETRON HCL 4 MG/2ML IJ SOLN
4.0000 mg | Freq: Once | INTRAMUSCULAR | Status: AC
  Administered 2020-08-23: 4 mg via INTRAVENOUS
  Filled 2020-08-23: qty 2

## 2020-08-23 MED ORDER — DOCUSATE SODIUM 100 MG PO CAPS
100.0000 mg | ORAL_CAPSULE | Freq: Two times a day (BID) | ORAL | Status: DC
  Administered 2020-08-23: 100 mg via ORAL
  Filled 2020-08-23 (×7): qty 1

## 2020-08-23 MED ORDER — ONDANSETRON HCL 4 MG/2ML IJ SOLN
INTRAMUSCULAR | Status: DC | PRN
  Administered 2020-08-23: 4 mg via INTRAVENOUS

## 2020-08-23 MED ORDER — ROCURONIUM BROMIDE 10 MG/ML (PF) SYRINGE
PREFILLED_SYRINGE | INTRAVENOUS | Status: DC | PRN
  Administered 2020-08-23: 50 mg via INTRAVENOUS

## 2020-08-23 MED ORDER — EPHEDRINE SULFATE 50 MG/ML IJ SOLN
INTRAMUSCULAR | Status: DC | PRN
  Administered 2020-08-23: 10 mg via INTRAVENOUS

## 2020-08-23 MED ORDER — HYDROMORPHONE HCL 1 MG/ML IJ SOLN: 0.2500 mg | INTRAMUSCULAR | Status: DC | PRN

## 2020-08-23 MED ORDER — OXYCODONE HCL 5 MG PO TABS
5.0000 mg | ORAL_TABLET | Freq: Four times a day (QID) | ORAL | Status: DC | PRN
  Administered 2020-08-24 – 2020-08-29 (×11): 5 mg via ORAL
  Filled 2020-08-23 (×11): qty 1

## 2020-08-23 MED ORDER — SUCCINYLCHOLINE CHLORIDE 200 MG/10ML IV SOSY
PREFILLED_SYRINGE | INTRAVENOUS | Status: DC | PRN
  Administered 2020-08-23: 100 mg via INTRAVENOUS

## 2020-08-23 MED ORDER — LACTATED RINGERS IV SOLN: INTRAVENOUS | Status: DC | PRN

## 2020-08-23 MED ORDER — BISACODYL 10 MG RE SUPP: 10.0000 mg | Freq: Every day | RECTAL | Status: DC | PRN

## 2020-08-23 MED ORDER — ALBUTEROL SULFATE (2.5 MG/3ML) 0.083% IN NEBU: 2.5000 mg | INHALATION_SOLUTION | Freq: Four times a day (QID) | RESPIRATORY_TRACT | Status: DC | PRN

## 2020-08-23 MED ORDER — PROPOFOL 10 MG/ML IV BOLUS
INTRAVENOUS | Status: AC
  Filled 2020-08-23: qty 20

## 2020-08-23 MED ORDER — SODIUM CHLORIDE 0.9 % IV SOLN: INTRAVENOUS | Status: DC

## 2020-08-23 MED ORDER — ROCURONIUM BROMIDE 10 MG/ML (PF) SYRINGE
PREFILLED_SYRINGE | INTRAVENOUS | Status: AC
  Filled 2020-08-23: qty 10

## 2020-08-23 MED ORDER — LIDOCAINE 2% (20 MG/ML) 5 ML SYRINGE
INTRAMUSCULAR | Status: AC
  Filled 2020-08-23: qty 5

## 2020-08-23 MED ORDER — LABETALOL HCL 200 MG PO TABS
200.0000 mg | ORAL_TABLET | Freq: Every day | ORAL | Status: DC
  Administered 2020-08-24 – 2020-08-27 (×3): 200 mg via ORAL
  Filled 2020-08-23 (×6): qty 1

## 2020-08-23 MED ORDER — CEFAZOLIN SODIUM-DEXTROSE 2-4 GM/100ML-% IV SOLN
2.0000 g | Freq: Once | INTRAVENOUS | Status: AC
  Administered 2020-08-23: 2 g via INTRAVENOUS
  Filled 2020-08-23: qty 100

## 2020-08-23 MED ORDER — MIDAZOLAM HCL 2 MG/2ML IJ SOLN
INTRAMUSCULAR | Status: DC | PRN
  Administered 2020-08-23: 2 mg via INTRAVENOUS

## 2020-08-23 MED ORDER — ALBUTEROL SULFATE HFA 108 (90 BASE) MCG/ACT IN AERS: 1.0000 | INHALATION_SPRAY | Freq: Four times a day (QID) | RESPIRATORY_TRACT | Status: DC | PRN

## 2020-08-23 SURGICAL SUPPLY — 41 items
BAG COUNTER SPONGE SURGICOUNT (BAG) ×2 IMPLANT
BLADE SURG 15 STRL LF DISP TIS (BLADE) ×1 IMPLANT
BLADE SURG 15 STRL SS (BLADE) ×1
BNDG COHESIVE 2X5 TAN STRL LF (GAUZE/BANDAGES/DRESSINGS) IMPLANT
BNDG COHESIVE 4X5 TAN STRL (GAUZE/BANDAGES/DRESSINGS) ×2 IMPLANT
BNDG GAUZE ELAST 4 BULKY (GAUZE/BANDAGES/DRESSINGS) ×2 IMPLANT
CANISTER WOUNDNEG PRESSURE 500 (CANNISTER) ×2 IMPLANT
CORD BIPOLAR FORCEPS 12FT (ELECTRODE) ×2 IMPLANT
CUFF TOURN SGL QUICK 18X4 (TOURNIQUET CUFF) ×2 IMPLANT
DRAPE INCISE IOBAN 66X45 STRL (DRAPES) ×2 IMPLANT
DRAPE SURG 17X23 STRL (DRAPES) ×2 IMPLANT
DRSG VAC ATS SM SENSATRAC (GAUZE/BANDAGES/DRESSINGS) ×2 IMPLANT
GAUZE SPONGE 4X4 12PLY STRL (GAUZE/BANDAGES/DRESSINGS) ×2 IMPLANT
GAUZE SPONGE 4X4 12PLY STRL LF (GAUZE/BANDAGES/DRESSINGS) ×2 IMPLANT
GLOVE SRG 8 PF TXTR STRL LF DI (GLOVE) ×1 IMPLANT
GLOVE SURG ENC MOIS LTX SZ7.5 (GLOVE) ×2 IMPLANT
GLOVE SURG LTX SZ6.5 (GLOVE) ×2 IMPLANT
GLOVE SURG PR MICRO ENCORE 7 (GLOVE) ×2 IMPLANT
GLOVE SURG UNDER POLY LF SZ7 (GLOVE) ×4 IMPLANT
GLOVE SURG UNDER POLY LF SZ8 (GLOVE) ×1
GOWN STRL REUS W/ TWL LRG LVL3 (GOWN DISPOSABLE) ×3 IMPLANT
GOWN STRL REUS W/ TWL XL LVL3 (GOWN DISPOSABLE) ×1 IMPLANT
GOWN STRL REUS W/TWL LRG LVL3 (GOWN DISPOSABLE) ×3
GOWN STRL REUS W/TWL XL LVL3 (GOWN DISPOSABLE) ×1
K-WIRE SURGICAL 1.6X102 (WIRE) ×2 IMPLANT
KIT BASIN OR (CUSTOM PROCEDURE TRAY) ×2 IMPLANT
NEEDLE HYPO 22GX1.5 SAFETY (NEEDLE) ×2 IMPLANT
NEEDLE HYPO 25X1 1.5 SAFETY (NEEDLE) ×2 IMPLANT
NS IRRIG 1000ML POUR BTL (IV SOLUTION) ×2 IMPLANT
PACK ORTHO EXTREMITY (CUSTOM PROCEDURE TRAY) ×2 IMPLANT
PAD CAST 4YDX4 CTTN HI CHSV (CAST SUPPLIES) ×1 IMPLANT
PADDING CAST ABS 4INX4YD NS (CAST SUPPLIES)
PADDING CAST ABS COTTON 4X4 ST (CAST SUPPLIES) IMPLANT
PADDING CAST COTTON 4X4 STRL (CAST SUPPLIES) ×1
SET IRRIG Y TYPE TUR BLADDER L (SET/KITS/TRAYS/PACK) ×2 IMPLANT
STOCKINETTE 6  STRL (DRAPES) ×1
STOCKINETTE 6 STRL (DRAPES) ×1 IMPLANT
SUT VIC AB 2-0 UR6 27 (SUTURE) ×2 IMPLANT
SUT VICRYL RAPIDE 4/0 PS 2 (SUTURE) ×8 IMPLANT
SYR CONTROL 10ML LL (SYRINGE) ×2 IMPLANT
UNDERPAD 30X36 HEAVY ABSORB (UNDERPADS AND DIAPERS) ×2 IMPLANT
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
## 2020-08-23 NOTE — Anesthesia Preprocedure Evaluation (Signed)
Anesthesia Evaluation  Patient identified by MRN, date of birth, ID band Patient awake    Reviewed: Allergy & Precautions, H&P , NPO status , Patient's Chart, lab work & pertinent test results, reviewed documented beta blocker date and time   Airway        Dental no notable dental hx.    Pulmonary shortness of breath and with exertion, sleep apnea , COPD,  COPD inhaler, Current Smoker,    Pulmonary exam normal        Cardiovascular hypertension, Pt. on medications and Pt. on home beta blockers + CAD and + DOE       Neuro/Psych negative neurological ROS  negative psych ROS   GI/Hepatic negative GI ROS, Neg liver ROS,   Endo/Other  Morbid obesity  Renal/GU negative Renal ROS  negative genitourinary   Musculoskeletal  (+) Arthritis , Osteoarthritis,    Abdominal   Peds  Hematology negative hematology ROS (+)   Anesthesia Other Findings   Reproductive/Obstetrics negative OB ROS                             Anesthesia Physical Anesthesia Plan  ASA: 3 and emergent  Anesthesia Plan: General   Post-op Pain Management:    Induction: Intravenous, Rapid sequence and Cricoid pressure planned  PONV Risk Score and Plan: 2 and Ondansetron, Dexamethasone and Midazolam  Airway Management Planned: Oral ETT  Additional Equipment:   Intra-op Plan:   Post-operative Plan: Extubation in OR  Informed Consent: I have reviewed the patients History and Physical, chart, labs and discussed the procedure including the risks, benefits and alternatives for the proposed anesthesia with the patient or authorized representative who has indicated his/her understanding and acceptance.     Dental advisory given  Plan Discussed with: CRNA  Anesthesia Plan Comments:         Anesthesia Quick Evaluation

## 2020-08-23 NOTE — ED Notes (Signed)
[place], transferred call to Dr. Eulis Foster. Called Radiology w/request to push patient's xrays, images. Faxed face sheet to [facility].
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
## 2020-08-23 NOTE — Anesthesia Preprocedure Evaluation (Addendum)
Anesthesia Evaluation  Patient identified by MRN, date of birth, ID band Patient awake    Reviewed: Allergy & Precautions, H&P , NPO status , Patient's Chart, lab work & pertinent test results, reviewed documented beta blocker date and time   Airway Mallampati: II  TM Distance: >3 FB Neck ROM: Full    Dental no notable dental hx. (+) Edentulous Upper, Partial Lower, Dental Advisory Given   Pulmonary shortness of breath and with exertion, sleep apnea , COPD,  COPD inhaler, Current Smoker,    Pulmonary exam normal breath sounds clear to auscultation       Cardiovascular hypertension, Pt. on medications and Pt. on home beta blockers + CAD, +CHF and + DOE   Rhythm:Regular Rate:Normal     Neuro/Psych negative neurological ROS  negative psych ROS   GI/Hepatic negative GI ROS, Neg liver ROS,   Endo/Other  Morbid obesity  Renal/GU negative Renal ROS  negative genitourinary   Musculoskeletal  (+) Arthritis , Osteoarthritis,    Abdominal   Peds  Hematology negative hematology ROS (+)   Anesthesia Other Findings   Reproductive/Obstetrics negative OB ROS                            Anesthesia Physical Anesthesia Plan  ASA: 3 and emergent  Anesthesia Plan: General   Post-op Pain Management:    Induction: Intravenous, Rapid sequence and Cricoid pressure planned  PONV Risk Score and Plan: 2 and Ondansetron, Dexamethasone and Midazolam  Airway Management Planned: Oral ETT  Additional Equipment:   Intra-op Plan:   Post-operative Plan: Extubation in OR  Informed Consent: I have reviewed the patients History and Physical, chart, labs and discussed the procedure including the risks, benefits and alternatives for the proposed anesthesia with the patient or authorized representative who has indicated his/her understanding and acceptance.     Dental advisory given  Plan Discussed with:  CRNA  Anesthesia Plan Comments:         Anesthesia Quick Evaluation

## 2020-08-23 NOTE — Transfer of Care (Signed)
Immediate Anesthesia Transfer of Care Note  Patient: Jonathan Garner.  Procedure(s) Performed: IRRIGATION AND DEBRIDEMENT HAND (Right: Hand)  Patient Location: PACU  Anesthesia Type:General  Level of Consciousness: awake, alert  and oriented  Airway & Oxygen Therapy: Patient Spontanous Breathing and Patient connected to nasal cannula oxygen  Post-op Assessment: Report given to RN and Post -op Vital signs reviewed and stable  Post vital signs: Reviewed and stable  Last Vitals:  Vitals Value Taken Time  BP 112/71 08/23/20 2031  Temp    Pulse 92 08/23/20 2033  Resp 13 08/23/20 2033  SpO2 95 % 08/23/20 2033  Vitals shown include unvalidated device data.  Last Pain:  Vitals:   08/23/20 1248  TempSrc:   PainSc: 5          Complications: No notable events documented.

## 2020-08-23 NOTE — Anesthesia Procedure Notes (Signed)
Procedure Name: Intubation Date/Time: 08/23/2020 6:33 PM Performed by: Babs Bertin, CRNA Pre-anesthesia Checklist: Patient identified, Emergency Drugs available, Suction available and Patient being monitored Patient Re-evaluated:Patient Re-evaluated prior to induction Oxygen Delivery Method: Circle System Utilized Preoxygenation: Pre-oxygenation with 100% oxygen Induction Type: Rapid sequence Laryngoscope Size: Mac and 4 Grade View: Grade I Tube type: Oral Tube size: 7.5 mm Number of attempts: 1 Airway Equipment and Method: Stylet and Oral airway Placement Confirmation: ETT inserted through vocal cords under direct vision, positive ETCO2 and breath sounds checked- equal and bilateral Secured at: 22 cm Tube secured with: Tape Dental Injury: Teeth and Oropharynx as per pre-operative assessment
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
## 2020-08-23 NOTE — ED Notes (Signed)
Wound undressed by Dr. Eulis Foster. Small amount of venous bleeding noted. Severs and complex wound to right hand, thumb/index web. Wound redressed by Dr. Eulis Foster.
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
## 2020-08-23 NOTE — ED Notes (Signed)
Surgeon in to talk with patient regarding transfer to [facility] for plastic surgeon consult/treatment. Wound redressed by surgeon and covered with coban to adduct thumb to hand. Scant amount of oozing blood noted. Patient was assisted with urinal by wife. Urine is clear and yellow in color.

## 2020-08-23 NOTE — Anesthesia Postprocedure Evaluation (Signed)
Anesthesia Post Note  Patient: Aashrith Eves.  Procedure(s) Performed: IRRIGATION AND DEBRIDEMENT HAND (Right: Hand)     Patient location during evaluation: PACU Anesthesia Type: General Level of consciousness: awake and alert Pain management: pain level controlled Vital Signs Assessment: post-procedure vital signs reviewed and stable Respiratory status: spontaneous breathing, nonlabored ventilation and respiratory function stable Cardiovascular status: blood pressure returned to baseline and stable Postop Assessment: no apparent nausea or vomiting Anesthetic complications: no   No notable events documented.  Last Vitals:  Vitals:   08/23/20 2118 08/23/20 2133  BP: 123/60 (!) 133/56  Pulse: 88 93  Resp: 13 14  Temp:    SpO2: 95% 96%    Last Pain:  Vitals:   08/23/20 2130  TempSrc:   PainSc: 0-No pain                 Gervase Colberg,W. EDMOND

## 2020-08-23 NOTE — ED Notes (Signed)
Called [facility], transferred call to Dr. Eulis Foster. Called Radiology w/request to push patient's xrays, images. Faxed face sheet to [facility].

## 2020-08-24 ENCOUNTER — Encounter (HOSPITAL_COMMUNITY): Payer: Self-pay | Admitting: Orthopedic Surgery

## 2020-08-24 LAB — BASIC METABOLIC PANEL
Anion gap: 6 (ref 5–15)
BUN: 23 mg/dL (ref 8–23)
CO2: 23 mmol/L (ref 22–32)
Calcium: 8.6 mg/dL — ABNORMAL LOW (ref 8.9–10.3)
Chloride: 110 mmol/L (ref 98–111)
Creatinine, Ser: 1.17 mg/dL (ref 0.61–1.24)
GFR, Estimated: >60 mL/min (ref >60)
Glucose, Bld: 152 mg/dL — ABNORMAL HIGH (ref 70–99)
Potassium: 4.6 mmol/L (ref 3.5–5.1)
Sodium: 139 mmol/L (ref 135–145)

## 2020-08-24 MED ORDER — CEFAZOLIN SODIUM-DEXTROSE 2-4 GM/100ML-% IV SOLN
2.0000 g | Freq: Three times a day (TID) | INTRAVENOUS | Status: DC
  Administered 2020-08-24 – 2020-08-27 (×8): 2 g via INTRAVENOUS
  Filled 2020-08-24 (×10): qty 100

## 2020-08-24 NOTE — Progress Notes (Signed)
  PATIENT ID: Jonathan Garner.  MRN: [id number]  DOB/AGE:  1945/02/23 / 75 y.o.  1 Day Post-Op Procedure(s) (LRB): IRRIGATION AND DEBRIDEMENT HAND (Right) with TMC reduction/stabilization, thenar muscle rpr, skin closure & VAC application  Subjective: Pain is mild.  No c/o chest pain or SOB.   SCDs in place   Objective: Vital signs in last 24 hours: Temp:  [97 F (36.1 C)-98.3 F (36.8 C)] 98.3 F (36.8 C) (07/10 0722) Pulse Rate:  [75-100] 94 (07/10 0722) Resp:  [11-19] 17 (07/10 0722) BP: (112-150)/(56-76) 136/76 (07/10 0722) SpO2:  [86 %-98 %] 95 % (07/10 0722)  Intake/Output from previous day: 07/09 0701 - 07/10 0700 In: 2230 [P.O.:480; I.V.:1700; IV Piggyback:50] Out: 1000 [Urine:1000] Intake/Output this shift: No intake/output data recorded.  Recent Labs    08/23/20 2157  HGB 11.5*   Recent Labs    08/23/20 2157  WBC 7.5  RBC 3.73*  HCT 35.7*  PLT 214   Recent Labs    08/23/20 2157 08/24/20 0840  NA 140 139  K 5.3* 4.6  CL 106 110  CO2 27 23  BUN 24* 23  CREATININE 1.43* 1.17  GLUCOSE 135* 152*  CALCIUM 8.5* 8.6*   No results for input(s): LABPT, INR in the last 72 hours.  Physical Exam: VAC intact and no leak, 75 continuous. Digital tips warm, including thumb  Assessment/Plan: 1 Day Post-Op Procedure(s) (LRB): IRRIGATION AND DEBRIDEMENT HAND (Right)   D/C IV fluids  VTE prophylaxis: intermittent pneumatic compression boots & ambulation Dr. Claudia Desanctis to take patient to OR Tuesday around 3pm for second look I&D Continue VAC in interim  Jonathan Garner, Jonathan Garner, Jonathan Garner  [id number] Office: [phone] Mobile: [phone]  08/24/2020, 12:33 PM

## 2020-08-24 NOTE — Progress Notes (Signed)
Patient had good night sleep, no c/o pain, managing with schedule tylenol- effective.Refused SCD and incentive spirometry at night , Pt concerned about sleep disturbance.

## 2020-08-24 NOTE — Plan of Care (Signed)
  Problem: Activity: Goal: Risk for activity intolerance will decrease Outcome: Progressing   Problem: Pain Managment: Goal: General experience of comfort will improve Outcome: Progressing   Problem: Safety: Goal: Ability to remain free from injury will improve Outcome: Progressing   

## 2020-08-25 DIAGNOSIS — S66891A Other injury of other specified muscles, fascia and tendons at wrist and hand level, right hand, initial encounter: Secondary | ICD-10-CM

## 2020-08-25 MED ORDER — KCL IN DEXTROSE-NACL 20-5-0.45 MEQ/L-%-% IV SOLN
INTRAVENOUS | Status: AC
  Filled 2020-08-25: qty 1000
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
## 2020-08-25 NOTE — Plan of Care (Signed)
  Problem: Safety: Goal: Ability to remain free from injury will improve Outcome: Progressing   Problem: Pain Managment: Goal: General experience of comfort will improve Outcome: Progressing   
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
## 2020-08-25 NOTE — Progress Notes (Signed)
  PATIENT ID: Jonathan Garner.  MRN: [id number]  DOB/AGE:  11-10-1945 / 75 y.o.  2 Days Post-Op Procedure(s) (LRB): IRRIGATION AND DEBRIDEMENT HAND (Right) with TMC reduction/stabilization, thenar muscle rpr, skin closure & VAC application 04-24-10    Subjective: Pain is mild.  No c/o chest pain or SOB.   Standing at present, getting ready to ambulate  Objective: Vital signs in last 24 hours: Temp:  [98.1 F (36.7 C)-98.3 F (36.8 C)] 98.3 F (36.8 C) (07/10 2030) Pulse Rate:  [96-98] 96 (07/10 2030) Resp:  [16] 16 (07/10 2030) BP: (113-120)/(39-64) 113/39 (07/10 2030) SpO2:  [95 %] 95 % (07/10 2030)  Intake/Output from previous day: 07/10 0701 - 07/11 0700 In: -  Out: 500 [Urine:500] Intake/Output this shift: No intake/output data recorded.  Recent Labs    08/23/20 2157  HGB 11.5*    Recent Labs    08/23/20 2157  WBC 7.5  RBC 3.73*  HCT 35.7*  PLT 214    Recent Labs    08/23/20 2157 08/24/20 0840  NA 140 139  K 5.3* 4.6  CL 106 110  CO2 27 23  BUN 24* 23  CREATININE 1.43* 1.17  GLUCOSE 135* 152*  CALCIUM 8.5* 8.6*    No results for input(s): LABPT, INR in the last 72 hours.  Physical Exam: VAC intact and no leak, 76 continuous. Digital tips warm, including thumb  Assessment/Plan: 2 Days Post-Op Procedure(s) (LRB): IRRIGATION AND DEBRIDEMENT HAND (Right)   VTE prophylaxis: intermittent pneumatic compression boots & ambulation Dr. Claudia Desanctis to take patient to OR Tuesday around 3pm for second look I&D NPO after MN tonite Continue VAC in interim  [name] A. Grandville Silos, [place], [place]  [id number] Office: [phone] Mobile: [phone]  08/25/2020, 8:30 AM

## 2020-08-26 ENCOUNTER — Inpatient Hospital Stay (HOSPITAL_COMMUNITY): Payer: Medicare HMO | Admitting: Anesthesiology

## 2020-08-26 ENCOUNTER — Encounter (HOSPITAL_COMMUNITY): Payer: Self-pay | Admitting: Orthopedic Surgery

## 2020-08-26 ENCOUNTER — Encounter (HOSPITAL_COMMUNITY)
Admission: EM | Disposition: A | Discharge status: Home / Self Care | Payer: Self-pay | Source: Home / Self Care | Attending: Orthopedic Surgery

## 2020-08-26 DIAGNOSIS — S66891A Other injury of other specified muscles, fascia and tendons at wrist and hand level, right hand, initial encounter: Secondary | ICD-10-CM

## 2020-08-26 HISTORY — PX: I & D EXTREMITY: SHX5045

## 2020-08-26 LAB — SURGICAL PCR SCREEN
MRSA, PCR: NEGATIVE
Staphylococcus aureus: NEGATIVE

## 2020-08-26 SURGERY — IRRIGATION AND DEBRIDEMENT EXTREMITY
Anesthesia: General | Site: Hand | Laterality: Right

## 2020-08-26 MED ORDER — ONDANSETRON HCL 4 MG/2ML IJ SOLN: 4.0000 mg | Freq: Once | INTRAMUSCULAR | Status: DC | PRN

## 2020-08-26 MED ORDER — CEFAZOLIN SODIUM-DEXTROSE 2-4 GM/100ML-% IV SOLN
2.0000 g | INTRAVENOUS | Status: AC
  Administered 2020-08-26: 2 g via INTRAVENOUS

## 2020-08-26 MED ORDER — PROPOFOL 10 MG/ML IV BOLUS
INTRAVENOUS | Status: AC
  Filled 2020-08-26: qty 20

## 2020-08-26 MED ORDER — MIDAZOLAM HCL 2 MG/2ML IJ SOLN
INTRAMUSCULAR | Status: AC
  Filled 2020-08-26: qty 2

## 2020-08-26 MED ORDER — LIDOCAINE 2% (20 MG/ML) 5 ML SYRINGE
INTRAMUSCULAR | Status: DC | PRN
  Administered 2020-08-26: 100 mg via INTRAVENOUS

## 2020-08-26 MED ORDER — LIDOCAINE 2% (20 MG/ML) 5 ML SYRINGE
INTRAMUSCULAR | Status: AC
  Filled 2020-08-26: qty 5

## 2020-08-26 MED ORDER — ACETAMINOPHEN 10 MG/ML IV SOLN
INTRAVENOUS | Status: AC
  Filled 2020-08-26: qty 100

## 2020-08-26 MED ORDER — BUPIVACAINE HCL 0.25 % IJ SOLN: INTRAMUSCULAR | Status: DC | PRN

## 2020-08-26 MED ORDER — 0.9 % SODIUM CHLORIDE (POUR BTL) OPTIME
TOPICAL | Status: DC | PRN
  Administered 2020-08-26: 1000 mL

## 2020-08-26 MED ORDER — FENTANYL CITRATE (PF) 100 MCG/2ML IJ SOLN
INTRAMUSCULAR | Status: AC
  Filled 2020-08-26: qty 2

## 2020-08-26 MED ORDER — FENTANYL CITRATE (PF) 100 MCG/2ML IJ SOLN
25.0000 ug | INTRAMUSCULAR | Status: DC | PRN
  Administered 2020-08-26 (×2): 50 ug via INTRAVENOUS

## 2020-08-26 MED ORDER — LACTATED RINGERS IV SOLN: INTRAVENOUS | Status: DC

## 2020-08-26 MED ORDER — FENTANYL CITRATE (PF) 250 MCG/5ML IJ SOLN
INTRAMUSCULAR | Status: DC | PRN
  Administered 2020-08-26 (×4): 25 ug via INTRAVENOUS

## 2020-08-26 MED ORDER — CHLORHEXIDINE GLUCONATE 0.12 % MT SOLN: 15.0000 mL | Freq: Once | OROMUCOSAL | Status: AC

## 2020-08-26 MED ORDER — MIDAZOLAM HCL 2 MG/2ML IJ SOLN
INTRAMUSCULAR | Status: DC | PRN
  Administered 2020-08-26 (×2): 1 mg via INTRAVENOUS

## 2020-08-26 MED ORDER — OXYCODONE HCL 5 MG PO TABS
5.0000 mg | ORAL_TABLET | Freq: Once | ORAL | Status: DC | PRN
Start: 2020-08-26 — End: 2020-08-26

## 2020-08-26 MED ORDER — FENTANYL CITRATE (PF) 250 MCG/5ML IJ SOLN
INTRAMUSCULAR | Status: AC
  Filled 2020-08-26: qty 5

## 2020-08-26 MED ORDER — OXYCODONE HCL 5 MG/5ML PO SOLN: 5.0000 mg | Freq: Once | ORAL | Status: DC | PRN

## 2020-08-26 MED ORDER — EPHEDRINE SULFATE 50 MG/ML IJ SOLN
INTRAMUSCULAR | Status: DC | PRN
  Administered 2020-08-26: 5 mg via INTRAVENOUS

## 2020-08-26 MED ORDER — ACETAMINOPHEN 10 MG/ML IV SOLN
1000.0000 mg | Freq: Once | INTRAVENOUS | Status: DC | PRN
  Administered 2020-08-26: 1000 mg via INTRAVENOUS

## 2020-08-26 MED ORDER — ONDANSETRON HCL 4 MG/2ML IJ SOLN
INTRAMUSCULAR | Status: AC
  Filled 2020-08-26: qty 2

## 2020-08-26 MED ORDER — CHLORHEXIDINE GLUCONATE 0.12 % MT SOLN
OROMUCOSAL | Status: AC
  Administered 2020-08-26: 15 mL via OROMUCOSAL
  Filled 2020-08-26: qty 15

## 2020-08-26 MED ORDER — PROPOFOL 10 MG/ML IV BOLUS
INTRAVENOUS | Status: DC | PRN
  Administered 2020-08-26: 150 mg via INTRAVENOUS

## 2020-08-26 MED ORDER — BUPIVACAINE HCL (PF) 0.25 % IJ SOLN
INTRAMUSCULAR | Status: AC
  Filled 2020-08-26: qty 30

## 2020-08-26 MED ORDER — ONDANSETRON HCL 4 MG/2ML IJ SOLN
INTRAMUSCULAR | Status: DC | PRN
  Administered 2020-08-26: 4 mg via INTRAVENOUS

## 2020-08-26 SURGICAL SUPPLY — 36 items
BAG COUNTER SPONGE SURGICOUNT (BAG) ×2 IMPLANT
BLADE SURG 15 STRL LF DISP TIS (BLADE) ×1 IMPLANT
BLADE SURG 15 STRL SS (BLADE) ×2
BNDG ELASTIC 3X5.8 VLCR STR LF (GAUZE/BANDAGES/DRESSINGS) ×2 IMPLANT
BNDG GAUZE ELAST 4 BULKY (GAUZE/BANDAGES/DRESSINGS) ×4 IMPLANT
CANISTER SUCT 1200ML W/VALVE (MISCELLANEOUS) ×2 IMPLANT
CANISTER WOUND CARE 500ML ATS (WOUND CARE) ×2 IMPLANT
CHLORAPREP W/TINT 26 (MISCELLANEOUS) IMPLANT
COVER SURGICAL LIGHT HANDLE (MISCELLANEOUS) ×2 IMPLANT
DRAPE INCISE IOBAN 66X45 STRL (DRAPES) ×2 IMPLANT
DRAPE LAPAROTOMY T 98X78 PEDS (DRAPES) IMPLANT
DRSG ADAPTIC 3X8 NADH LF (GAUZE/BANDAGES/DRESSINGS) ×2 IMPLANT
DRSG EMULSION OIL 3X3 NADH (GAUZE/BANDAGES/DRESSINGS) IMPLANT
DRSG MEPITEL 4X7.2 (GAUZE/BANDAGES/DRESSINGS) ×2 IMPLANT
DRSG TELFA 3X8 NADH (GAUZE/BANDAGES/DRESSINGS) IMPLANT
DRSG VAC ATS LRG SENSATRAC (GAUZE/BANDAGES/DRESSINGS) IMPLANT
DRSG VAC ATS MED SENSATRAC (GAUZE/BANDAGES/DRESSINGS) ×2 IMPLANT
ELECT CAUTERY BLADE 6.4 (BLADE) ×2 IMPLANT
ELECT REM PT RETURN 9FT ADLT (ELECTROSURGICAL) ×2
ELECTRODE REM PT RTRN 9FT ADLT (ELECTROSURGICAL) ×1 IMPLANT
GAUZE SPONGE 4X4 12PLY STRL (GAUZE/BANDAGES/DRESSINGS) ×4 IMPLANT
GLOVE SRG 8 PF TXTR STRL LF DI (GLOVE) ×1 IMPLANT
GLOVE SURG ENC TEXT LTX SZ7.5 (GLOVE) ×4 IMPLANT
GLOVE SURG UNDER POLY LF SZ8 (GLOVE) ×2
GOWN STRL REUS W/ TWL LRG LVL3 (GOWN DISPOSABLE) ×2 IMPLANT
GOWN STRL REUS W/TWL LRG LVL3 (GOWN DISPOSABLE) ×4
KIT BASIN OR (CUSTOM PROCEDURE TRAY) ×2 IMPLANT
KIT TURNOVER KIT A (KITS) ×2 IMPLANT
NS IRRIG 1000ML POUR BTL (IV SOLUTION) ×2 IMPLANT
PACK GENERAL/GYN (CUSTOM PROCEDURE TRAY) ×2 IMPLANT
SOLUTION BETADINE 4OZ (MISCELLANEOUS) ×2 IMPLANT
STAPLER VISISTAT 35W (STAPLE) ×2 IMPLANT
SWAB COLLECTION DEVICE MRSA (MISCELLANEOUS) IMPLANT
SYR CONTROL 10ML LL (SYRINGE) ×2 IMPLANT
TOWEL GREEN STERILE (TOWEL DISPOSABLE) ×2 IMPLANT
TOWEL GREEN STERILE FF (TOWEL DISPOSABLE) ×2 IMPLANT
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
## 2020-08-26 NOTE — Progress Notes (Signed)
Received patient from PACU awake,alert/orientedx4 and able to verbalize needs. VSS; NAD noted; respirations even on 2L O2 via nasal cannula. Dressing to RUE c/d/I.Continuous pulse ox placed. No new orders at this time; Notified ortho via number provided and message left; awaiting call back at this time. Wife at bedside.

## 2020-08-26 NOTE — Interval H&P Note (Signed)
Patient seen an examined. Risk and benefits discussed. Proceed with surgery.

## 2020-08-26 NOTE — Op Note (Signed)
Operative Note   DATE OF OPERATION: 08/26/2020  SURGICAL DEPARTMENT: Plastic Surgery  PREOPERATIVE DIAGNOSES: Right hand wound  POSTOPERATIVE DIAGNOSES:  same  PROCEDURE: Debridement right hand wound including skin subcutaneous tissue and muscle totaling 12 x 5 cm  SURGEON: Talmadge Coventry, MD  ASSISTANT: None  ANESTHESIA:  General.   COMPLICATIONS: None.   INDICATIONS FOR PROCEDURE:  The patient, Jonathan Garner is a 75 y.o. male born on 1945/06/02, is here for treatment of right hand wound MRN: [id number]  CONSENT:  Informed consent was obtained directly from the patient. Risks, benefits and alternatives were fully discussed. Specific risks including but not limited to bleeding, infection, hematoma, seroma, scarring, pain, contracture, asymmetry, wound healing problems, and need for further surgery were all discussed. The patient did have an ample opportunity to have questions answered to satisfaction.   DESCRIPTION OF PROCEDURE:  The patient was taken to the operating room. SCDs were placed and antibiotics were given.  General anesthesia was administered.  The patient's operative site was prepped and draped in a sterile fashion. A time out was performed and all information was confirmed to be correct.  Started by evaluating the wound.  There did appear to be some devitalized skin on the volar aspect of the thumb at approximately the level of the MP flexion crease.  Additionally some of the dorsal skin appeared nonviable as well.  I did begin to conservatively debride this with pickups and a 15 blade.  I debrided the skin and subcutaneous tissue and muscle that seem to be demarcating.  There was some bleeding from the skin edges after completing the debridement but I do suspect that further demarcation may develop.  Overall area of debridement was 12 x 5 cm.  I then irrigated the wound copiously with saline.  There was no outright exposure of flexor tendons or long extensor tendons.   There looks to be some exposure of the first dorsal interosseous tendinous portion close to its insertion.  The K wire from the previous procedure was left in place.  There did appear to be an intact ulnar digital nerve going to the thumb.  There is no sign of infection or purulence.  For dressing I placed Mepitel over the open wound and used a few staples to hold this in position.  Wet-to-dry was then done over top of that with 4 x 4's, Kerlix and Ace wrap.  The patient tolerated the procedure well.  There were no complications. The patient was allowed to wake from anesthesia, extubated and taken to the recovery room in satisfactory condition.

## 2020-08-26 NOTE — Transfer of Care (Signed)
Immediate Anesthesia Transfer of Care Note  Patient: Jonathan Garner.  Procedure(s) Performed: IRRIGATION AND DEBRIDEMENT RIGHT HAND (Right: Hand)  Patient Location: PACU  Anesthesia Type:General  Level of Consciousness: awake  Airway & Oxygen Therapy: Patient Spontanous Breathing  Post-op Assessment: Report given to RN  Post vital signs: stable  Last Vitals:  Vitals Value Taken Time  BP 132/90 08/26/20 1636  Temp    Pulse 86 08/26/20 1641  Resp 18 08/26/20 1641  SpO2 100 % 08/26/20 1641  Vitals shown include unvalidated device data.  Last Pain:  Vitals:   08/26/20 1427  TempSrc: Oral  PainSc: 0-No pain         Complications: No notable events documented.

## 2020-08-26 NOTE — TOC CAGE-AID Note (Signed)
Transition of Care [facility]) - CAGE-AID Screening   Patient Details  Name: Jonathan Garner. MRN: [id number] Date of Birth: 05/20/1945  [facility] CM/SW Contact:    Jonathan Garner, [place] Phone Number: 08/26/2020, 9:27 AM   Clinical Narrative: Pt is unable to participate in Cage Aid, due to pt being intubated.  Tawanda Schall Garner, MSW, LCSW-A Pronouns:  She/Her/Hers                          [place] Clinical Social WorkerTransitions of Care Cell:  [phone] [email or web address]    CAGE-AID Screening: Substance Abuse Screening unable to be completed due to: : Patient unable to participate ((Pt is intubated.))

## 2020-08-26 NOTE — Anesthesia Postprocedure Evaluation (Signed)
Anesthesia Post Note  Patient: [place].  Procedure(s) Performed: IRRIGATION AND DEBRIDEMENT RIGHT HAND (Right: Hand)     Patient location during evaluation: PACU Anesthesia Type: General Level of consciousness: awake and alert Pain management: pain level controlled Vital Signs Assessment: post-procedure vital signs reviewed and stable Respiratory status: spontaneous breathing, nonlabored ventilation, respiratory function stable and patient connected to nasal cannula oxygen Cardiovascular status: blood pressure returned to baseline and stable Postop Assessment: no apparent nausea or vomiting Anesthetic complications: no   No notable events documented.  Last Vitals:  Vitals:   08/26/20 1635 08/26/20 1650  BP: 132/90   Pulse: 94 66  Resp: 12 18  Temp: (!) 36.2 C   SpO2: 96% 94%    Last Pain:  Vitals:   08/26/20 1635  TempSrc:   PainSc: 10-Worst pain ever                 Ladarrell Cornwall S

## 2020-08-26 NOTE — Anesthesia Preprocedure Evaluation (Signed)
Anesthesia Evaluation  Patient identified by MRN, date of birth, ID band Patient awake    Reviewed: Allergy & Precautions, NPO status , Patient's Chart, lab work & pertinent test results  Airway Mallampati: II  TM Distance: >3 FB Neck ROM: Full    Dental no notable dental hx.    Pulmonary sleep apnea , COPD, Current Smoker,    breath sounds clear to auscultation + decreased breath sounds      Cardiovascular hypertension, + Peripheral Vascular Disease and +CHF  Normal cardiovascular exam Rhythm:Regular Rate:Normal     Neuro/Psych negative neurological ROS  negative psych ROS   GI/Hepatic negative GI ROS, Neg liver ROS,   Endo/Other  Morbid obesity  Renal/GU negative Renal ROS  negative genitourinary   Musculoskeletal negative musculoskeletal ROS (+)   Abdominal   Peds negative pediatric ROS (+)  Hematology  (+) anemia ,   Anesthesia Other Findings   Reproductive/Obstetrics negative OB ROS                             Anesthesia Physical Anesthesia Plan  ASA: 3  Anesthesia Plan: General   Post-op Pain Management:    Induction: Intravenous  PONV Risk Score and Plan: 1 and Ondansetron and Treatment may vary due to age or medical condition  Airway Management Planned: LMA  Additional Equipment:   Intra-op Plan:   Post-operative Plan: Extubation in OR  Informed Consent: I have reviewed the patients History and Physical, chart, labs and discussed the procedure including the risks, benefits and alternatives for the proposed anesthesia with the patient or authorized representative who has indicated his/her understanding and acceptance.     Dental advisory given  Plan Discussed with: CRNA and Surgeon  Anesthesia Plan Comments:         Anesthesia Quick Evaluation

## 2020-08-27 ENCOUNTER — Encounter (HOSPITAL_COMMUNITY): Payer: Self-pay | Admitting: Plastic Surgery

## 2020-08-27 LAB — BASIC METABOLIC PANEL
Anion gap: 6 (ref 5–15)
BUN: 18 mg/dL (ref 8–23)
CO2: 24 mmol/L (ref 22–32)
Calcium: 8.4 mg/dL — ABNORMAL LOW (ref 8.9–10.3)
Chloride: 112 mmol/L — ABNORMAL HIGH (ref 98–111)
Creatinine, Ser: 0.78 mg/dL (ref 0.61–1.24)
GFR, Estimated: >60 mL/min (ref >60)
Glucose, Bld: 105 mg/dL — ABNORMAL HIGH (ref 70–99)
Potassium: 4 mmol/L (ref 3.5–5.1)
Sodium: 142 mmol/L (ref 135–145)

## 2020-08-27 NOTE — Progress Notes (Signed)
  PATIENT ID: Jonathan Garner.  MRN: [id number]  DOB/AGE:  [birth year] / 75 y.o.   08-23-20: I&D with TMC reduction/stabilization, thenar muscle rpr, skin closure & VAC application  1-94-17:  2nd look excisional debridement and assessment with Dr. Claudia Desanctis    Subjective: Pain is mild. Reports dressing change today wasn't too bad. Has been contemplating options, and prefers free-flap consideration instead of pedicled groin flap  Objective: Vital signs in last 24 hours: Temp:  [98.3 F (36.8 C)-99.9 F (37.7 C)] 98.3 F (36.8 C) (07/13 1502) Pulse Rate:  [86-100] 94 (07/13 1502) Resp:  [16-17] 17 (07/13 1502) BP: (134-152)/(48-74) 134/65 (07/13 1502) SpO2:  [96 %-97 %] 97 % (07/13 1502)  Intake/Output from previous day: 07/12 0701 - 07/13 0700 In: -  Out: 310 [Urine:300; Blood:10] Intake/Output this shift: Total I/O In: 240 [P.O.:240] Out: -   No results for input(s): HGB in the last 72 hours.  No results for input(s): WBC, RBC, HCT, PLT in the last 72 hours.  Recent Labs    08/27/20 0419  NA 142  K 4.0  CL 112*  CO2 24  BUN 18  CREATININE 0.78  GLUCOSE 105*  CALCIUM 8.4*    No results for input(s): LABPT, INR in the last 72 hours.  Physical Exam: Dressing intact, some LT sens at thumb tip, digits warm  Assessment/Plan: Patient prefers transfer of care to plastic surgery provider whose skills and facility capabilities include free-tissue transfer. Will confer with Dr. Claudia Desanctis to help arrange for such. Decision to be made between Dr. Claudia Desanctis and whomever he is conferring with whether for this patient to d/c home with dressing changes and be eval'd as an outpatient, or to transfer as inpatient.   VTE prophylaxis: intermittent pneumatic compression boots & ambulation   Mery Guadalupe A. Grandville Silos, [place], [place]  [address] Office: [phone] Mobile: [phone]  08/27/2020,  6:23 PM
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
## 2020-08-27 NOTE — Plan of Care (Signed)

## 2020-08-28 MED ORDER — CEFAZOLIN SODIUM-DEXTROSE 2-4 GM/100ML-% IV SOLN
2.0000 g | INTRAVENOUS | Status: AC
  Administered 2020-08-29: 2 g via INTRAVENOUS
  Filled 2020-08-28 (×2): qty 100

## 2020-08-28 NOTE — Progress Notes (Signed)
  PATIENT ID: Jonathan Garner.  MRN: [id number]  DOB/AGE:  [birth year] / 75 y.o.   08-23-20: I&D with TMC reduction/stabilization, thenar muscle rpr, skin closure & VAC application  0-48-88:  2nd look excisional debridement and assessment with Dr. Claudia Desanctis    Subjective: Doing reasonably well, tol dressing changes  Objective: Vital signs in last 24 hours: Temp:  [98.6 F (37 C)-99.2 F (37.3 C)] 99.2 F (37.3 C) (07/14 2121) Pulse Rate:  [81-97] 97 (07/14 2121) Resp:  [16-20] 20 (07/14 2121) BP: (139-145)/(55-87) 145/79 (07/14 2121) SpO2:  [95 %-98 %] 98 % (07/14 2121)  Intake/Output from previous day: 07/13 0701 - 07/14 0700 In: 240 [P.O.:240] Out: 700 [Urine:700] Intake/Output this shift: No intake/output data recorded.  No results for input(s): HGB in the last 72 hours.  No results for input(s): WBC, RBC, HCT, PLT in the last 72 hours.  Recent Labs    08/27/20 0419  NA 142  K 4.0  CL 112*  CO2 24  BUN 18  CREATININE 0.78  GLUCOSE 105*  CALCIUM 8.4*    No results for input(s): LABPT, INR in the last 72 hours.  Physical Exam: Dressing intact, some LT sens at thumb tip, digits warm  Assessment/Plan: Dr. Claudia Desanctis conferred with PS at [facility], who has accepted transfer of care for this patient as an outpatient, recommending first Dr. Claudia Desanctis take patient to OR tomorrow for another I&D and wound assessment, placing integra at that time as well.  I have spoken about this with the patient, who understands and agrees to this plan.  Dr. Claudia Desanctis to take pt to OR on Friday, assess the wound and debride as necessary, and place integra.  Patient can d/c home tomorrow after recovering from surgery, likely to f/u with [facility] PS on Wednesday next week in clinic as outpatient.  NO need for wound care, etc in the interim, leaving the bolster dressing over the Integra until f/u at [facility].  Will d/c with plan for oral analgesics.   VTE prophylaxis: intermittent pneumatic compression boots &  ambulation   Jonathan Garner, [place], [place]  [address] Office: [phone] Mobile: [phone]  08/28/2020, 11:49 PM

## 2020-08-29 ENCOUNTER — Encounter (HOSPITAL_COMMUNITY)
Admission: EM | Disposition: A | Discharge status: Home / Self Care | Payer: Self-pay | Source: Home / Self Care | Attending: Orthopedic Surgery

## 2020-08-29 ENCOUNTER — Encounter (HOSPITAL_COMMUNITY): Payer: Self-pay | Admitting: Orthopedic Surgery

## 2020-08-29 ENCOUNTER — Inpatient Hospital Stay (HOSPITAL_COMMUNITY): Payer: Medicare HMO | Admitting: Certified Registered"

## 2020-08-29 DIAGNOSIS — Z23 Encounter for immunization: Secondary | ICD-10-CM | POA: Diagnosis not present

## 2020-08-29 DIAGNOSIS — S66891A Other injury of other specified muscles, fascia and tendons at wrist and hand level, right hand, initial encounter: Secondary | ICD-10-CM

## 2020-08-29 DIAGNOSIS — Z20822 Contact with and (suspected) exposure to covid-19: Secondary | ICD-10-CM | POA: Diagnosis not present

## 2020-08-29 DIAGNOSIS — I5032 Chronic diastolic (congestive) heart failure: Secondary | ICD-10-CM | POA: Diagnosis not present

## 2020-08-29 DIAGNOSIS — Z95820 Peripheral vascular angioplasty status with implants and grafts: Secondary | ICD-10-CM | POA: Diagnosis not present

## 2020-08-29 DIAGNOSIS — S63044A Dislocation of carpometacarpal joint of right thumb, initial encounter: Secondary | ICD-10-CM | POA: Diagnosis not present

## 2020-08-29 DIAGNOSIS — J439 Emphysema, unspecified: Secondary | ICD-10-CM | POA: Diagnosis not present

## 2020-08-29 DIAGNOSIS — Z6838 Body mass index (BMI) 38.0-38.9, adult: Secondary | ICD-10-CM | POA: Diagnosis not present

## 2020-08-29 DIAGNOSIS — S61401A Unspecified open wound of right hand, initial encounter: Secondary | ICD-10-CM | POA: Diagnosis not present

## 2020-08-29 DIAGNOSIS — I110 Hypertensive heart disease with heart failure: Secondary | ICD-10-CM | POA: Diagnosis not present

## 2020-08-29 DIAGNOSIS — I2510 Atherosclerotic heart disease of native coronary artery without angina pectoris: Secondary | ICD-10-CM | POA: Diagnosis not present

## 2020-08-29 HISTORY — PX: I & D EXTREMITY: SHX5045

## 2020-08-29 SURGERY — IRRIGATION AND DEBRIDEMENT EXTREMITY
Anesthesia: General | Site: Hand | Laterality: Right

## 2020-08-29 MED ORDER — BUPIVACAINE-EPINEPHRINE (PF) 0.25% -1:200000 IJ SOLN
INTRAMUSCULAR | Status: AC
  Filled 2020-08-29: qty 30

## 2020-08-29 MED ORDER — OXYCODONE HCL 5 MG PO TABS
5.0000 mg | ORAL_TABLET | Freq: Once | ORAL | Status: DC | PRN
Start: 2020-08-29 — End: 2020-08-29

## 2020-08-29 MED ORDER — MIDAZOLAM HCL 2 MG/2ML IJ SOLN
INTRAMUSCULAR | Status: AC
  Filled 2020-08-29: qty 2

## 2020-08-29 MED ORDER — ORAL CARE MOUTH RINSE: 15.0000 mL | Freq: Once | OROMUCOSAL | Status: AC

## 2020-08-29 MED ORDER — LIDOCAINE 2% (20 MG/ML) 5 ML SYRINGE
INTRAMUSCULAR | Status: AC
  Filled 2020-08-29: qty 5

## 2020-08-29 MED ORDER — MIDAZOLAM HCL 5 MG/5ML IJ SOLN
INTRAMUSCULAR | Status: DC | PRN
  Administered 2020-08-29: 2 mg via INTRAVENOUS

## 2020-08-29 MED ORDER — PHENYLEPHRINE 40 MCG/ML (10ML) SYRINGE FOR IV PUSH (FOR BLOOD PRESSURE SUPPORT)
PREFILLED_SYRINGE | INTRAVENOUS | Status: AC
  Filled 2020-08-29: qty 10

## 2020-08-29 MED ORDER — LIDOCAINE 2% (20 MG/ML) 5 ML SYRINGE
INTRAMUSCULAR | Status: DC | PRN
  Administered 2020-08-29: 100 mg via INTRAVENOUS

## 2020-08-29 MED ORDER — FENTANYL CITRATE (PF) 250 MCG/5ML IJ SOLN
INTRAMUSCULAR | Status: AC
  Filled 2020-08-29: qty 5

## 2020-08-29 MED ORDER — ONDANSETRON HCL 4 MG/2ML IJ SOLN
INTRAMUSCULAR | Status: DC | PRN
  Administered 2020-08-29: 4 mg via INTRAVENOUS

## 2020-08-29 MED ORDER — OXYCODONE HCL 5 MG PO TABS: 5.0000 mg | ORAL_TABLET | Freq: Four times a day (QID) | ORAL | 0 refills | Status: DC | PRN

## 2020-08-29 MED ORDER — LABETALOL HCL 200 MG PO TABS
200.0000 mg | ORAL_TABLET | Freq: Once | ORAL | Status: AC
  Administered 2020-08-29: 200 mg via ORAL
  Filled 2020-08-29: qty 1

## 2020-08-29 MED ORDER — ONDANSETRON HCL 4 MG/2ML IJ SOLN
4.0000 mg | Freq: Once | INTRAMUSCULAR | Status: DC | PRN
Start: 2020-08-29 — End: 2020-08-29

## 2020-08-29 MED ORDER — PROPOFOL 10 MG/ML IV BOLUS
INTRAVENOUS | Status: AC
  Filled 2020-08-29: qty 20

## 2020-08-29 MED ORDER — CHLORHEXIDINE GLUCONATE 0.12 % MT SOLN: 15.0000 mL | Freq: Once | OROMUCOSAL | Status: AC

## 2020-08-29 MED ORDER — 0.9 % SODIUM CHLORIDE (POUR BTL) OPTIME
TOPICAL | Status: DC | PRN
  Administered 2020-08-29: 1000 mL

## 2020-08-29 MED ORDER — CELECOXIB 200 MG PO CAPS: 200.0000 mg | ORAL_CAPSULE | Freq: Every day | ORAL | 0 refills | Status: DC

## 2020-08-29 MED ORDER — ACETAMINOPHEN 10 MG/ML IV SOLN: 1000.0000 mg | Freq: Once | INTRAVENOUS | Status: DC | PRN

## 2020-08-29 MED ORDER — PROPOFOL 10 MG/ML IV BOLUS
INTRAVENOUS | Status: DC | PRN
  Administered 2020-08-29: 160 mg via INTRAVENOUS
  Administered 2020-08-29: 40 mg via INTRAVENOUS

## 2020-08-29 MED ORDER — OXYCODONE HCL 5 MG/5ML PO SOLN: 5.0000 mg | Freq: Once | ORAL | Status: DC | PRN

## 2020-08-29 MED ORDER — CHLORHEXIDINE GLUCONATE 0.12 % MT SOLN
OROMUCOSAL | Status: AC
  Administered 2020-08-29: 15 mL via OROMUCOSAL
  Filled 2020-08-29: qty 15

## 2020-08-29 MED ORDER — LACTATED RINGERS IV SOLN: INTRAVENOUS | Status: DC

## 2020-08-29 MED ORDER — COLCHICINE 0.6 MG PO TABS: 0.6000 mg | ORAL_TABLET | Freq: Every day | ORAL | Status: DC

## 2020-08-29 MED ORDER — FENTANYL CITRATE (PF) 100 MCG/2ML IJ SOLN: 25.0000 ug | INTRAMUSCULAR | Status: DC | PRN

## 2020-08-29 MED ORDER — ACETAMINOPHEN 325 MG PO TABS: 650.0000 mg | ORAL_TABLET | Freq: Four times a day (QID) | ORAL | Status: DC

## 2020-08-29 MED ORDER — PHENYLEPHRINE 40 MCG/ML (10ML) SYRINGE FOR IV PUSH (FOR BLOOD PRESSURE SUPPORT)
PREFILLED_SYRINGE | INTRAVENOUS | Status: DC | PRN
  Administered 2020-08-29: 40 ug via INTRAVENOUS

## 2020-08-29 MED ORDER — BUPIVACAINE-EPINEPHRINE (PF) 0.25% -1:200000 IJ SOLN
INTRAMUSCULAR | Status: DC | PRN
  Administered 2020-08-29: 30 mL

## 2020-08-29 MED ORDER — DEXAMETHASONE SODIUM PHOSPHATE 10 MG/ML IJ SOLN
INTRAMUSCULAR | Status: DC | PRN
  Administered 2020-08-29: 5 mg via INTRAVENOUS

## 2020-08-29 MED ORDER — FENTANYL CITRATE (PF) 100 MCG/2ML IJ SOLN
INTRAMUSCULAR | Status: DC | PRN
  Administered 2020-08-29 (×2): 50 ug via INTRAVENOUS

## 2020-08-29 SURGICAL SUPPLY — 43 items
BAG COUNTER SPONGE SURGICOUNT (BAG) ×2 IMPLANT
BAG DECANTER FOR FLEXI CONT (MISCELLANEOUS) ×2 IMPLANT
BNDG ELASTIC 3X5.8 VLCR STR LF (GAUZE/BANDAGES/DRESSINGS) ×4 IMPLANT
BNDG ELASTIC 4X5.8 VLCR STR LF (GAUZE/BANDAGES/DRESSINGS) IMPLANT
BNDG GAUZE ELAST 4 BULKY (GAUZE/BANDAGES/DRESSINGS) ×4 IMPLANT
CORD BIPOLAR FORCEPS 12FT (ELECTRODE) IMPLANT
CUFF TOURN SGL QUICK 18X4 (TOURNIQUET CUFF) IMPLANT
DRAPE SURG 17X23 STRL (DRAPES) ×2 IMPLANT
ELECT REM PT RETURN 9FT ADLT (ELECTROSURGICAL)
ELECTRODE REM PT RTRN 9FT ADLT (ELECTROSURGICAL) IMPLANT
GAUZE PACKING IODOFORM 1/4X15 (PACKING) IMPLANT
GAUZE SPONGE 4X4 12PLY STRL (GAUZE/BANDAGES/DRESSINGS) ×2 IMPLANT
GAUZE XEROFORM 1X8 LF (GAUZE/BANDAGES/DRESSINGS) IMPLANT
GAUZE XEROFORM 5X9 LF (GAUZE/BANDAGES/DRESSINGS) ×4 IMPLANT
GLOVE SURG ENC TEXT LTX SZ8 (GLOVE) ×2 IMPLANT
GOWN STRL REUS W/ TWL LRG LVL3 (GOWN DISPOSABLE) ×2 IMPLANT
GOWN STRL REUS W/TWL LRG LVL3 (GOWN DISPOSABLE) ×4
HANDPIECE INTERPULSE COAX TIP (DISPOSABLE)
IV NS IRRIG 3000ML ARTHROMATIC (IV SOLUTION) IMPLANT
KIT BASIN OR (CUSTOM PROCEDURE TRAY) ×2 IMPLANT
KIT TURNOVER KIT B (KITS) ×2 IMPLANT
MANIFOLD NEPTUNE II (INSTRUMENTS) IMPLANT
MATRIX TISSUE MESHED BI 4X10 (Tissue) ×2 IMPLANT
NEEDLE HYPO 25GX1X1/2 BEV (NEEDLE) IMPLANT
NS IRRIG 1000ML POUR BTL (IV SOLUTION) ×2 IMPLANT
PACK ORTHO EXTREMITY (CUSTOM PROCEDURE TRAY) ×2 IMPLANT
PAD ARMBOARD 7.5X6 YLW CONV (MISCELLANEOUS) ×4 IMPLANT
PAD CAST 4YDX4 CTTN HI CHSV (CAST SUPPLIES) IMPLANT
PADDING CAST COTTON 4X4 STRL (CAST SUPPLIES)
SET CYSTO W/LG BORE CLAMP LF (SET/KITS/TRAYS/PACK) IMPLANT
SET HNDPC FAN SPRY TIP SCT (DISPOSABLE) IMPLANT
SOAP 2 % CHG 4 OZ (WOUND CARE) IMPLANT
SPLINT FIBERGLASS 3X35 (CAST SUPPLIES) ×2 IMPLANT
SPONGE T-LAP 18X18 ~~LOC~~+RFID (SPONGE) ×2 IMPLANT
SPONGE T-LAP 4X18 ~~LOC~~+RFID (SPONGE) ×2 IMPLANT
SUT PDS AB 3-0 SH 27 (SUTURE) ×4 IMPLANT
SWAB CULTURE ESWAB REG 1ML (MISCELLANEOUS) IMPLANT
SYR CONTROL 10ML LL (SYRINGE) IMPLANT
TOWEL GREEN STERILE (TOWEL DISPOSABLE) ×2 IMPLANT
TOWEL GREEN STERILE FF (TOWEL DISPOSABLE) ×2 IMPLANT
TUBE CONNECTING 12X1/4 (SUCTIONS) ×2 IMPLANT
WATER STERILE IRR 1000ML POUR (IV SOLUTION) ×2 IMPLANT
YANKAUER SUCT BULB TIP NO VENT (SUCTIONS) ×2 IMPLANT
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
## 2020-08-29 NOTE — Progress Notes (Signed)
Orthopedic Tech Progress Note Patient Details:  Jonathan Garner. Nov 18, 1945 [id number]  Ortho Devices Type of Ortho Device: Arm sling Ortho Device/Splint Location: RUE Ortho Device/Splint Interventions: Ordered, Adjustment, Application   Post Interventions Patient Tolerated: Well Instructions Provided: Adjustment of device, Care of device, Poper ambulation with device  Zala Degrasse 08/29/2020, 8:55 PM

## 2020-08-29 NOTE — Transfer of Care (Signed)
Immediate Anesthesia Transfer of Care Note  Patient: Jonathan Garner.  Procedure(s) Performed: INCISION AND DEBRIDEMENT, HAND (Right: Hand) APPLICATION OF INTEGRA (Right: Hand)  Patient Location: PACU  Anesthesia Type:General  Level of Consciousness: drowsy and patient cooperative  Airway & Oxygen Therapy: Patient Spontanous Breathing and Patient connected to nasal cannula oxygen  Post-op Assessment: Report given to RN and Post -op Vital signs reviewed and stable  Post vital signs: Reviewed and stable  Last Vitals:  Vitals Value Taken Time  BP 117/63 08/29/20 1503  Temp    Pulse 78 08/29/20 1504  Resp 21 08/29/20 1504  SpO2 95 % 08/29/20 1504  Vitals shown include unvalidated device data.  Last Pain:  Vitals:   08/29/20 1258  TempSrc: Oral  PainSc:       Patients Stated Pain Goal: 3 (48/54/62 7035)  Complications: No notable events documented.
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
## 2020-08-29 NOTE — Progress Notes (Signed)
Patient went for a procedure this morning. Surgeon was at the bedside, asked the surgeon if patient could receive morning medications. He states depends on what they are. I asked could patient have pain medication he said yes. There was no clear answer given in regards to morning medication so I made him aware that I was going to hold and not give any morning medications.  Patient came back from procedure several hours later. Patient was still sleeping off and on after arrival to the floor. Patient has began to come around fully. PACU said to watch patient for awhile. Patient has been made aware. Will discharge sometime this evening.

## 2020-08-29 NOTE — Anesthesia Preprocedure Evaluation (Signed)
Anesthesia Evaluation  Patient identified by MRN, date of birth, ID band Patient awake    Reviewed: Allergy & Precautions, NPO status , Patient's Chart, lab work & pertinent test results  Airway Mallampati: II  TM Distance: >3 FB Neck ROM: Full    Dental no notable dental hx.    Pulmonary sleep apnea , COPD, Current Smoker and Patient abstained from smoking.,    Pulmonary exam normal breath sounds clear to auscultation       Cardiovascular hypertension, + CAD and + Peripheral Vascular Disease  Normal cardiovascular exam Rhythm:Regular Rate:Normal     Neuro/Psych negative neurological ROS  negative psych ROS   GI/Hepatic negative GI ROS, Neg liver ROS,   Endo/Other  negative endocrine ROS  Renal/GU negative Renal ROS  negative genitourinary   Musculoskeletal negative musculoskeletal ROS (+)   Abdominal   Peds negative pediatric ROS (+)  Hematology negative hematology ROS (+)   Anesthesia Other Findings   Reproductive/Obstetrics negative OB ROS                             Anesthesia Physical Anesthesia Plan  ASA: 3  Anesthesia Plan: General   Post-op Pain Management:    Induction: Intravenous  PONV Risk Score and Plan: 1 and Ondansetron and Treatment may vary due to age or medical condition  Airway Management Planned: Oral ETT  Additional Equipment:   Intra-op Plan:   Post-operative Plan: Extubation in OR  Informed Consent: I have reviewed the patients History and Physical, chart, labs and discussed the procedure including the risks, benefits and alternatives for the proposed anesthesia with the patient or authorized representative who has indicated his/her understanding and acceptance.     Dental advisory given  Plan Discussed with: CRNA and Surgeon  Anesthesia Plan Comments:         Anesthesia Quick Evaluation

## 2020-08-29 NOTE — Anesthesia Procedure Notes (Signed)
Procedure Name: LMA Insertion Date/Time: 08/29/2020 1:44 PM Performed by: Moshe Salisbury, CRNA Pre-anesthesia Checklist: Patient identified, Emergency Drugs available, Suction available and Patient being monitored Patient Re-evaluated:Patient Re-evaluated prior to induction Oxygen Delivery Method: Circle System Utilized Preoxygenation: Pre-oxygenation with 100% oxygen Induction Type: IV induction Ventilation: Mask ventilation without difficulty LMA: LMA inserted LMA Size: 5.0 Number of attempts: 1 Airway Equipment and Method: Bite block Placement Confirmation: positive ETCO2 Tube secured with: Tape Dental Injury: Teeth and Oropharynx as per pre-operative assessment
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
## 2020-08-29 NOTE — Brief Op Note (Signed)
08/29/2020  2:58 PM  PATIENT:  Jonathan Garner.  75 y.o. male  PRE-OPERATIVE DIAGNOSIS:  right hand wound  POST-OPERATIVE DIAGNOSIS:  right hand wound  PROCEDURE:  Procedure(s): INCISION AND DEBRIDEMENT, HAND (Right) APPLICATION OF INTEGRA (Right)  SURGEON:  Surgeon(s) and Role:    * Endi Lagman, Steffanie Dunn, MD - Primary  PHYSICIAN ASSISTANT: [place], RNFA  ASSISTANTS: none   ANESTHESIA:   general  EBL:  10   BLOOD ADMINISTERED:none  DRAINS: none   LOCAL MEDICATIONS USED:  MARCAINE     SPECIMEN:  No Specimen  DISPOSITION OF SPECIMEN:  PATHOLOGY  COUNTS:  YES  TOURNIQUET:  * Missing tourniquet times found for documented tourniquets in log: [id number] *  DICTATION: .Dragon Dictation  PLAN OF CARE: Admit to inpatient   PATIENT DISPOSITION:  PACU - hemodynamically stable.   Delay start of Pharmacological VTE agent (>24hrs) due to surgical blood loss or risk of bleeding: not applicable

## 2020-08-29 NOTE — Discharge Instructions (Addendum)
Keep your bandages on the right hand clean and dry and in place until seen by [facility] next Wednesday.  Dr. Cristie Hem "Michela Pitcher, MD will see you in clinic in [place]. His office number is [phone]) [phone].  If there are any issues feel free to contact Dr. Keane Scrape office at [phone] to help facilitate the referral.  Dr. Lourena Simmonds office should contact you with an appointment time.  Really work on making your fingers move into a full fist and then also fully straighten them.  Elevating your hand will help to reduce swelling.

## 2020-08-29 NOTE — Care Management Important Message (Signed)
Important Message  Patient Details  Name: Jonathan Garner. MRN: [id number] Date of Birth: Jun 05, 1945   Medicare Important Message Given:  Yes - Important Message mailed due to current National Emergency   Verbal consent obtained due to current National Emergency  Relationship to patient: Spouse/Significant Other Contact Name: Micheal Likens Call Date: 08/29/20  Time: 1151 Phone: [phone] Outcome: Spoke with contact Important Message mailed to: Patient address on file   [name] 08/29/2020, 11:52 AM
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
## 2020-08-30 ENCOUNTER — Other Ambulatory Visit: Payer: Self-pay | Admitting: Internal Medicine

## 2020-08-30 DIAGNOSIS — M1A9XX1 Chronic gout, unspecified, with tophus (tophi): Secondary | ICD-10-CM

## 2020-09-01 ENCOUNTER — Encounter (HOSPITAL_COMMUNITY): Payer: Self-pay | Admitting: Plastic Surgery

## 2020-09-01 NOTE — Anesthesia Postprocedure Evaluation (Signed)
Anesthesia Post Note  Patient: Jonathan Garner.  Procedure(s) Performed: INCISION AND DEBRIDEMENT, HAND (Right: Hand) APPLICATION OF INTEGRA (Right: Hand)     Patient location during evaluation: PACU Anesthesia Type: General Level of consciousness: awake and alert Pain management: pain level controlled Vital Signs Assessment: post-procedure vital signs reviewed and stable Respiratory status: spontaneous breathing, nonlabored ventilation, respiratory function stable and patient connected to nasal cannula oxygen Cardiovascular status: blood pressure returned to baseline and stable Postop Assessment: no apparent nausea or vomiting Anesthetic complications: no   No notable events documented.  Last Vitals:  Vitals:   08/29/20 1632 08/29/20 1838  BP: 132/68 (!) 130/55  Pulse: 75 88  Resp: 16 16  Temp: 36.5 C 36.4 C  SpO2: 93% 97%    Last Pain:  Vitals:   08/29/20 1838  TempSrc: Oral  PainSc:                  Wendel Homeyer S

## 2020-09-03 ENCOUNTER — Other Ambulatory Visit: Payer: Self-pay

## 2020-09-03 DIAGNOSIS — S61431D Puncture wound without foreign body of right hand, subsequent encounter: Secondary | ICD-10-CM | POA: Diagnosis not present

## 2020-09-03 DIAGNOSIS — I712 Thoracic aortic aneurysm, without rupture, unspecified: Secondary | ICD-10-CM

## 2020-09-04 DIAGNOSIS — S61401A Unspecified open wound of right hand, initial encounter: Secondary | ICD-10-CM | POA: Diagnosis not present

## 2020-09-04 DIAGNOSIS — R69 Illness, unspecified: Secondary | ICD-10-CM | POA: Diagnosis not present

## 2020-09-04 DIAGNOSIS — S61431S Puncture wound without foreign body of right hand, sequela: Secondary | ICD-10-CM | POA: Diagnosis not present

## 2020-09-11 DIAGNOSIS — S61431A Puncture wound without foreign body of right hand, initial encounter: Secondary | ICD-10-CM | POA: Diagnosis not present

## 2020-09-11 DIAGNOSIS — S61431D Puncture wound without foreign body of right hand, subsequent encounter: Secondary | ICD-10-CM | POA: Diagnosis not present

## 2020-09-12 DIAGNOSIS — E876 Hypokalemia: Secondary | ICD-10-CM | POA: Diagnosis not present

## 2020-09-12 DIAGNOSIS — Z452 Encounter for adjustment and management of vascular access device: Secondary | ICD-10-CM | POA: Diagnosis not present

## 2020-09-12 DIAGNOSIS — D649 Anemia, unspecified: Secondary | ICD-10-CM | POA: Diagnosis not present

## 2020-09-12 DIAGNOSIS — R6521 Severe sepsis with septic shock: Secondary | ICD-10-CM | POA: Diagnosis not present

## 2020-09-12 DIAGNOSIS — Z955 Presence of coronary angioplasty implant and graft: Secondary | ICD-10-CM | POA: Diagnosis not present

## 2020-09-12 DIAGNOSIS — D62 Acute posthemorrhagic anemia: Secondary | ICD-10-CM | POA: Diagnosis not present

## 2020-09-12 DIAGNOSIS — J9601 Acute respiratory failure with hypoxia: Secondary | ICD-10-CM | POA: Diagnosis not present

## 2020-09-12 DIAGNOSIS — I249 Acute ischemic heart disease, unspecified: Secondary | ICD-10-CM | POA: Diagnosis not present

## 2020-09-12 DIAGNOSIS — G9340 Encephalopathy, unspecified: Secondary | ICD-10-CM | POA: Diagnosis not present

## 2020-09-12 DIAGNOSIS — I714 Abdominal aortic aneurysm, without rupture: Secondary | ICD-10-CM | POA: Diagnosis not present

## 2020-09-12 DIAGNOSIS — J189 Pneumonia, unspecified organism: Secondary | ICD-10-CM | POA: Diagnosis not present

## 2020-09-12 DIAGNOSIS — J811 Chronic pulmonary edema: Secondary | ICD-10-CM | POA: Diagnosis not present

## 2020-09-12 DIAGNOSIS — R0603 Acute respiratory distress: Secondary | ICD-10-CM | POA: Diagnosis not present

## 2020-09-12 DIAGNOSIS — I700 Atherosclerosis of aorta: Secondary | ICD-10-CM | POA: Diagnosis not present

## 2020-09-12 DIAGNOSIS — R059 Cough, unspecified: Secondary | ICD-10-CM | POA: Diagnosis not present

## 2020-09-12 DIAGNOSIS — R0689 Other abnormalities of breathing: Secondary | ICD-10-CM | POA: Diagnosis not present

## 2020-09-12 DIAGNOSIS — J439 Emphysema, unspecified: Secondary | ICD-10-CM | POA: Diagnosis not present

## 2020-09-12 DIAGNOSIS — I441 Atrioventricular block, second degree: Secondary | ICD-10-CM | POA: Diagnosis not present

## 2020-09-12 DIAGNOSIS — I214 Non-ST elevation (NSTEMI) myocardial infarction: Secondary | ICD-10-CM | POA: Diagnosis not present

## 2020-09-12 DIAGNOSIS — R509 Fever, unspecified: Secondary | ICD-10-CM | POA: Diagnosis not present

## 2020-09-12 DIAGNOSIS — E119 Type 2 diabetes mellitus without complications: Secondary | ICD-10-CM | POA: Diagnosis not present

## 2020-09-12 DIAGNOSIS — R570 Cardiogenic shock: Secondary | ICD-10-CM | POA: Diagnosis not present

## 2020-09-12 DIAGNOSIS — E785 Hyperlipidemia, unspecified: Secondary | ICD-10-CM | POA: Diagnosis not present

## 2020-09-12 DIAGNOSIS — Z4682 Encounter for fitting and adjustment of non-vascular catheter: Secondary | ICD-10-CM | POA: Diagnosis not present

## 2020-09-12 DIAGNOSIS — I10 Essential (primary) hypertension: Secondary | ICD-10-CM | POA: Diagnosis not present

## 2020-09-12 DIAGNOSIS — A419 Sepsis, unspecified organism: Secondary | ICD-10-CM | POA: Diagnosis not present

## 2020-09-12 DIAGNOSIS — Z20822 Contact with and (suspected) exposure to covid-19: Secondary | ICD-10-CM | POA: Diagnosis not present

## 2020-09-12 DIAGNOSIS — I723 Aneurysm of iliac artery: Secondary | ICD-10-CM | POA: Diagnosis not present

## 2020-09-12 DIAGNOSIS — S61431D Puncture wound without foreign body of right hand, subsequent encounter: Secondary | ICD-10-CM | POA: Diagnosis not present

## 2020-09-12 DIAGNOSIS — S61431A Puncture wound without foreign body of right hand, initial encounter: Secondary | ICD-10-CM | POA: Diagnosis not present

## 2020-09-12 DIAGNOSIS — Y9389 Activity, other specified: Secondary | ICD-10-CM | POA: Diagnosis not present

## 2020-09-12 DIAGNOSIS — I5023 Acute on chronic systolic (congestive) heart failure: Secondary | ICD-10-CM | POA: Diagnosis not present

## 2020-09-12 DIAGNOSIS — R69 Illness, unspecified: Secondary | ICD-10-CM | POA: Diagnosis not present

## 2020-09-12 DIAGNOSIS — R001 Bradycardia, unspecified: Secondary | ICD-10-CM | POA: Diagnosis not present

## 2020-09-12 DIAGNOSIS — I110 Hypertensive heart disease with heart failure: Secondary | ICD-10-CM | POA: Diagnosis not present

## 2020-09-12 DIAGNOSIS — N170 Acute kidney failure with tubular necrosis: Secondary | ICD-10-CM | POA: Diagnosis not present

## 2020-09-12 DIAGNOSIS — I959 Hypotension, unspecified: Secondary | ICD-10-CM | POA: Diagnosis not present

## 2020-09-12 DIAGNOSIS — I213 ST elevation (STEMI) myocardial infarction of unspecified site: Secondary | ICD-10-CM | POA: Diagnosis not present

## 2020-09-12 DIAGNOSIS — I5020 Unspecified systolic (congestive) heart failure: Secondary | ICD-10-CM | POA: Diagnosis not present

## 2020-09-12 DIAGNOSIS — I712 Thoracic aortic aneurysm, without rupture: Secondary | ICD-10-CM | POA: Diagnosis not present

## 2020-09-12 DIAGNOSIS — N179 Acute kidney failure, unspecified: Secondary | ICD-10-CM | POA: Diagnosis not present

## 2020-09-12 DIAGNOSIS — R079 Chest pain, unspecified: Secondary | ICD-10-CM | POA: Diagnosis not present

## 2020-09-12 DIAGNOSIS — E872 Acidosis: Secondary | ICD-10-CM | POA: Diagnosis not present

## 2020-09-12 DIAGNOSIS — I340 Nonrheumatic mitral (valve) insufficiency: Secondary | ICD-10-CM | POA: Diagnosis not present

## 2020-09-12 DIAGNOSIS — N189 Chronic kidney disease, unspecified: Secondary | ICD-10-CM | POA: Diagnosis not present

## 2020-09-12 DIAGNOSIS — M109 Gout, unspecified: Secondary | ICD-10-CM | POA: Diagnosis not present

## 2020-09-12 DIAGNOSIS — I517 Cardiomegaly: Secondary | ICD-10-CM | POA: Diagnosis not present

## 2020-09-12 DIAGNOSIS — R4182 Altered mental status, unspecified: Secondary | ICD-10-CM | POA: Diagnosis not present

## 2020-09-12 DIAGNOSIS — I2510 Atherosclerotic heart disease of native coronary artery without angina pectoris: Secondary | ICD-10-CM | POA: Diagnosis not present

## 2020-09-15 DIAGNOSIS — S61431D Puncture wound without foreign body of right hand, subsequent encounter: Secondary | ICD-10-CM | POA: Diagnosis not present

## 2020-09-16 DIAGNOSIS — S61431D Puncture wound without foreign body of right hand, subsequent encounter: Secondary | ICD-10-CM | POA: Diagnosis not present

## 2020-09-16 DIAGNOSIS — S61431A Puncture wound without foreign body of right hand, initial encounter: Secondary | ICD-10-CM | POA: Diagnosis not present

## 2020-09-16 DIAGNOSIS — N189 Chronic kidney disease, unspecified: Secondary | ICD-10-CM | POA: Diagnosis not present

## 2020-09-16 DIAGNOSIS — R69 Illness, unspecified: Secondary | ICD-10-CM | POA: Diagnosis not present

## 2020-09-16 DIAGNOSIS — D62 Acute posthemorrhagic anemia: Secondary | ICD-10-CM | POA: Diagnosis not present

## 2020-09-17 DIAGNOSIS — R4182 Altered mental status, unspecified: Secondary | ICD-10-CM | POA: Diagnosis not present

## 2020-09-17 DIAGNOSIS — Z452 Encounter for adjustment and management of vascular access device: Secondary | ICD-10-CM | POA: Diagnosis not present

## 2020-09-18 DIAGNOSIS — N179 Acute kidney failure, unspecified: Secondary | ICD-10-CM | POA: Diagnosis not present

## 2020-09-18 DIAGNOSIS — R079 Chest pain, unspecified: Secondary | ICD-10-CM | POA: Diagnosis not present

## 2020-09-18 DIAGNOSIS — I249 Acute ischemic heart disease, unspecified: Secondary | ICD-10-CM | POA: Diagnosis not present

## 2020-09-18 DIAGNOSIS — S61431D Puncture wound without foreign body of right hand, subsequent encounter: Secondary | ICD-10-CM | POA: Diagnosis not present

## 2020-09-18 DIAGNOSIS — R570 Cardiogenic shock: Secondary | ICD-10-CM | POA: Diagnosis not present

## 2020-09-18 DIAGNOSIS — R0603 Acute respiratory distress: Secondary | ICD-10-CM | POA: Diagnosis not present

## 2020-09-18 DIAGNOSIS — Z4682 Encounter for fitting and adjustment of non-vascular catheter: Secondary | ICD-10-CM | POA: Diagnosis not present

## 2020-09-18 DIAGNOSIS — I340 Nonrheumatic mitral (valve) insufficiency: Secondary | ICD-10-CM | POA: Diagnosis not present

## 2020-09-18 DIAGNOSIS — J811 Chronic pulmonary edema: Secondary | ICD-10-CM | POA: Diagnosis not present

## 2020-09-18 DIAGNOSIS — J9601 Acute respiratory failure with hypoxia: Secondary | ICD-10-CM | POA: Diagnosis not present

## 2020-09-18 DIAGNOSIS — I517 Cardiomegaly: Secondary | ICD-10-CM | POA: Diagnosis not present

## 2020-09-18 DIAGNOSIS — I214 Non-ST elevation (NSTEMI) myocardial infarction: Secondary | ICD-10-CM | POA: Diagnosis not present

## 2020-09-18 DIAGNOSIS — Z452 Encounter for adjustment and management of vascular access device: Secondary | ICD-10-CM | POA: Diagnosis not present

## 2020-09-18 DIAGNOSIS — I5020 Unspecified systolic (congestive) heart failure: Secondary | ICD-10-CM | POA: Diagnosis not present

## 2020-09-19 DIAGNOSIS — I214 Non-ST elevation (NSTEMI) myocardial infarction: Secondary | ICD-10-CM | POA: Insufficient documentation

## 2020-09-19 DIAGNOSIS — Z452 Encounter for adjustment and management of vascular access device: Secondary | ICD-10-CM | POA: Diagnosis not present

## 2020-09-19 DIAGNOSIS — S61431D Puncture wound without foreign body of right hand, subsequent encounter: Secondary | ICD-10-CM | POA: Diagnosis not present

## 2020-09-20 DIAGNOSIS — S61431D Puncture wound without foreign body of right hand, subsequent encounter: Secondary | ICD-10-CM | POA: Diagnosis not present

## 2020-09-20 DIAGNOSIS — I2510 Atherosclerotic heart disease of native coronary artery without angina pectoris: Secondary | ICD-10-CM | POA: Diagnosis not present

## 2020-09-20 DIAGNOSIS — N179 Acute kidney failure, unspecified: Secondary | ICD-10-CM | POA: Diagnosis not present

## 2020-09-20 DIAGNOSIS — R001 Bradycardia, unspecified: Secondary | ICD-10-CM | POA: Diagnosis not present

## 2020-09-20 DIAGNOSIS — I441 Atrioventricular block, second degree: Secondary | ICD-10-CM | POA: Diagnosis not present

## 2020-09-20 DIAGNOSIS — I214 Non-ST elevation (NSTEMI) myocardial infarction: Secondary | ICD-10-CM | POA: Diagnosis not present

## 2020-09-20 DIAGNOSIS — Z452 Encounter for adjustment and management of vascular access device: Secondary | ICD-10-CM | POA: Diagnosis not present

## 2020-09-21 DIAGNOSIS — R509 Fever, unspecified: Secondary | ICD-10-CM | POA: Diagnosis not present

## 2020-09-21 DIAGNOSIS — I214 Non-ST elevation (NSTEMI) myocardial infarction: Secondary | ICD-10-CM | POA: Diagnosis not present

## 2020-09-21 DIAGNOSIS — N179 Acute kidney failure, unspecified: Secondary | ICD-10-CM | POA: Diagnosis not present

## 2020-09-21 DIAGNOSIS — R001 Bradycardia, unspecified: Secondary | ICD-10-CM | POA: Diagnosis not present

## 2020-09-21 DIAGNOSIS — I5020 Unspecified systolic (congestive) heart failure: Secondary | ICD-10-CM | POA: Diagnosis not present

## 2020-09-21 DIAGNOSIS — I441 Atrioventricular block, second degree: Secondary | ICD-10-CM | POA: Diagnosis not present

## 2020-09-22 DIAGNOSIS — D649 Anemia, unspecified: Secondary | ICD-10-CM | POA: Diagnosis not present

## 2020-09-22 DIAGNOSIS — I5020 Unspecified systolic (congestive) heart failure: Secondary | ICD-10-CM | POA: Diagnosis not present

## 2020-09-22 DIAGNOSIS — N179 Acute kidney failure, unspecified: Secondary | ICD-10-CM | POA: Diagnosis not present

## 2020-09-22 DIAGNOSIS — R509 Fever, unspecified: Secondary | ICD-10-CM | POA: Diagnosis not present

## 2020-09-22 DIAGNOSIS — I214 Non-ST elevation (NSTEMI) myocardial infarction: Secondary | ICD-10-CM | POA: Diagnosis not present

## 2020-09-22 DIAGNOSIS — I441 Atrioventricular block, second degree: Secondary | ICD-10-CM | POA: Diagnosis not present

## 2020-09-23 DIAGNOSIS — R059 Cough, unspecified: Secondary | ICD-10-CM | POA: Diagnosis not present

## 2020-09-23 DIAGNOSIS — I5020 Unspecified systolic (congestive) heart failure: Secondary | ICD-10-CM | POA: Diagnosis not present

## 2020-09-23 DIAGNOSIS — R509 Fever, unspecified: Secondary | ICD-10-CM | POA: Diagnosis not present

## 2020-09-23 DIAGNOSIS — I214 Non-ST elevation (NSTEMI) myocardial infarction: Secondary | ICD-10-CM | POA: Diagnosis not present

## 2020-09-23 DIAGNOSIS — N179 Acute kidney failure, unspecified: Secondary | ICD-10-CM | POA: Diagnosis not present

## 2020-09-23 DIAGNOSIS — I441 Atrioventricular block, second degree: Secondary | ICD-10-CM | POA: Diagnosis not present

## 2020-09-23 DIAGNOSIS — F17200 Nicotine dependence, unspecified, uncomplicated: Secondary | ICD-10-CM | POA: Insufficient documentation

## 2020-09-24 DIAGNOSIS — I214 Non-ST elevation (NSTEMI) myocardial infarction: Secondary | ICD-10-CM | POA: Diagnosis not present

## 2020-09-24 DIAGNOSIS — R509 Fever, unspecified: Secondary | ICD-10-CM | POA: Diagnosis not present

## 2020-09-24 DIAGNOSIS — N179 Acute kidney failure, unspecified: Secondary | ICD-10-CM | POA: Diagnosis not present

## 2020-09-24 DIAGNOSIS — I441 Atrioventricular block, second degree: Secondary | ICD-10-CM | POA: Diagnosis not present

## 2020-09-24 DIAGNOSIS — I5020 Unspecified systolic (congestive) heart failure: Secondary | ICD-10-CM | POA: Diagnosis not present

## 2020-09-25 DIAGNOSIS — I441 Atrioventricular block, second degree: Secondary | ICD-10-CM | POA: Diagnosis not present

## 2020-09-25 DIAGNOSIS — I5020 Unspecified systolic (congestive) heart failure: Secondary | ICD-10-CM | POA: Diagnosis not present

## 2020-09-25 DIAGNOSIS — I214 Non-ST elevation (NSTEMI) myocardial infarction: Secondary | ICD-10-CM | POA: Diagnosis not present

## 2020-09-25 DIAGNOSIS — R001 Bradycardia, unspecified: Secondary | ICD-10-CM | POA: Diagnosis not present

## 2020-09-26 DIAGNOSIS — I5023 Acute on chronic systolic (congestive) heart failure: Secondary | ICD-10-CM | POA: Diagnosis not present

## 2020-09-26 DIAGNOSIS — R001 Bradycardia, unspecified: Secondary | ICD-10-CM | POA: Diagnosis not present

## 2020-09-26 DIAGNOSIS — R531 Weakness: Secondary | ICD-10-CM | POA: Diagnosis not present

## 2020-09-26 DIAGNOSIS — I214 Non-ST elevation (NSTEMI) myocardial infarction: Secondary | ICD-10-CM | POA: Diagnosis not present

## 2020-09-26 DIAGNOSIS — I255 Ischemic cardiomyopathy: Secondary | ICD-10-CM | POA: Diagnosis not present

## 2020-09-26 DIAGNOSIS — I441 Atrioventricular block, second degree: Secondary | ICD-10-CM | POA: Diagnosis not present

## 2020-09-26 DIAGNOSIS — Z7409 Other reduced mobility: Secondary | ICD-10-CM | POA: Diagnosis not present

## 2020-10-01 DIAGNOSIS — I214 Non-ST elevation (NSTEMI) myocardial infarction: Secondary | ICD-10-CM | POA: Diagnosis not present

## 2020-10-01 DIAGNOSIS — D649 Anemia, unspecified: Secondary | ICD-10-CM | POA: Diagnosis not present

## 2020-10-01 DIAGNOSIS — J449 Chronic obstructive pulmonary disease, unspecified: Secondary | ICD-10-CM | POA: Diagnosis not present

## 2020-10-01 DIAGNOSIS — I10 Essential (primary) hypertension: Secondary | ICD-10-CM | POA: Diagnosis not present

## 2020-10-01 DIAGNOSIS — I2510 Atherosclerotic heart disease of native coronary artery without angina pectoris: Secondary | ICD-10-CM | POA: Diagnosis not present

## 2020-10-01 DIAGNOSIS — I5023 Acute on chronic systolic (congestive) heart failure: Secondary | ICD-10-CM | POA: Diagnosis not present

## 2020-10-01 DIAGNOSIS — I255 Ischemic cardiomyopathy: Secondary | ICD-10-CM | POA: Diagnosis not present

## 2020-10-01 DIAGNOSIS — N170 Acute kidney failure with tubular necrosis: Secondary | ICD-10-CM | POA: Diagnosis not present

## 2020-10-01 DIAGNOSIS — Z48298 Encounter for aftercare following other organ transplant: Secondary | ICD-10-CM | POA: Diagnosis not present

## 2020-10-01 DIAGNOSIS — I110 Hypertensive heart disease with heart failure: Secondary | ICD-10-CM | POA: Diagnosis not present

## 2020-10-01 DIAGNOSIS — A419 Sepsis, unspecified organism: Secondary | ICD-10-CM | POA: Diagnosis not present

## 2020-10-01 DIAGNOSIS — I252 Old myocardial infarction: Secondary | ICD-10-CM | POA: Diagnosis not present

## 2020-10-02 ENCOUNTER — Ambulatory Visit (HOSPITAL_COMMUNITY): Admission: RE | Admit: 2020-10-02 | Payer: Medicare HMO | Source: Ambulatory Visit

## 2020-10-02 DIAGNOSIS — I5043 Acute on chronic combined systolic (congestive) and diastolic (congestive) heart failure: Secondary | ICD-10-CM | POA: Diagnosis not present

## 2020-10-02 DIAGNOSIS — J449 Chronic obstructive pulmonary disease, unspecified: Secondary | ICD-10-CM | POA: Diagnosis not present

## 2020-10-02 DIAGNOSIS — N179 Acute kidney failure, unspecified: Secondary | ICD-10-CM | POA: Diagnosis not present

## 2020-10-02 DIAGNOSIS — I959 Hypotension, unspecified: Secondary | ICD-10-CM | POA: Diagnosis not present

## 2020-10-02 DIAGNOSIS — W3400XA Accidental discharge from unspecified firearms or gun, initial encounter: Secondary | ICD-10-CM | POA: Diagnosis not present

## 2020-10-02 DIAGNOSIS — I255 Ischemic cardiomyopathy: Secondary | ICD-10-CM | POA: Diagnosis not present

## 2020-10-02 DIAGNOSIS — I10 Essential (primary) hypertension: Secondary | ICD-10-CM | POA: Diagnosis not present

## 2020-10-02 DIAGNOSIS — S6982XA Other specified injuries of left wrist, hand and finger(s), initial encounter: Secondary | ICD-10-CM | POA: Diagnosis not present

## 2020-10-03 DIAGNOSIS — A419 Sepsis, unspecified organism: Secondary | ICD-10-CM | POA: Diagnosis not present

## 2020-10-03 DIAGNOSIS — J449 Chronic obstructive pulmonary disease, unspecified: Secondary | ICD-10-CM | POA: Diagnosis not present

## 2020-10-03 DIAGNOSIS — I5023 Acute on chronic systolic (congestive) heart failure: Secondary | ICD-10-CM | POA: Diagnosis not present

## 2020-10-03 DIAGNOSIS — I255 Ischemic cardiomyopathy: Secondary | ICD-10-CM | POA: Diagnosis not present

## 2020-10-03 DIAGNOSIS — I214 Non-ST elevation (NSTEMI) myocardial infarction: Secondary | ICD-10-CM | POA: Diagnosis not present

## 2020-10-03 DIAGNOSIS — D649 Anemia, unspecified: Secondary | ICD-10-CM | POA: Diagnosis not present

## 2020-10-03 DIAGNOSIS — N170 Acute kidney failure with tubular necrosis: Secondary | ICD-10-CM | POA: Diagnosis not present

## 2020-10-03 DIAGNOSIS — Z48298 Encounter for aftercare following other organ transplant: Secondary | ICD-10-CM | POA: Diagnosis not present

## 2020-10-03 DIAGNOSIS — I2510 Atherosclerotic heart disease of native coronary artery without angina pectoris: Secondary | ICD-10-CM | POA: Diagnosis not present

## 2020-10-03 DIAGNOSIS — I110 Hypertensive heart disease with heart failure: Secondary | ICD-10-CM | POA: Diagnosis not present

## 2020-10-06 DIAGNOSIS — I214 Non-ST elevation (NSTEMI) myocardial infarction: Secondary | ICD-10-CM | POA: Diagnosis not present

## 2020-10-06 DIAGNOSIS — N170 Acute kidney failure with tubular necrosis: Secondary | ICD-10-CM | POA: Diagnosis not present

## 2020-10-06 DIAGNOSIS — I110 Hypertensive heart disease with heart failure: Secondary | ICD-10-CM | POA: Diagnosis not present

## 2020-10-06 DIAGNOSIS — D649 Anemia, unspecified: Secondary | ICD-10-CM | POA: Diagnosis not present

## 2020-10-06 DIAGNOSIS — J449 Chronic obstructive pulmonary disease, unspecified: Secondary | ICD-10-CM | POA: Diagnosis not present

## 2020-10-06 DIAGNOSIS — Z48298 Encounter for aftercare following other organ transplant: Secondary | ICD-10-CM | POA: Diagnosis not present

## 2020-10-06 DIAGNOSIS — I2510 Atherosclerotic heart disease of native coronary artery without angina pectoris: Secondary | ICD-10-CM | POA: Diagnosis not present

## 2020-10-06 DIAGNOSIS — I255 Ischemic cardiomyopathy: Secondary | ICD-10-CM | POA: Diagnosis not present

## 2020-10-06 DIAGNOSIS — I5023 Acute on chronic systolic (congestive) heart failure: Secondary | ICD-10-CM | POA: Diagnosis not present

## 2020-10-06 DIAGNOSIS — A419 Sepsis, unspecified organism: Secondary | ICD-10-CM | POA: Diagnosis not present

## 2020-10-07 DIAGNOSIS — I5023 Acute on chronic systolic (congestive) heart failure: Secondary | ICD-10-CM | POA: Diagnosis not present

## 2020-10-07 DIAGNOSIS — A419 Sepsis, unspecified organism: Secondary | ICD-10-CM | POA: Diagnosis not present

## 2020-10-07 DIAGNOSIS — I255 Ischemic cardiomyopathy: Secondary | ICD-10-CM | POA: Diagnosis not present

## 2020-10-07 DIAGNOSIS — J449 Chronic obstructive pulmonary disease, unspecified: Secondary | ICD-10-CM | POA: Diagnosis not present

## 2020-10-07 DIAGNOSIS — I110 Hypertensive heart disease with heart failure: Secondary | ICD-10-CM | POA: Diagnosis not present

## 2020-10-07 DIAGNOSIS — D649 Anemia, unspecified: Secondary | ICD-10-CM | POA: Diagnosis not present

## 2020-10-07 DIAGNOSIS — I2510 Atherosclerotic heart disease of native coronary artery without angina pectoris: Secondary | ICD-10-CM | POA: Diagnosis not present

## 2020-10-07 DIAGNOSIS — N170 Acute kidney failure with tubular necrosis: Secondary | ICD-10-CM | POA: Diagnosis not present

## 2020-10-07 DIAGNOSIS — Z48298 Encounter for aftercare following other organ transplant: Secondary | ICD-10-CM | POA: Diagnosis not present

## 2020-10-07 DIAGNOSIS — I214 Non-ST elevation (NSTEMI) myocardial infarction: Secondary | ICD-10-CM | POA: Diagnosis not present

## 2020-10-08 ENCOUNTER — Ambulatory Visit: Payer: Medicare HMO | Admitting: Vascular Surgery

## 2020-10-08 DIAGNOSIS — Z48298 Encounter for aftercare following other organ transplant: Secondary | ICD-10-CM | POA: Diagnosis not present

## 2020-10-08 DIAGNOSIS — I2510 Atherosclerotic heart disease of native coronary artery without angina pectoris: Secondary | ICD-10-CM | POA: Diagnosis not present

## 2020-10-08 DIAGNOSIS — A419 Sepsis, unspecified organism: Secondary | ICD-10-CM | POA: Diagnosis not present

## 2020-10-08 DIAGNOSIS — I5023 Acute on chronic systolic (congestive) heart failure: Secondary | ICD-10-CM | POA: Diagnosis not present

## 2020-10-08 DIAGNOSIS — I255 Ischemic cardiomyopathy: Secondary | ICD-10-CM | POA: Diagnosis not present

## 2020-10-08 DIAGNOSIS — I110 Hypertensive heart disease with heart failure: Secondary | ICD-10-CM | POA: Diagnosis not present

## 2020-10-08 DIAGNOSIS — I214 Non-ST elevation (NSTEMI) myocardial infarction: Secondary | ICD-10-CM | POA: Diagnosis not present

## 2020-10-08 DIAGNOSIS — D649 Anemia, unspecified: Secondary | ICD-10-CM | POA: Diagnosis not present

## 2020-10-08 DIAGNOSIS — J449 Chronic obstructive pulmonary disease, unspecified: Secondary | ICD-10-CM | POA: Diagnosis not present

## 2020-10-08 DIAGNOSIS — N170 Acute kidney failure with tubular necrosis: Secondary | ICD-10-CM | POA: Diagnosis not present
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
## 2020-10-14 ENCOUNTER — Encounter: Payer: Self-pay | Admitting: Family Medicine

## 2020-10-14 ENCOUNTER — Encounter: Payer: Self-pay | Admitting: *Deleted

## 2020-10-14 ENCOUNTER — Other Ambulatory Visit: Payer: Self-pay

## 2020-10-14 ENCOUNTER — Ambulatory Visit: Payer: Medicare HMO | Admitting: Family Medicine

## 2020-10-14 VITALS — BP 92/50 | HR 79 | Ht 66.0 in | Wt 207.8 lb

## 2020-10-14 DIAGNOSIS — Z9889 Other specified postprocedural states: Secondary | ICD-10-CM

## 2020-10-14 DIAGNOSIS — I5020 Unspecified systolic (congestive) heart failure: Secondary | ICD-10-CM | POA: Diagnosis not present

## 2020-10-14 DIAGNOSIS — N170 Acute kidney failure with tubular necrosis: Secondary | ICD-10-CM | POA: Diagnosis not present

## 2020-10-14 DIAGNOSIS — I110 Hypertensive heart disease with heart failure: Secondary | ICD-10-CM | POA: Diagnosis not present

## 2020-10-14 DIAGNOSIS — I255 Ischemic cardiomyopathy: Secondary | ICD-10-CM | POA: Diagnosis not present

## 2020-10-14 DIAGNOSIS — E782 Mixed hyperlipidemia: Secondary | ICD-10-CM | POA: Diagnosis not present

## 2020-10-14 DIAGNOSIS — A419 Sepsis, unspecified organism: Secondary | ICD-10-CM | POA: Diagnosis not present

## 2020-10-14 DIAGNOSIS — I5023 Acute on chronic systolic (congestive) heart failure: Secondary | ICD-10-CM | POA: Diagnosis not present

## 2020-10-14 DIAGNOSIS — I214 Non-ST elevation (NSTEMI) myocardial infarction: Secondary | ICD-10-CM

## 2020-10-14 DIAGNOSIS — I10 Essential (primary) hypertension: Secondary | ICD-10-CM | POA: Diagnosis not present

## 2020-10-14 DIAGNOSIS — D649 Anemia, unspecified: Secondary | ICD-10-CM | POA: Diagnosis not present

## 2020-10-14 DIAGNOSIS — J449 Chronic obstructive pulmonary disease, unspecified: Secondary | ICD-10-CM | POA: Diagnosis not present

## 2020-10-14 DIAGNOSIS — Z48298 Encounter for aftercare following other organ transplant: Secondary | ICD-10-CM | POA: Diagnosis not present

## 2020-10-14 DIAGNOSIS — I2510 Atherosclerotic heart disease of native coronary artery without angina pectoris: Secondary | ICD-10-CM | POA: Diagnosis not present

## 2020-10-14 MED ORDER — SPIRONOLACTONE 25 MG PO TABS: 12.5000 mg | ORAL_TABLET | Freq: Every day | ORAL | 3 refills | Status: DC

## 2020-10-14 NOTE — Patient Instructions (Addendum)
Medication Instructions:  Your physician has recommended you make the following change in your medication:  Decrease spironolactone to 12.5 mg daily Continue other medications the same  Labwork: BMET & Mg in 1 week at [facility] or [employer] [facility] [facility]  Testing/Procedures: none  Follow-Up: Your physician recommends that you schedule a follow-up appointment in: 1 month  Any Other Special Instructions Will Be Listed Below (If Applicable).  If you need a refill on your cardiac medications before your next appointment, please call your pharmacy.

## 2020-10-15 ENCOUNTER — Ambulatory Visit: Payer: Medicare HMO | Admitting: Vascular Surgery

## 2020-10-15 ENCOUNTER — Encounter: Payer: Self-pay | Admitting: Family Medicine

## 2020-10-16 DIAGNOSIS — X58XXXD Exposure to other specified factors, subsequent encounter: Secondary | ICD-10-CM | POA: Diagnosis not present

## 2020-10-16 DIAGNOSIS — S61431D Puncture wound without foreign body of right hand, subsequent encounter: Secondary | ICD-10-CM | POA: Diagnosis not present

## 2020-10-16 DIAGNOSIS — M79641 Pain in right hand: Secondary | ICD-10-CM | POA: Diagnosis not present

## 2020-10-16 DIAGNOSIS — S61431S Puncture wound without foreign body of right hand, sequela: Secondary | ICD-10-CM | POA: Diagnosis not present

## 2020-10-17 DIAGNOSIS — Z48298 Encounter for aftercare following other organ transplant: Secondary | ICD-10-CM | POA: Diagnosis not present

## 2020-10-17 DIAGNOSIS — I5023 Acute on chronic systolic (congestive) heart failure: Secondary | ICD-10-CM | POA: Diagnosis not present

## 2020-10-17 DIAGNOSIS — A419 Sepsis, unspecified organism: Secondary | ICD-10-CM | POA: Diagnosis not present

## 2020-10-17 DIAGNOSIS — I2510 Atherosclerotic heart disease of native coronary artery without angina pectoris: Secondary | ICD-10-CM | POA: Diagnosis not present

## 2020-10-17 DIAGNOSIS — N170 Acute kidney failure with tubular necrosis: Secondary | ICD-10-CM | POA: Diagnosis not present

## 2020-10-17 DIAGNOSIS — J449 Chronic obstructive pulmonary disease, unspecified: Secondary | ICD-10-CM | POA: Diagnosis not present

## 2020-10-17 DIAGNOSIS — I110 Hypertensive heart disease with heart failure: Secondary | ICD-10-CM | POA: Diagnosis not present

## 2020-10-17 DIAGNOSIS — I255 Ischemic cardiomyopathy: Secondary | ICD-10-CM | POA: Diagnosis not present

## 2020-10-17 DIAGNOSIS — I214 Non-ST elevation (NSTEMI) myocardial infarction: Secondary | ICD-10-CM | POA: Diagnosis not present

## 2020-10-17 DIAGNOSIS — D649 Anemia, unspecified: Secondary | ICD-10-CM | POA: Diagnosis not present

## 2020-10-21 ENCOUNTER — Telehealth: Payer: Self-pay | Admitting: Family Medicine

## 2020-10-21 DIAGNOSIS — I10 Essential (primary) hypertension: Secondary | ICD-10-CM

## 2020-10-21 DIAGNOSIS — Z79899 Other long term (current) drug therapy: Secondary | ICD-10-CM

## 2020-10-21 MED ORDER — TORSEMIDE 20 MG PO TABS: 20.0000 mg | ORAL_TABLET | Freq: Every day | ORAL | 6 refills | Status: DC

## 2020-10-21 NOTE — Telephone Encounter (Signed)
Patient informed and verbalized understanding of plan. 

## 2020-10-21 NOTE — Telephone Encounter (Signed)
Reports more swelling in feet and legs since fluid medication decreased Reports eating more  Weight today was 210 lbs Yesterday weight 212 lbs

## 2020-10-21 NOTE — Telephone Encounter (Signed)
New Message    Patient wife called she would like to speak with Jonni Sanger about the lab work, Mr Meng PCP put in a urgent referral to [facility] said it was urgent that he goes.  Since you reduced his medication he is having some swelling in his feet and he is having weight gain 210-212 yesterday , he is fluctuating 3-5 lbs .

## 2020-10-21 NOTE — Telephone Encounter (Signed)
-----   Message from Merlene Laughter, RN sent at 10/21/2020  8:22 AM EDT -----  ----- Message ----- From: Verta Ellen., NP Sent: 10/20/2020   3:01 PM EDT To: Laurine Blazer, LPN  Please call the patient and tell him to stop sprinolocatone for now and  decrease Torsemide to 20 mg po daily. Recent Labwork shows increased stress on his kidneys. Will need to get a follow up BMET and MG in 2 weeks to recheck.   Verta Ellen, NP  10/20/2020 3:00 PM

## 2020-10-22 DIAGNOSIS — N170 Acute kidney failure with tubular necrosis: Secondary | ICD-10-CM | POA: Diagnosis not present

## 2020-10-22 DIAGNOSIS — I110 Hypertensive heart disease with heart failure: Secondary | ICD-10-CM | POA: Diagnosis not present

## 2020-10-22 DIAGNOSIS — Z48298 Encounter for aftercare following other organ transplant: Secondary | ICD-10-CM | POA: Diagnosis not present

## 2020-10-22 DIAGNOSIS — I214 Non-ST elevation (NSTEMI) myocardial infarction: Secondary | ICD-10-CM | POA: Diagnosis not present

## 2020-10-22 DIAGNOSIS — J449 Chronic obstructive pulmonary disease, unspecified: Secondary | ICD-10-CM | POA: Diagnosis not present

## 2020-10-22 DIAGNOSIS — I255 Ischemic cardiomyopathy: Secondary | ICD-10-CM | POA: Diagnosis not present

## 2020-10-22 DIAGNOSIS — I2510 Atherosclerotic heart disease of native coronary artery without angina pectoris: Secondary | ICD-10-CM | POA: Diagnosis not present

## 2020-10-22 DIAGNOSIS — A419 Sepsis, unspecified organism: Secondary | ICD-10-CM | POA: Diagnosis not present

## 2020-10-22 DIAGNOSIS — I5023 Acute on chronic systolic (congestive) heart failure: Secondary | ICD-10-CM | POA: Diagnosis not present

## 2020-10-22 DIAGNOSIS — D649 Anemia, unspecified: Secondary | ICD-10-CM | POA: Diagnosis not present

## 2020-10-23 ENCOUNTER — Other Ambulatory Visit: Payer: Self-pay | Admitting: Internal Medicine

## 2020-10-23 DIAGNOSIS — A419 Sepsis, unspecified organism: Secondary | ICD-10-CM | POA: Diagnosis not present

## 2020-10-23 DIAGNOSIS — N170 Acute kidney failure with tubular necrosis: Secondary | ICD-10-CM | POA: Diagnosis not present

## 2020-10-23 DIAGNOSIS — I255 Ischemic cardiomyopathy: Secondary | ICD-10-CM | POA: Diagnosis not present

## 2020-10-23 DIAGNOSIS — I2510 Atherosclerotic heart disease of native coronary artery without angina pectoris: Secondary | ICD-10-CM | POA: Diagnosis not present

## 2020-10-23 DIAGNOSIS — I214 Non-ST elevation (NSTEMI) myocardial infarction: Secondary | ICD-10-CM | POA: Diagnosis not present

## 2020-10-23 DIAGNOSIS — M1A9XX1 Chronic gout, unspecified, with tophus (tophi): Secondary | ICD-10-CM

## 2020-10-23 DIAGNOSIS — I5023 Acute on chronic systolic (congestive) heart failure: Secondary | ICD-10-CM | POA: Diagnosis not present

## 2020-10-23 DIAGNOSIS — D649 Anemia, unspecified: Secondary | ICD-10-CM | POA: Diagnosis not present

## 2020-10-23 DIAGNOSIS — Z48298 Encounter for aftercare following other organ transplant: Secondary | ICD-10-CM | POA: Diagnosis not present

## 2020-10-23 DIAGNOSIS — I110 Hypertensive heart disease with heart failure: Secondary | ICD-10-CM | POA: Diagnosis not present

## 2020-10-23 DIAGNOSIS — J449 Chronic obstructive pulmonary disease, unspecified: Secondary | ICD-10-CM | POA: Diagnosis not present

## 2020-10-28 ENCOUNTER — Other Ambulatory Visit: Payer: Self-pay

## 2020-10-28 ENCOUNTER — Other Ambulatory Visit (HOSPITAL_COMMUNITY)
Admission: RE | Admit: 2020-10-28 | Discharge: 2020-10-28 | Disposition: A | Discharge status: Home / Self Care | Payer: Medicare HMO | Source: Ambulatory Visit | Attending: Family Medicine | Admitting: Family Medicine

## 2020-10-28 DIAGNOSIS — I5020 Unspecified systolic (congestive) heart failure: Secondary | ICD-10-CM | POA: Diagnosis not present

## 2020-10-28 DIAGNOSIS — I10 Essential (primary) hypertension: Secondary | ICD-10-CM | POA: Diagnosis not present

## 2020-10-28 DIAGNOSIS — Z9889 Other specified postprocedural states: Secondary | ICD-10-CM | POA: Insufficient documentation

## 2020-10-28 DIAGNOSIS — I214 Non-ST elevation (NSTEMI) myocardial infarction: Secondary | ICD-10-CM | POA: Diagnosis not present

## 2020-10-28 DIAGNOSIS — E782 Mixed hyperlipidemia: Secondary | ICD-10-CM | POA: Insufficient documentation

## 2020-10-28 LAB — BASIC METABOLIC PANEL
Anion gap: 12 (ref 5–15)
BUN: 29 mg/dL — ABNORMAL HIGH (ref 8–23)
CO2: 25 mmol/L (ref 22–32)
Calcium: 9.1 mg/dL (ref 8.9–10.3)
Chloride: 105 mmol/L (ref 98–111)
Creatinine, Ser: 1.06 mg/dL (ref 0.61–1.24)
GFR, Estimated: >60 mL/min (ref >60)
Glucose, Bld: 117 mg/dL — ABNORMAL HIGH (ref 70–99)
Potassium: 3.9 mmol/L (ref 3.5–5.1)
Sodium: 142 mmol/L (ref 135–145)

## 2020-10-28 LAB — MAGNESIUM: Magnesium: 1.9 mg/dL (ref 1.7–2.4)

## 2020-10-29 ENCOUNTER — Telehealth: Payer: Self-pay | Admitting: *Deleted

## 2020-10-29 DIAGNOSIS — I255 Ischemic cardiomyopathy: Secondary | ICD-10-CM | POA: Diagnosis not present

## 2020-10-29 DIAGNOSIS — A419 Sepsis, unspecified organism: Secondary | ICD-10-CM | POA: Diagnosis not present

## 2020-10-29 DIAGNOSIS — Z48298 Encounter for aftercare following other organ transplant: Secondary | ICD-10-CM | POA: Diagnosis not present

## 2020-10-29 DIAGNOSIS — I214 Non-ST elevation (NSTEMI) myocardial infarction: Secondary | ICD-10-CM | POA: Diagnosis not present

## 2020-10-29 DIAGNOSIS — D649 Anemia, unspecified: Secondary | ICD-10-CM | POA: Diagnosis not present

## 2020-10-29 DIAGNOSIS — I110 Hypertensive heart disease with heart failure: Secondary | ICD-10-CM | POA: Diagnosis not present

## 2020-10-29 DIAGNOSIS — I5023 Acute on chronic systolic (congestive) heart failure: Secondary | ICD-10-CM | POA: Diagnosis not present

## 2020-10-29 DIAGNOSIS — J449 Chronic obstructive pulmonary disease, unspecified: Secondary | ICD-10-CM | POA: Diagnosis not present

## 2020-10-29 DIAGNOSIS — N170 Acute kidney failure with tubular necrosis: Secondary | ICD-10-CM | POA: Diagnosis not present

## 2020-10-29 DIAGNOSIS — I2510 Atherosclerotic heart disease of native coronary artery without angina pectoris: Secondary | ICD-10-CM | POA: Diagnosis not present

## 2020-10-29 NOTE — Telephone Encounter (Signed)
Laurine Blazer, LPN  [id number]  [id number] PM EDT Back to Top    Notified, copy to pcp.

## 2020-10-29 NOTE — Telephone Encounter (Signed)
-----   Message from Verta Ellen., NP sent at 10/28/2020 11:26 AM EDT ----- Basic metabolic panel looks good.  Blood sugar is a little elevated at 117.  Kidney function looks good.  Magnesium looks good.   Verta Ellen, NP  10/28/2020 11:25 AM

## 2020-11-05 DIAGNOSIS — D649 Anemia, unspecified: Secondary | ICD-10-CM | POA: Diagnosis not present

## 2020-11-05 DIAGNOSIS — I110 Hypertensive heart disease with heart failure: Secondary | ICD-10-CM | POA: Diagnosis not present

## 2020-11-05 DIAGNOSIS — Z48298 Encounter for aftercare following other organ transplant: Secondary | ICD-10-CM | POA: Diagnosis not present

## 2020-11-05 DIAGNOSIS — N170 Acute kidney failure with tubular necrosis: Secondary | ICD-10-CM | POA: Diagnosis not present

## 2020-11-05 DIAGNOSIS — A419 Sepsis, unspecified organism: Secondary | ICD-10-CM | POA: Diagnosis not present

## 2020-11-05 DIAGNOSIS — I2510 Atherosclerotic heart disease of native coronary artery without angina pectoris: Secondary | ICD-10-CM | POA: Diagnosis not present

## 2020-11-05 DIAGNOSIS — I214 Non-ST elevation (NSTEMI) myocardial infarction: Secondary | ICD-10-CM | POA: Diagnosis not present

## 2020-11-05 DIAGNOSIS — I5023 Acute on chronic systolic (congestive) heart failure: Secondary | ICD-10-CM | POA: Diagnosis not present

## 2020-11-05 DIAGNOSIS — J449 Chronic obstructive pulmonary disease, unspecified: Secondary | ICD-10-CM | POA: Diagnosis not present

## 2020-11-05 DIAGNOSIS — I255 Ischemic cardiomyopathy: Secondary | ICD-10-CM | POA: Diagnosis not present

## 2020-11-10 DIAGNOSIS — I129 Hypertensive chronic kidney disease with stage 1 through stage 4 chronic kidney disease, or unspecified chronic kidney disease: Secondary | ICD-10-CM | POA: Diagnosis not present

## 2020-11-10 DIAGNOSIS — W3400XD Accidental discharge from unspecified firearms or gun, subsequent encounter: Secondary | ICD-10-CM | POA: Diagnosis not present

## 2020-11-10 DIAGNOSIS — N179 Acute kidney failure, unspecified: Secondary | ICD-10-CM | POA: Diagnosis not present

## 2020-11-10 DIAGNOSIS — I5042 Chronic combined systolic (congestive) and diastolic (congestive) heart failure: Secondary | ICD-10-CM | POA: Diagnosis not present
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
## 2020-11-11 ENCOUNTER — Encounter: Payer: Self-pay | Admitting: Family Medicine

## 2020-11-11 ENCOUNTER — Ambulatory Visit: Payer: Medicare HMO | Admitting: Family Medicine

## 2020-11-11 VITALS — BP 120/70 | HR 36 | Ht 66.0 in | Wt 222.0 lb

## 2020-11-11 DIAGNOSIS — J449 Chronic obstructive pulmonary disease, unspecified: Secondary | ICD-10-CM | POA: Diagnosis not present

## 2020-11-11 DIAGNOSIS — E782 Mixed hyperlipidemia: Secondary | ICD-10-CM

## 2020-11-11 DIAGNOSIS — I442 Atrioventricular block, complete: Secondary | ICD-10-CM

## 2020-11-11 DIAGNOSIS — I5020 Unspecified systolic (congestive) heart failure: Secondary | ICD-10-CM

## 2020-11-11 DIAGNOSIS — I252 Old myocardial infarction: Secondary | ICD-10-CM | POA: Diagnosis not present

## 2020-11-11 DIAGNOSIS — I255 Ischemic cardiomyopathy: Secondary | ICD-10-CM | POA: Diagnosis not present

## 2020-11-11 DIAGNOSIS — I214 Non-ST elevation (NSTEMI) myocardial infarction: Secondary | ICD-10-CM | POA: Diagnosis not present

## 2020-11-11 DIAGNOSIS — I10 Essential (primary) hypertension: Secondary | ICD-10-CM | POA: Diagnosis not present

## 2020-11-11 DIAGNOSIS — I110 Hypertensive heart disease with heart failure: Secondary | ICD-10-CM | POA: Diagnosis not present

## 2020-11-11 DIAGNOSIS — N170 Acute kidney failure with tubular necrosis: Secondary | ICD-10-CM | POA: Diagnosis not present

## 2020-11-11 DIAGNOSIS — D649 Anemia, unspecified: Secondary | ICD-10-CM | POA: Diagnosis not present

## 2020-11-11 DIAGNOSIS — I2510 Atherosclerotic heart disease of native coronary artery without angina pectoris: Secondary | ICD-10-CM | POA: Diagnosis not present

## 2020-11-11 DIAGNOSIS — I5023 Acute on chronic systolic (congestive) heart failure: Secondary | ICD-10-CM | POA: Diagnosis not present

## 2020-11-11 DIAGNOSIS — A419 Sepsis, unspecified organism: Secondary | ICD-10-CM | POA: Diagnosis not present

## 2020-11-11 DIAGNOSIS — Z48298 Encounter for aftercare following other organ transplant: Secondary | ICD-10-CM | POA: Diagnosis not present

## 2020-11-11 NOTE — Patient Instructions (Addendum)
Medication Instructions:  Your physician has recommended you make the following change in your medication:  Stop metoprolol succinate (toprol xl) Continue other medications the same  Labwork: none  Testing/Procedures: none  Follow-Up: Your physician recommends that you schedule a follow-up appointment in: 2 weeks for nurse visit for EKG Follow up with Dr. Harl Bowie in December as planned  Any Other Special Instructions Will Be Listed Below (If Applicable). You have been referred to an Electrophyisiologist (EP) Cardiologist.  If you need a refill on your cardiac medications before your next appointment, please call your pharmacy.

## 2020-11-13 ENCOUNTER — Ambulatory Visit (INDEPENDENT_AMBULATORY_CARE_PROVIDER_SITE_OTHER): Payer: Medicare HMO | Admitting: *Deleted

## 2020-11-13 DIAGNOSIS — I442 Atrioventricular block, complete: Secondary | ICD-10-CM | POA: Diagnosis not present

## 2020-11-13 NOTE — Progress Notes (Signed)
Patient in office for EKG - will be scanned into epic for review.    Patient originally scheduled for 12/23/2020 with Dr. Lovena Le in [place] office - will see if we can get him seen sooner if possible.

## 2020-11-15 ENCOUNTER — Other Ambulatory Visit: Payer: Self-pay | Admitting: Internal Medicine

## 2020-11-15 DIAGNOSIS — M1A9XX1 Chronic gout, unspecified, with tophus (tophi): Secondary | ICD-10-CM

## 2020-11-19 DIAGNOSIS — Z48298 Encounter for aftercare following other organ transplant: Secondary | ICD-10-CM | POA: Diagnosis not present

## 2020-11-19 DIAGNOSIS — N170 Acute kidney failure with tubular necrosis: Secondary | ICD-10-CM | POA: Diagnosis not present

## 2020-11-19 DIAGNOSIS — I2510 Atherosclerotic heart disease of native coronary artery without angina pectoris: Secondary | ICD-10-CM | POA: Diagnosis not present

## 2020-11-19 DIAGNOSIS — J449 Chronic obstructive pulmonary disease, unspecified: Secondary | ICD-10-CM | POA: Diagnosis not present

## 2020-11-19 DIAGNOSIS — I255 Ischemic cardiomyopathy: Secondary | ICD-10-CM | POA: Diagnosis not present

## 2020-11-19 DIAGNOSIS — D649 Anemia, unspecified: Secondary | ICD-10-CM | POA: Diagnosis not present

## 2020-11-19 DIAGNOSIS — I110 Hypertensive heart disease with heart failure: Secondary | ICD-10-CM | POA: Diagnosis not present

## 2020-11-19 DIAGNOSIS — I214 Non-ST elevation (NSTEMI) myocardial infarction: Secondary | ICD-10-CM | POA: Diagnosis not present

## 2020-11-19 DIAGNOSIS — I5023 Acute on chronic systolic (congestive) heart failure: Secondary | ICD-10-CM | POA: Diagnosis not present

## 2020-11-19 DIAGNOSIS — A419 Sepsis, unspecified organism: Secondary | ICD-10-CM | POA: Diagnosis not present

## 2020-11-21 ENCOUNTER — Other Ambulatory Visit: Payer: Self-pay

## 2020-11-21 ENCOUNTER — Encounter: Payer: Self-pay | Admitting: Internal Medicine

## 2020-11-21 ENCOUNTER — Ambulatory Visit: Payer: Medicare HMO | Admitting: Internal Medicine

## 2020-11-21 DIAGNOSIS — I442 Atrioventricular block, complete: Secondary | ICD-10-CM

## 2020-11-21 NOTE — Patient Instructions (Signed)
Medication Instructions:  Your physician recommends that you continue on your current medications as directed. Please refer to the Current Medication list given to you today.  Labwork: You will get lab work today:  BMP and CBC  Testing/Procedures: None ordered.  Follow-Up:  SEE INSTRUCTION LETTER  Any Other Special Instructions Will Be Listed Below (If Applicable).  If you need a refill on your cardiac medications before your next appointment, please call your pharmacy.   Pacemaker Implantation, Adult Pacemaker implantation is a procedure to place a pacemaker inside the chest. A pacemaker is a small computer that sends electrical signals to the heart and helps the heart beat normally. A pacemaker also stores information about heart rhythms. You may need pacemaker implantation if you have: A slow heartbeat (bradycardia). Loss of consciousness that happens repeatedly (syncope) or repeated episodes of dizziness or light-headedness because of an irregular heart rate. Shortness of breath (dyspnea) due to heart problems. The pacemaker usually attaches to your heart through a wire called a lead. One or two leads may be needed. There are different types of pacemakers: Transvenous pacemaker. This type is placed under the skin or muscle of your upper chest area. The lead goes through a vein in the chest area to reach the inside of the heart. Epicardial pacemaker. This type is placed under the skin or muscle of your chest or abdomen. The lead goes through your chest to the outside of the heart. Tell a health care provider about: Any allergies you have. All medicines you are taking, including vitamins, herbs, eye drops, creams, and over-the-counter medicines. Any problems you or family members have had with anesthetic medicines. Any blood or bone disorders you have. Any surgeries you have had. Any medical conditions you have. Whether you are pregnant or may be pregnant. What are the  risks? Generally, this is a safe procedure. However, problems may occur, including: Infection. Bleeding. Failure of the pacemaker or the lead. Collapse of a lung or bleeding into a lung. Blood clot inside a blood vessel with a lead. Damage to the heart. Infection inside the heart (endocarditis). Allergic reactions to medicines. What happens before the procedure? Staying hydrated Follow instructions from your health care provider about hydration, which may include: Up to 2 hours before the procedure - you may continue to drink clear liquids, such as water, clear fruit juice, black coffee, and plain tea.  Eating and drinking restrictions Follow instructions from your health care provider about eating and drinking, which may include: 8 hours before the procedure - stop eating heavy meals or foods, such as meat, fried foods, or fatty foods. 6 hours before the procedure - stop eating light meals or foods, such as toast or cereal. 6 hours before the procedure - stop drinking milk or drinks that contain milk. 2 hours before the procedure - stop drinking clear liquids. Medicines Ask your health care provider about: Changing or stopping your regular medicines. This is especially important if you are taking diabetes medicines or blood thinners. Taking medicines such as aspirin and ibuprofen. These medicines can thin your blood. Do not take these medicines unless your health care provider tells you to take them. Taking over-the-counter medicines, vitamins, herbs, and supplements. Tests You may have: A heart evaluation. This may include: An electrocardiogram (ECG). This involves placing patches on your skin to check your heart rhythm. A chest X-ray. An echocardiogram. This is a test that uses sound waves (ultrasound) to produce an image of the heart. A cardiac rhythm monitor.  This is used to record your heart rhythm and any events for a longer period of time. Blood tests. Genetic  testing. General instructions Do not use any products that contain nicotine or tobacco for at least 4 weeks before the procedure. These products include cigarettes, e-cigarettes, and chewing tobacco. If you need help quitting, ask your health care provider. Ask your health care provider: How your surgery site will be marked. What steps will be taken to help prevent infection. These steps may include: Removing hair at the surgery site. Washing skin with a germ-killing soap. Receiving antibiotic medicine. Plan to have someone take you home from the hospital or clinic. If you will be going home right after the procedure, plan to have someone with you for 24 hours. What happens during the procedure? An IV will be inserted into one of your veins. You will be given one or more of the following: A medicine to help you relax (sedative). A medicine to numb the area (local anesthetic). A medicine to make you fall asleep (general anesthetic). The next steps vary depending on the type of pacemaker you will be getting. If you are getting a transvenous pacemaker: An incision will be made in your upper chest. A pocket will be made for the pacemaker. It may be placed under the skin or between layers of muscle. The lead will be inserted into a blood vessel that goes to the heart. While X-rays are taken by an imaging machine (fluoroscopy), the lead will be advanced through the vein to the inside of your heart. The other end of the lead will be tunneled under the skin and attached to the pacemaker. If you are getting an epicardial pacemaker: An incision will be made near your ribs or breastbone (sternum) for the lead. The lead will be attached to the outside of your heart. Another incision will be made in your chest or upper abdomen to create a pocket for the pacemaker. The free end of the lead will be tunneled under the skin and attached to the pacemaker. The transvenous or epicardial pacemaker will be  tested. Imaging studies may be done to check the lead position. The incisions will be closed with stitches (sutures), adhesive strips, or skin glue. Bandages (dressings) will be placed over the incisions. The procedure may vary among health care providers and hospitals. What happens after the procedure? Your blood pressure, heart rate, breathing rate, and blood oxygen level will be monitored until you leave the hospital or clinic. You may be given antibiotics. You will be given pain medicine. An ECG and chest X-rays will be done. You may need to wear a continuous type of ECG (Holter monitor) to check your heart rhythm. Your health care provider will program the pacemaker. If you were given a sedative during the procedure, it can affect you for several hours. Do not drive or operate machinery until your health care provider says that it is safe. You will be given a pacemaker identification card. This card lists the implant date, device model, and manufacturer of your pacemaker. Summary A pacemaker is a small computer that sends electrical signals to the heart and helps the heart beat normally. There are different types of pacemakers. A pacemaker may be placed under the skin or muscle of your chest or abdomen. Follow instructions from your health care provider about eating and drinking and about taking medicines before the procedure. This information is not intended to replace advice given to you by your health care provider. Make  sure you discuss any questions you have with your health care provider. Document Revised: 01/03/2019 Document Reviewed: 01/03/2019 Elsevier Patient Education  2022 [employer].
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
## 2020-11-22 LAB — CBC WITH DIFFERENTIAL/PLATELET
Basophils Absolute: 0 10*3/uL (ref 0.0–0.2)
Basos: 1 %
EOS (ABSOLUTE): 0.2 10*3/uL (ref 0.0–0.4)
Eos: 3 %
Hematocrit: 37.2 % — ABNORMAL LOW (ref 37.5–51.0)
Hemoglobin: 12.4 g/dL — ABNORMAL LOW (ref 13.0–17.7)
Immature Grans (Abs): 0 10*3/uL (ref 0.0–0.1)
Immature Granulocytes: 0 %
Lymphocytes Absolute: 2 10*3/uL (ref 0.7–3.1)
Lymphs: 26 %
MCH: 30.9 pg (ref 26.6–33.0)
MCHC: 33.3 g/dL (ref 31.5–35.7)
MCV: 93 fL (ref 79–97)
Monocytes Absolute: 0.8 10*3/uL (ref 0.1–0.9)
Monocytes: 10 %
Neutrophils Absolute: 4.7 10*3/uL (ref 1.4–7.0)
Neutrophils: 60 %
Platelets: 251 10*3/uL (ref 150–450)
RBC: 4.01 x10E6/uL — ABNORMAL LOW (ref 4.14–5.80)
RDW: 14.7 % (ref 11.6–15.4)
WBC: 7.8 10*3/uL (ref 3.4–10.8)

## 2020-11-22 LAB — BASIC METABOLIC PANEL
BUN/Creatinine Ratio: 17 (ref 10–24)
BUN: 22 mg/dL (ref 8–27)
CO2: 23 mmol/L (ref 20–29)
Calcium: 9.2 mg/dL (ref 8.6–10.2)
Chloride: 109 mmol/L — ABNORMAL HIGH (ref 96–106)
Creatinine, Ser: 1.28 mg/dL — ABNORMAL HIGH (ref 0.76–1.27)
Glucose: 107 mg/dL — ABNORMAL HIGH (ref 70–99)
Potassium: 3.5 mmol/L (ref 3.5–5.2)
Sodium: 149 mmol/L — ABNORMAL HIGH (ref 134–144)
eGFR: 58 mL/min/{1.73_m2} — ABNORMAL LOW (ref >59)

## 2020-11-25 DIAGNOSIS — I2510 Atherosclerotic heart disease of native coronary artery without angina pectoris: Secondary | ICD-10-CM | POA: Diagnosis not present

## 2020-11-25 DIAGNOSIS — Z48298 Encounter for aftercare following other organ transplant: Secondary | ICD-10-CM | POA: Diagnosis not present

## 2020-11-25 DIAGNOSIS — N170 Acute kidney failure with tubular necrosis: Secondary | ICD-10-CM | POA: Diagnosis not present

## 2020-11-25 DIAGNOSIS — I214 Non-ST elevation (NSTEMI) myocardial infarction: Secondary | ICD-10-CM | POA: Diagnosis not present

## 2020-11-25 DIAGNOSIS — A419 Sepsis, unspecified organism: Secondary | ICD-10-CM | POA: Diagnosis not present

## 2020-11-25 DIAGNOSIS — J449 Chronic obstructive pulmonary disease, unspecified: Secondary | ICD-10-CM | POA: Diagnosis not present

## 2020-11-25 DIAGNOSIS — I255 Ischemic cardiomyopathy: Secondary | ICD-10-CM | POA: Diagnosis not present

## 2020-11-25 DIAGNOSIS — I110 Hypertensive heart disease with heart failure: Secondary | ICD-10-CM | POA: Diagnosis not present

## 2020-11-25 DIAGNOSIS — I5023 Acute on chronic systolic (congestive) heart failure: Secondary | ICD-10-CM | POA: Diagnosis not present

## 2020-11-25 DIAGNOSIS — D649 Anemia, unspecified: Secondary | ICD-10-CM | POA: Diagnosis not present

## 2020-11-27 ENCOUNTER — Encounter (HOSPITAL_COMMUNITY): Payer: Self-pay | Admitting: Internal Medicine

## 2020-11-27 ENCOUNTER — Other Ambulatory Visit: Payer: Self-pay

## 2020-11-27 ENCOUNTER — Emergency Department (HOSPITAL_COMMUNITY): Payer: Medicare HMO

## 2020-11-27 ENCOUNTER — Inpatient Hospital Stay (HOSPITAL_COMMUNITY)
Admission: EM | Admit: 2020-11-27 | Discharge: 2020-11-29 | DRG: 243 | Disposition: A | Discharge status: Home / Self Care | Payer: Medicare HMO | Attending: Internal Medicine | Admitting: Internal Medicine

## 2020-11-27 DIAGNOSIS — I252 Old myocardial infarction: Secondary | ICD-10-CM | POA: Diagnosis not present

## 2020-11-27 DIAGNOSIS — Z743 Need for continuous supervision: Secondary | ICD-10-CM | POA: Diagnosis not present

## 2020-11-27 DIAGNOSIS — R9431 Abnormal electrocardiogram [ECG] [EKG]: Secondary | ICD-10-CM | POA: Diagnosis not present

## 2020-11-27 DIAGNOSIS — I255 Ischemic cardiomyopathy: Secondary | ICD-10-CM | POA: Diagnosis not present

## 2020-11-27 DIAGNOSIS — R55 Syncope and collapse: Secondary | ICD-10-CM | POA: Diagnosis not present

## 2020-11-27 DIAGNOSIS — Z9861 Coronary angioplasty status: Secondary | ICD-10-CM | POA: Diagnosis not present

## 2020-11-27 DIAGNOSIS — J439 Emphysema, unspecified: Secondary | ICD-10-CM | POA: Diagnosis not present

## 2020-11-27 DIAGNOSIS — Z6835 Body mass index (BMI) 35.0-35.9, adult: Secondary | ICD-10-CM | POA: Diagnosis not present

## 2020-11-27 DIAGNOSIS — Z20822 Contact with and (suspected) exposure to covid-19: Secondary | ICD-10-CM | POA: Diagnosis present

## 2020-11-27 DIAGNOSIS — Z7902 Long term (current) use of antithrombotics/antiplatelets: Secondary | ICD-10-CM

## 2020-11-27 DIAGNOSIS — I110 Hypertensive heart disease with heart failure: Secondary | ICD-10-CM | POA: Diagnosis present

## 2020-11-27 DIAGNOSIS — I5043 Acute on chronic combined systolic (congestive) and diastolic (congestive) heart failure: Secondary | ICD-10-CM

## 2020-11-27 DIAGNOSIS — I5022 Chronic systolic (congestive) heart failure: Secondary | ICD-10-CM | POA: Diagnosis present

## 2020-11-27 DIAGNOSIS — R570 Cardiogenic shock: Secondary | ICD-10-CM | POA: Diagnosis not present

## 2020-11-27 DIAGNOSIS — E876 Hypokalemia: Secondary | ICD-10-CM | POA: Diagnosis not present

## 2020-11-27 DIAGNOSIS — M48061 Spinal stenosis, lumbar region without neurogenic claudication: Secondary | ICD-10-CM | POA: Diagnosis present

## 2020-11-27 DIAGNOSIS — E785 Hyperlipidemia, unspecified: Secondary | ICD-10-CM | POA: Diagnosis present

## 2020-11-27 DIAGNOSIS — G4733 Obstructive sleep apnea (adult) (pediatric): Secondary | ICD-10-CM | POA: Diagnosis not present

## 2020-11-27 DIAGNOSIS — I499 Cardiac arrhythmia, unspecified: Secondary | ICD-10-CM | POA: Diagnosis not present

## 2020-11-27 DIAGNOSIS — I25118 Atherosclerotic heart disease of native coronary artery with other forms of angina pectoris: Secondary | ICD-10-CM | POA: Diagnosis not present

## 2020-11-27 DIAGNOSIS — I2510 Atherosclerotic heart disease of native coronary artery without angina pectoris: Secondary | ICD-10-CM | POA: Diagnosis not present

## 2020-11-27 DIAGNOSIS — M109 Gout, unspecified: Secondary | ICD-10-CM | POA: Diagnosis present

## 2020-11-27 DIAGNOSIS — I959 Hypotension, unspecified: Secondary | ICD-10-CM | POA: Diagnosis not present

## 2020-11-27 DIAGNOSIS — Z9852 Vasectomy status: Secondary | ICD-10-CM | POA: Diagnosis not present

## 2020-11-27 DIAGNOSIS — N62 Hypertrophy of breast: Secondary | ICD-10-CM | POA: Diagnosis present

## 2020-11-27 DIAGNOSIS — K5790 Diverticulosis of intestine, part unspecified, without perforation or abscess without bleeding: Secondary | ICD-10-CM | POA: Diagnosis present

## 2020-11-27 DIAGNOSIS — Z8249 Family history of ischemic heart disease and other diseases of the circulatory system: Secondary | ICD-10-CM

## 2020-11-27 DIAGNOSIS — Z7982 Long term (current) use of aspirin: Secondary | ICD-10-CM | POA: Diagnosis not present

## 2020-11-27 DIAGNOSIS — Z8679 Personal history of other diseases of the circulatory system: Secondary | ICD-10-CM

## 2020-11-27 DIAGNOSIS — Z79899 Other long term (current) drug therapy: Secondary | ICD-10-CM | POA: Diagnosis not present

## 2020-11-27 DIAGNOSIS — I442 Atrioventricular block, complete: Secondary | ICD-10-CM | POA: Diagnosis not present

## 2020-11-27 DIAGNOSIS — Z87891 Personal history of nicotine dependence: Secondary | ICD-10-CM

## 2020-11-27 DIAGNOSIS — Z95818 Presence of other cardiac implants and grafts: Secondary | ICD-10-CM

## 2020-11-27 DIAGNOSIS — R0689 Other abnormalities of breathing: Secondary | ICD-10-CM | POA: Diagnosis not present

## 2020-11-27 LAB — BASIC METABOLIC PANEL
Anion gap: 10 (ref 5–15)
BUN: 28 mg/dL — ABNORMAL HIGH (ref 8–23)
CO2: 25 mmol/L (ref 22–32)
Calcium: 8.5 mg/dL — ABNORMAL LOW (ref 8.9–10.3)
Chloride: 105 mmol/L (ref 98–111)
Creatinine, Ser: 1.34 mg/dL — ABNORMAL HIGH (ref 0.61–1.24)
GFR, Estimated: 55 mL/min — ABNORMAL LOW (ref >60)
Glucose, Bld: 119 mg/dL — ABNORMAL HIGH (ref 70–99)
Potassium: 3.5 mmol/L (ref 3.5–5.1)
Sodium: 140 mmol/L (ref 135–145)

## 2020-11-27 LAB — MAGNESIUM: Magnesium: 1.9 mg/dL (ref 1.7–2.4)

## 2020-11-27 LAB — CBC
HCT: 37 % — ABNORMAL LOW (ref 39.0–52.0)
Hemoglobin: 11.8 g/dL — ABNORMAL LOW (ref 13.0–17.0)
MCH: 31.4 pg (ref 26.0–34.0)
MCHC: 31.9 g/dL (ref 30.0–36.0)
MCV: 98.4 fL (ref 80.0–100.0)
Platelets: 197 10*3/uL (ref 150–400)
RBC: 3.76 MIL/uL — ABNORMAL LOW (ref 4.22–5.81)
RDW: 16.1 % — ABNORMAL HIGH (ref 11.5–15.5)
WBC: 8.3 10*3/uL (ref 4.0–10.5)
nRBC: 0 % (ref 0.0–0.2)

## 2020-11-27 LAB — GLUCOSE, CAPILLARY: Glucose-Capillary: 116 mg/dL — ABNORMAL HIGH (ref 70–99)

## 2020-11-27 LAB — RESP PANEL BY RT-PCR (FLU A&B, COVID) ARPGX2
Influenza A by PCR: NEGATIVE
Influenza B by PCR: NEGATIVE
SARS Coronavirus 2 by RT PCR: NEGATIVE

## 2020-11-27 LAB — BRAIN NATRIURETIC PEPTIDE: B Natriuretic Peptide: 1193.3 pg/mL — ABNORMAL HIGH (ref 0.0–100.0)

## 2020-11-27 MED ORDER — ENOXAPARIN SODIUM 40 MG/0.4ML IJ SOSY
40.0000 mg | PREFILLED_SYRINGE | Freq: Every day | INTRAMUSCULAR | Status: DC
  Administered 2020-11-27: 40 mg via SUBCUTANEOUS
  Filled 2020-11-27: qty 0.4

## 2020-11-27 MED ORDER — ATORVASTATIN CALCIUM 80 MG PO TABS
80.0000 mg | ORAL_TABLET | Freq: Every day | ORAL | Status: DC
  Administered 2020-11-27 – 2020-11-28 (×2): 80 mg via ORAL
  Filled 2020-11-27: qty 2
  Filled 2020-11-27: qty 1

## 2020-11-27 MED ORDER — ALPRAZOLAM 0.25 MG PO TABS: 0.2500 mg | ORAL_TABLET | Freq: Two times a day (BID) | ORAL | Status: DC | PRN

## 2020-11-27 MED ORDER — SPIRONOLACTONE 25 MG PO TABS
25.0000 mg | ORAL_TABLET | Freq: Every day | ORAL | Status: DC
  Administered 2020-11-28 – 2020-11-29 (×2): 25 mg via ORAL
  Filled 2020-11-27 (×2): qty 1

## 2020-11-27 MED ORDER — ALBUTEROL SULFATE (2.5 MG/3ML) 0.083% IN NEBU: 2.5000 mg | INHALATION_SOLUTION | Freq: Four times a day (QID) | RESPIRATORY_TRACT | Status: DC | PRN

## 2020-11-27 MED ORDER — TORSEMIDE 20 MG PO TABS
60.0000 mg | ORAL_TABLET | Freq: Every day | ORAL | Status: DC
  Administered 2020-11-28: 60 mg via ORAL
  Filled 2020-11-27 (×2): qty 3

## 2020-11-27 MED ORDER — ZOLPIDEM TARTRATE 5 MG PO TABS
5.0000 mg | ORAL_TABLET | Freq: Every evening | ORAL | Status: DC | PRN
  Administered 2020-11-28: 5 mg via ORAL
  Filled 2020-11-27: qty 1

## 2020-11-27 MED ORDER — SODIUM CHLORIDE 0.9 % IV SOLN
250.0000 mL | INTRAVENOUS | Status: DC | PRN
Start: 2020-11-27 — End: 2020-11-29
  Administered 2020-11-29: 250 mL via INTRAVENOUS

## 2020-11-27 MED ORDER — SODIUM CHLORIDE 0.9% FLUSH: 3.0000 mL | INTRAVENOUS | Status: DC | PRN

## 2020-11-27 MED ORDER — ASPIRIN EC 81 MG PO TBEC
81.0000 mg | DELAYED_RELEASE_TABLET | Freq: Every day | ORAL | Status: DC
  Administered 2020-11-28 – 2020-11-29 (×2): 81 mg via ORAL
  Filled 2020-11-27 (×2): qty 1

## 2020-11-27 MED ORDER — VITAMIN D 25 MCG (1000 UNIT) PO TABS
5000.0000 [IU] | ORAL_TABLET | Freq: Every day | ORAL | Status: DC
  Administered 2020-11-28 – 2020-11-29 (×2): 5000 [IU] via ORAL
  Filled 2020-11-27 (×2): qty 5

## 2020-11-27 MED ORDER — ALLOPURINOL 300 MG PO TABS
450.0000 mg | ORAL_TABLET | Freq: Every day | ORAL | Status: DC
  Administered 2020-11-28 – 2020-11-29 (×2): 450 mg via ORAL
  Filled 2020-11-27 (×2): qty 2

## 2020-11-27 MED ORDER — ACETAMINOPHEN 325 MG PO TABS
650.0000 mg | ORAL_TABLET | ORAL | Status: DC | PRN
Start: 2020-11-27 — End: 2020-11-29
  Administered 2020-11-28 – 2020-11-29 (×3): 650 mg via ORAL
  Filled 2020-11-27 (×3): qty 2

## 2020-11-27 MED ORDER — VITAMIN B-12 1000 MCG PO TABS
5000.0000 ug | ORAL_TABLET | Freq: Every day | ORAL | Status: DC
  Administered 2020-11-28 – 2020-11-29 (×2): 5000 ug via ORAL
  Filled 2020-11-27 (×2): qty 5

## 2020-11-27 MED ORDER — CHLORHEXIDINE GLUCONATE CLOTH 2 % EX PADS
6.0000 | MEDICATED_PAD | Freq: Every day | CUTANEOUS | Status: DC
  Administered 2020-11-28: 6 via TOPICAL

## 2020-11-27 MED ORDER — NITROGLYCERIN 0.4 MG SL SUBL: 0.4000 mg | SUBLINGUAL_TABLET | SUBLINGUAL | Status: DC | PRN

## 2020-11-27 MED ORDER — MUPIROCIN 2 % EX OINT
1.0000 "application " | TOPICAL_OINTMENT | Freq: Two times a day (BID) | CUTANEOUS | Status: DC
  Administered 2020-11-28 – 2020-11-29 (×3): 1 via NASAL
  Filled 2020-11-27: qty 22

## 2020-11-27 MED ORDER — ONDANSETRON HCL 4 MG/2ML IJ SOLN: 4.0000 mg | Freq: Four times a day (QID) | INTRAMUSCULAR | Status: DC | PRN

## 2020-11-27 MED ORDER — SACUBITRIL-VALSARTAN 24-26 MG PO TABS
1.0000 | ORAL_TABLET | Freq: Two times a day (BID) | ORAL | Status: DC
  Administered 2020-11-27: 1 via ORAL
  Filled 2020-11-27 (×2): qty 1

## 2020-11-27 MED ORDER — SODIUM CHLORIDE 0.9% FLUSH
3.0000 mL | Freq: Two times a day (BID) | INTRAVENOUS | Status: DC
  Administered 2020-11-27 – 2020-11-29 (×2): 3 mL via INTRAVENOUS
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
## 2020-11-28 ENCOUNTER — Encounter (HOSPITAL_COMMUNITY)
Admission: EM | Disposition: A | Discharge status: Home / Self Care | Payer: Self-pay | Source: Home / Self Care | Attending: Internal Medicine

## 2020-11-28 ENCOUNTER — Inpatient Hospital Stay (HOSPITAL_COMMUNITY): Payer: Medicare HMO

## 2020-11-28 DIAGNOSIS — R9431 Abnormal electrocardiogram [ECG] [EKG]: Secondary | ICD-10-CM

## 2020-11-28 HISTORY — PX: PACEMAKER IMPLANT: EP1218

## 2020-11-28 LAB — SURGICAL PCR SCREEN
MRSA, PCR: NEGATIVE
Staphylococcus aureus: NEGATIVE

## 2020-11-28 LAB — COMPREHENSIVE METABOLIC PANEL
ALT: 13 U/L (ref 0–44)
AST: 16 U/L (ref 15–41)
Albumin: 3.2 g/dL — ABNORMAL LOW (ref 3.5–5.0)
Alkaline Phosphatase: 107 U/L (ref 38–126)
Anion gap: 11 (ref 5–15)
BUN: 23 mg/dL (ref 8–23)
CO2: 23 mmol/L (ref 22–32)
Calcium: 8.7 mg/dL — ABNORMAL LOW (ref 8.9–10.3)
Chloride: 107 mmol/L (ref 98–111)
Creatinine, Ser: 1.3 mg/dL — ABNORMAL HIGH (ref 0.61–1.24)
GFR, Estimated: 57 mL/min — ABNORMAL LOW (ref >60)
Glucose, Bld: 110 mg/dL — ABNORMAL HIGH (ref 70–99)
Potassium: 3 mmol/L — ABNORMAL LOW (ref 3.5–5.1)
Sodium: 141 mmol/L (ref 135–145)
Total Bilirubin: 0.8 mg/dL (ref 0.3–1.2)
Total Protein: 5.6 g/dL — ABNORMAL LOW (ref 6.5–8.1)

## 2020-11-28 LAB — ECHOCARDIOGRAM COMPLETE
Calc EF: 51.2 %
Height: 66 in
Radius: 0.5 cm
S' Lateral: 4.3 cm
Single Plane A2C EF: 49.6 %
Single Plane A4C EF: 53.1 %
Weight: 3562.63 oz

## 2020-11-28 LAB — TSH: TSH: 2.076 u[IU]/mL (ref 0.350–4.500)

## 2020-11-28 LAB — MAGNESIUM: Magnesium: 1.7 mg/dL (ref 1.7–2.4)

## 2020-11-28 SURGERY — PACEMAKER IMPLANT

## 2020-11-28 MED ORDER — CEFAZOLIN SODIUM-DEXTROSE 2-4 GM/100ML-% IV SOLN
2.0000 g | INTRAVENOUS | Status: AC
Start: 2020-11-28 — End: 2020-11-28
  Administered 2020-11-28: 2 g via INTRAVENOUS
  Filled 2020-11-28: qty 100

## 2020-11-28 MED ORDER — SODIUM CHLORIDE 0.9% FLUSH: 3.0000 mL | INTRAVENOUS | Status: DC | PRN

## 2020-11-28 MED ORDER — CEFAZOLIN SODIUM-DEXTROSE 2-4 GM/100ML-% IV SOLN
INTRAVENOUS | Status: AC
  Filled 2020-11-28: qty 100

## 2020-11-28 MED ORDER — SODIUM CHLORIDE 0.9% FLUSH
3.0000 mL | Freq: Two times a day (BID) | INTRAVENOUS | Status: DC
  Administered 2020-11-28 (×2): 3 mL via INTRAVENOUS

## 2020-11-28 MED ORDER — SODIUM CHLORIDE 0.9 % IV SOLN: 250.0000 mL | INTRAVENOUS | Status: DC

## 2020-11-28 MED ORDER — PERFLUTREN LIPID MICROSPHERE
1.0000 mL | INTRAVENOUS | Status: AC | PRN
  Administered 2020-11-28: 2 mL via INTRAVENOUS
  Filled 2020-11-28: qty 10

## 2020-11-28 MED ORDER — SODIUM CHLORIDE 0.9 % IV SOLN: INTRAVENOUS | Status: DC

## 2020-11-28 MED ORDER — HEPARIN (PORCINE) IN NACL 1000-0.9 UT/500ML-% IV SOLN
INTRAVENOUS | Status: DC | PRN
  Administered 2020-11-28 (×2): 500 mL

## 2020-11-28 MED ORDER — SACUBITRIL-VALSARTAN 24-26 MG PO TABS
1.0000 | ORAL_TABLET | Freq: Two times a day (BID) | ORAL | Status: DC
  Administered 2020-11-28 – 2020-11-29 (×2): 1 via ORAL
  Filled 2020-11-28 (×3): qty 1

## 2020-11-28 MED ORDER — SODIUM CHLORIDE 0.9 % IV SOLN
80.0000 mg | INTRAVENOUS | Status: AC
  Administered 2020-11-28: 80 mg
  Filled 2020-11-28: qty 2

## 2020-11-28 MED ORDER — SODIUM CHLORIDE 0.9 % IV SOLN
INTRAVENOUS | Status: AC
  Filled 2020-11-28: qty 2

## 2020-11-28 MED ORDER — CHLORHEXIDINE GLUCONATE 4 % EX LIQD
60.0000 mL | Freq: Once | CUTANEOUS | Status: AC
Start: 2020-11-28 — End: 2020-11-28

## 2020-11-28 MED ORDER — POTASSIUM CHLORIDE CRYS ER 20 MEQ PO TBCR: 60.0000 meq | EXTENDED_RELEASE_TABLET | Freq: Two times a day (BID) | ORAL | Status: DC

## 2020-11-28 MED ORDER — POTASSIUM CHLORIDE CRYS ER 20 MEQ PO TBCR
60.0000 meq | EXTENDED_RELEASE_TABLET | Freq: Once | ORAL | Status: AC
  Administered 2020-11-28: 60 meq via ORAL
  Filled 2020-11-28: qty 3

## 2020-11-28 MED ORDER — CHLORHEXIDINE GLUCONATE 4 % EX LIQD
60.0000 mL | Freq: Once | CUTANEOUS | Status: AC
Start: 2020-11-28 — End: 2020-11-28
  Administered 2020-11-28: 4 via TOPICAL
  Filled 2020-11-28: qty 60

## 2020-11-28 MED ORDER — MAGNESIUM SULFATE IN D5W 1-5 GM/100ML-% IV SOLN
1.0000 g | Freq: Once | INTRAVENOUS | Status: AC
  Administered 2020-11-28: 1 g via INTRAVENOUS
  Filled 2020-11-28: qty 100

## 2020-11-28 MED ORDER — LIDOCAINE HCL 1 % IJ SOLN
INTRAMUSCULAR | Status: AC
  Filled 2020-11-28: qty 60

## 2020-11-28 MED ORDER — LIDOCAINE HCL (PF) 1 % IJ SOLN
INTRAMUSCULAR | Status: DC | PRN
  Administered 2020-11-28: 80 mL

## 2020-11-28 MED ORDER — CEFAZOLIN SODIUM-DEXTROSE 1-4 GM/50ML-% IV SOLN
1.0000 g | Freq: Four times a day (QID) | INTRAVENOUS | Status: AC
  Administered 2020-11-28 – 2020-11-29 (×3): 1 g via INTRAVENOUS
  Filled 2020-11-28 (×3): qty 50

## 2020-11-28 SURGICAL SUPPLY — 14 items
CABLE SURGICAL S-101-97-12 (CABLE) ×2 IMPLANT
CATH RIGHTSITE C315HIS02 (CATHETERS) ×2 IMPLANT
IPG PACE AZUR XT DR MRI W1DR01 (Pacemaker) ×1 IMPLANT
LEAD CAPSURE NOVUS 5076-52CM (Lead) ×2 IMPLANT
LEAD SELECT SECURE 3830 383069 (Lead) ×1 IMPLANT
MAT PREVALON FULL STRYKER (MISCELLANEOUS) ×2 IMPLANT
PACE AZURE XT DR MRI W1DR01 (Pacemaker) ×2 IMPLANT
PAD PRO RADIOLUCENT 2001M-C (PAD) ×2 IMPLANT
SELECT SECURE 3830 383069 (Lead) ×2 IMPLANT
SHEATH 7FR PRELUDE SNAP 13 (SHEATH) ×2 IMPLANT
SHEATH 9FR PRELUDE SNAP 13 (SHEATH) ×2 IMPLANT
SLITTER 6232ADJ (MISCELLANEOUS) ×2 IMPLANT
TRAY PACEMAKER INSERTION (PACKS) ×2 IMPLANT
WIRE HI TORQ VERSACORE J 260CM (WIRE) ×2 IMPLANT

## 2020-11-28 NOTE — Progress Notes (Signed)
EKG CRITICAL VALUE     12 lead EKG performed.  Critical value noted.  Loralie Champagne, RN notified.   [place], CCT 11/28/2020 7:05 AM

## 2020-11-28 NOTE — Progress Notes (Signed)
  Echocardiogram 2D Echocardiogram has been performed.  Fidel Levy 11/28/2020, 9:01 AM
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
## 2020-11-28 NOTE — Discharge Instructions (Signed)
    Supplemental Discharge Instructions for  Pacemaker Patients   Activity No heavy lifting or vigorous activity with your left arm for 6 to 8 weeks.  Do not raise your left arm above your head for one week.  Gradually raise your affected arm as drawn below.             12/02/20                   12/03/20                  12/04/20                12/05/20 __  NO DRIVING until cleared to at your wound check visit.  WOUND CARE Keep the wound area clean and dry.  Do not get this area wet , no showers until cleared to at your wound check visit. The tape/steri-strips on your wound will fall off; do not pull them off.  No bandage is needed on the site.  DO  NOT apply any creams, oils, or ointments to the wound area. If you notice any drainage or discharge from the wound, any swelling or bruising at the site, or you develop a fever > 101? F after you are discharged home, call the office at once.  Special Instructions You are still able to use cellular telephones; use the ear opposite the side where you have your pacemaker/defibrillator.  Avoid carrying your cellular phone near your device. When traveling through airports, show security personnel your identification card to avoid being screened in the metal detectors.  Ask the security personnel to use the hand wand. Avoid arc welding equipment, MRI testing (magnetic resonance imaging), TENS units (transcutaneous nerve stimulators).  Call the office for questions about other devices. Avoid electrical appliances that are in poor condition or are not properly grounded. Microwave ovens are safe to be near or to operate.

## 2020-11-29 ENCOUNTER — Inpatient Hospital Stay (HOSPITAL_COMMUNITY): Payer: Medicare HMO

## 2020-11-29 DIAGNOSIS — I5022 Chronic systolic (congestive) heart failure: Secondary | ICD-10-CM

## 2020-11-29 DIAGNOSIS — I2510 Atherosclerotic heart disease of native coronary artery without angina pectoris: Secondary | ICD-10-CM

## 2020-11-29 DIAGNOSIS — I517 Cardiomegaly: Secondary | ICD-10-CM | POA: Diagnosis not present

## 2020-11-29 LAB — BASIC METABOLIC PANEL
Anion gap: 11 (ref 5–15)
BUN: 18 mg/dL (ref 8–23)
CO2: 23 mmol/L (ref 22–32)
Calcium: 8.6 mg/dL — ABNORMAL LOW (ref 8.9–10.3)
Chloride: 106 mmol/L (ref 98–111)
Creatinine, Ser: 1.07 mg/dL (ref 0.61–1.24)
GFR, Estimated: >60 mL/min (ref >60)
Glucose, Bld: 107 mg/dL — ABNORMAL HIGH (ref 70–99)
Potassium: 3 mmol/L — ABNORMAL LOW (ref 3.5–5.1)
Sodium: 140 mmol/L (ref 135–145)

## 2020-11-29 LAB — MAGNESIUM: Magnesium: 2 mg/dL (ref 1.7–2.4)

## 2020-11-29 LAB — POTASSIUM: Potassium: 3.7 mmol/L (ref 3.5–5.1)

## 2020-11-29 MED ORDER — CARVEDILOL 6.25 MG PO TABS: 6.2500 mg | ORAL_TABLET | Freq: Two times a day (BID) | ORAL | 1 refills | Status: DC

## 2020-11-29 MED ORDER — POTASSIUM CHLORIDE CRYS ER 20 MEQ PO TBCR
60.0000 meq | EXTENDED_RELEASE_TABLET | Freq: Once | ORAL | Status: AC
  Administered 2020-11-29: 60 meq via ORAL
  Filled 2020-11-29: qty 3

## 2020-11-29 MED ORDER — POTASSIUM CHLORIDE CRYS ER 20 MEQ PO TBCR
30.0000 meq | EXTENDED_RELEASE_TABLET | ORAL | Status: AC
  Administered 2020-11-29 (×2): 30 meq via ORAL
  Filled 2020-11-29 (×2): qty 1

## 2020-11-29 MED ORDER — CARVEDILOL 6.25 MG PO TABS: 6.2500 mg | ORAL_TABLET | Freq: Two times a day (BID) | ORAL | Status: DC

## 2020-11-29 MED ORDER — POTASSIUM CHLORIDE CRYS ER 20 MEQ PO TBCR: 20.0000 meq | EXTENDED_RELEASE_TABLET | Freq: Every day | ORAL | Status: DC

## 2020-11-29 MED ORDER — POTASSIUM CHLORIDE CRYS ER 20 MEQ PO TBCR: 20.0000 meq | EXTENDED_RELEASE_TABLET | Freq: Every day | ORAL | 3 refills | Status: DC

## 2020-11-29 MED ORDER — NITROGLYCERIN 0.4 MG SL SUBL: 0.4000 mg | SUBLINGUAL_TABLET | SUBLINGUAL | 1 refills | Status: AC | PRN

## 2020-11-29 NOTE — Progress Notes (Signed)
Progress Note  Patient Name: Jonathan Garner. Date of Encounter: 11/29/2020  [facility] Cardiologist: Carlyle Dolly, MD   Subjective   Currently feeling well.  No chest pain or shortness of breath.  Pacemaker implanted yesterday.  Feeling much improved.  Inpatient Medications    Scheduled Meds:  allopurinol  450 mg Oral Daily   aspirin EC  81 mg Oral Daily   atorvastatin  80 mg Oral q1800   Chlorhexidine Gluconate Cloth  6 each Topical Daily   cholecalciferol  5,000 Units Oral Daily   mupirocin ointment  1 application Nasal BID   sacubitril-valsartan  1 tablet Oral BID   sodium chloride flush  3 mL Intravenous Q12H   sodium chloride flush  3 mL Intravenous Q12H   spironolactone  25 mg Oral Daily   torsemide  60 mg Oral Daily   vitamin B-12  5,000 mcg Oral Daily   Continuous Infusions:  sodium chloride 250 mL (11/29/20 0842)    ceFAZolin (ANCEF) IV 1 g (11/29/20 0846)   PRN Meds: sodium chloride, acetaminophen, albuterol, ALPRAZolam, nitroGLYCERIN, ondansetron (ZOFRAN) IV, sodium chloride flush, zolpidem   Vital Signs    Vitals:   11/29/20 0500 11/29/20 0607 11/29/20 0700 11/29/20 0749  BP: (!) 146/70 (!) 152/80    Pulse: 88 91 89   Resp: (!) 22 (!) 21 16   Temp:    98.3 F (36.8 C)  TempSrc:    Oral  SpO2: 92% 94% 95%   Weight:      Height:        Intake/Output Summary (Last 24 hours) at 11/29/2020 0858 Last data filed at 11/29/2020 0400 Gross per 24 hour  Intake 630.86 ml  Output 1300 ml  Net -669.14 ml   Last 3 Weights 11/27/2020 11/27/2020 11/21/2020  Weight (lbs) 222 lb 10.6 oz 218 lb 225 lb 12.8 oz  Weight (kg) 101 kg 98.884 kg 102.422 kg      Telemetry    A sense, V paced - Personally Reviewed  ECG    Atrial sensed, ventricular paced- Personally Reviewed  Physical Exam   GEN: Well nourished, well developed, in no acute distress  HEENT: normal  Neck: no JVD, carotid bruits, or masses Cardiac: RRR; no murmurs, rubs, or  gallops,no edema  Respiratory:  clear to auscultation bilaterally, normal work of breathing GI: soft, nontender, nondistended, + BS MS: no deformity or atrophy  Skin: warm and dry, device site well healed Neuro:  Strength and sensation are intact Psych: euthymic mood, full affect   Labs    High Sensitivity Troponin:  No results for input(s): TROPONINIHS in the last 720 hours.   Chemistry Recent Labs  Lab 11/27/20 1929 11/28/20 0339 11/29/20 0240  NA 140 141 140  K 3.5 3.0* 3.0*  CL 105 107 106  CO2 25 23 23   GLUCOSE 119* 110* 107*  BUN 28* 23 18  CREATININE 1.34* 1.30* 1.07  CALCIUM 8.5* 8.7* 8.6*  MG 1.9 1.7 2.0  PROT  --  5.6*  --   ALBUMIN  --  3.2*  --   AST  --  16  --   ALT  --  13  --   ALKPHOS  --  107  --   BILITOT  --  0.8  --   GFRNONAA 55* 57* >60  ANIONGAP 10 11 11     Lipids No results for input(s): CHOL, TRIG, HDL, LABVLDL, LDLCALC, CHOLHDL in the last 168 hours.  Hematology Recent Labs  Lab 11/27/20 1929  WBC 8.3  RBC 3.76*  HGB 11.8*  HCT 37.0*  MCV 98.4  MCH 31.4  MCHC 31.9  RDW 16.1*  PLT 197   Thyroid  Recent Labs  Lab 11/27/20 2311  TSH 2.076    BNP Recent Labs  Lab 11/27/20 2311  BNP 1,193.3*    DDimer No results for input(s): DDIMER in the last 168 hours.   Radiology    DG Chest 2 View  Result Date: 11/29/2020 CLINICAL DATA:  Encounter for cardiac device EXAM: CHEST - 2 VIEW COMPARISON:  11/27/2020 FINDINGS: Cardiomegaly and aortic tortuosity. Aortic stent grafting. Dual-chamber pacer implant with leads roughly over the right ventricle and right atrial appendage. No pneumothorax. IMPRESSION: No unexpected finding after dual-chamber pacer implant. Electronically Signed   By: Jorje Guild M.D.   On: 11/29/2020 06:06   EP PPM/ICD IMPLANT  Result Date: 11/28/2020 SURGEON:  Maximillian Habibi Meredith Leeds, MD   PREPROCEDURE DIAGNOSIS:  complete AV block   POSTPROCEDURE DIAGNOSIS:  complete AV block    PROCEDURES:  1. Pacemaker  implantation.   INTRODUCTION: Jonathan Hoefling. is a 75 y.o. male  with a history of bradycardia who presents today for pacemaker implantation.  The patient reports intermittent episodes of fatigue, syncope over the past few weeks.  No reversible causes have been identified.  The patient therefore presents today for pacemaker implantation.   DESCRIPTION OF PROCEDURE:  Informed written consent was obtained, and  the patient was brought to the electrophysiology lab in a fasting state.  The patient required no sedation for the procedure today.  The patients left chest was prepped and draped in the usual sterile fashion by the EP lab staff. The skin overlying the left deltopectoral region was infiltrated with lidocaine for local analgesia.  A 4-cm incision was made over the left deltopectoral region.  A left subcutaneous pacemaker pocket was fashioned using a combination of sharp and blunt dissection. Electrocautery was required to assure hemostasis.  RA/RV Lead Placement: The left axillary vein was cannulated.  Through the left axillary vein, a Medtronic model 5076 (serial number [id number]) right atrial lead and a Medtronic model 1610 (serial number [id number]) right ventricular lead were advanced with fluoroscopic visualization into the right atrial appendage and right ventricular apex positions respectively.  Initial atrial lead P- waves measured 1.61mV with impedance of 552 ohms and a threshold of 0.4 V at 0.5 msec.  Right ventricular lead R-waves measured 5.2 mV with an impedance of 527 ohms and a threshold of 0.9 V at 0.5 msec.  Both leads were secured to the pectoralis fascia using #2-0 silk over the suture sleeves. Device Placement:  The leads were then connected to a Medtronic Azure XT DR MRI SureScan (serial number [id number]) pacemaker.  The pocket was irrigated with copious gentamicin solution.  The pacemaker was then placed into the pocket.  The pocket was then closed in 3 layers with 2.0 Vicryl  suture for the subcutaneous and 3.0 Vicryl suture subcuticular layers.  Steri-Strips and a sterile dressing were then applied. EBL<37ml. There were no early apparent complications.   CONCLUSIONS:  1. Successful implantation of a Medtronic Azure XT DR MRI SureScan dual-chamber pacemaker for symptomatic bradycardia  2. No early apparent complications.       [place] Kinyon Meredith Leeds, MD 11/28/2020 3:20 PM   DG Chest Portable 1 View  Result Date: 11/27/2020 CLINICAL DATA:  Syncope. EXAM: PORTABLE CHEST 1 VIEW COMPARISON:  10/26/2017 FINDINGS: Cardiac enlargement. No  vascular congestion, edema, or consolidation. No pleural effusions. No pneumothorax. Postoperative stenting of the descending aorta. Mediastinal contours appear intact. IMPRESSION: Cardiac enlargement.  No evidence of active pulmonary disease. Electronically Signed   By: Lucienne Capers M.D.   On: 11/27/2020 20:06   ECHOCARDIOGRAM COMPLETE  Result Date: 11/28/2020    ECHOCARDIOGRAM REPORT   Patient Name:   Jonathan Garner. Date of Exam: 11/28/2020 Medical Rec #:  [id number]            Height:       66.0 in Accession #:    [id number]           Weight:       222.7 lb Date of Birth:  1945-12-31            BSA:          2.093 m Patient Age:    20 years             BP:           142/50 mmHg Patient Gender: M                    HR:           37 bpm. Exam Location:  Inpatient Procedure: 2D Echo, Cardiac Doppler, Color Doppler and Intracardiac            Opacification Agent STAT ECHO Indications:    R94.31 Abnormal EKG  History:        Patient has prior history of Echocardiogram examinations, most                 recent 09/27/2019. CHF, CAD; Risk Factors:Dyslipidemia and                 Hypertension.  Sonographer:    Bernadene Person RDCS Referring Phys: [id number] [place]  1. Left ventricular ejection fraction, by estimation, is 50%. The left ventricle has low normal function. The left ventricle demonstrates regional wall motion  abnormalities (see scoring diagram/findings for description). The left ventricular internal cavity size was mildly dilated. Left ventricular diastolic parameters are indeterminate.  2. Right ventricular systolic function is mildly reduced. The right ventricular size is normal. There is normal pulmonary artery systolic pressure. The estimated right ventricular systolic pressure is 40.3 mmHg.  3. Systolic and diastolic MR seen. The mitral valve is grossly normal. Mild mitral valve regurgitation. No evidence of mitral stenosis.  4. Systolic and diastolic TR seen. Mild TR.  5. The aortic valve is grossly normal. There is mild calcification of the aortic valve. Aortic valve regurgitation is not visualized. No aortic stenosis is present.  6. The inferior vena cava is dilated in size with <50% respiratory variability, suggesting right atrial pressure of 15 mmHg. FINDINGS  Left Ventricle: Left ventricular ejection fraction, by estimation, is 50%. The left ventricle has low normal function. The left ventricle demonstrates regional wall motion abnormalities. Definity contrast agent was given IV to delineate the left ventricular endocardial borders. The left ventricular internal cavity size was mildly dilated. There is no left ventricular hypertrophy. Left ventricular diastolic parameters are indeterminate.  LV Wall Scoring: The apical septal segment is hypokinetic. Right Ventricle: The right ventricular size is normal. No increase in right ventricular wall thickness. Right ventricular systolic function is mildly reduced. There is normal pulmonary artery systolic pressure. The tricuspid regurgitant velocity is 2.08 m/s, and with an assumed right atrial pressure of 15 mmHg, the estimated right ventricular  systolic pressure is 03.7 mmHg. Left Atrium: Left atrial size was normal in size. Right Atrium: Right atrial size was normal in size. Pericardium: There is no evidence of pericardial effusion. Mitral Valve: Systolic and  diastolic MR seen. The mitral valve is grossly normal. Mild mitral annular calcification. Mild mitral valve regurgitation. No evidence of mitral valve stenosis. Tricuspid Valve: Systolic and diastolic TR seen. The tricuspid valve is normal in structure. Tricuspid valve regurgitation is mild . No evidence of tricuspid stenosis. Aortic Valve: The aortic valve is grossly normal. There is mild calcification of the aortic valve. Aortic valve regurgitation is not visualized. No aortic stenosis is present. Pulmonic Valve: The pulmonic valve was grossly normal. Pulmonic valve regurgitation is mild. No evidence of pulmonic stenosis. Aorta: The ascending aorta was not well visualized and the aortic root is normal in size and structure. Venous: The inferior vena cava is dilated in size with less than 50% respiratory variability, suggesting right atrial pressure of 15 mmHg. IAS/Shunts: No atrial level shunt detected by color flow Doppler.  LEFT VENTRICLE PLAX 2D LVIDd:         6.00 cm LVIDs:         4.30 cm LV PW:         0.90 cm LV IVS:        1.00 cm LVOT diam:     2.10 cm LV SV:         110 LV SV Index:   52 LVOT Area:     3.46 cm  LV Volumes (MOD) LV vol d, MOD A2C: 113.0 ml LV vol d, MOD A4C: 133.0 ml LV vol s, MOD A2C: 57.0 ml LV vol s, MOD A4C: 62.4 ml LV SV MOD A2C:     56.0 ml LV SV MOD A4C:     133.0 ml LV SV MOD BP:      63.0 ml RIGHT VENTRICLE TAPSE (M-mode): 1.6 cm LEFT ATRIUM             Index        RIGHT ATRIUM           Index LA diam:        4.70 cm 2.25 cm/m   RA Area:     15.80 cm LA Vol (A2C):   65.5 ml 31.29 ml/m  RA Volume:   34.00 ml  16.24 ml/m LA Vol (A4C):   62.4 ml 29.81 ml/m LA Biplane Vol: 64.8 ml 30.96 ml/m  AORTIC VALVE LVOT Vmax:   142.00 cm/s LVOT Vmean:  79.800 cm/s LVOT VTI:    0.317 m  AORTA Ao Asc diam: 3.60 cm MR PISA:        1.57 cm  TRICUSPID VALVE MR PISA Radius: 0.50 cm   TR Peak grad:   17.3 mmHg                           TR Vmax:        208.00 cm/s                             SHUNTS                           Systemic VTI:  0.32 m  Systemic Diam: 2.10 cm Cherlynn Kaiser MD Electronically signed by Cherlynn Kaiser MD Signature Date/Time: 11/28/2020/10:31:59 AM    Final     Cardiac Studies   TTE 11/29/20  1. Left ventricular ejection fraction, by estimation, is 50%. The left  ventricle has low normal function. The left ventricle demonstrates  regional wall motion abnormalities (see scoring diagram/findings for  description). The left ventricular internal  cavity size was mildly dilated. Left ventricular diastolic parameters are  indeterminate.   2. Right ventricular systolic function is mildly reduced. The right  ventricular size is normal. There is normal pulmonary artery systolic  pressure. The estimated right ventricular systolic pressure is 54.0 mmHg.   3. Systolic and diastolic MR seen. The mitral valve is grossly normal.  Mild mitral valve regurgitation. No evidence of mitral stenosis.   4. Systolic and diastolic TR seen. Mild TR.   5. The aortic valve is grossly normal. There is mild calcification of the  aortic valve. Aortic valve regurgitation is not visualized. No aortic  stenosis is present.   6. The inferior vena cava is dilated in size with <50% respiratory  variability, suggesting right atrial pressure of 15 mmHg.   Patient Profile     75 y.o. male with a history of ischemic cardiomyopathy presented to the hospital with syncope and complete heart block, now status post dual-chamber pacemaker.  Assessment & Plan    1.  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  Device functioning appropriately.  Okay for discharge with follow-up in clinic.  2.  Coronary artery disease: Non-STEMI August 2022 with drug-eluting stents to the LAD and RCA.  Can restart aspirin and Plavix today.  3.  Chronic systolic heart failure due to ischemic cardiomyopathy: Ejection fraction fortunately is improved to 50%.  Home  medications include Jardiance, Entresto, Aldactone.  With his pacemaker in place, he would tolerate beta-blockade.  Would start Coreg 6.25 mg twice daily at discharge.  4.  Hypokalemia: Potassium repletion given.  For questions or updates, please contact [place] Please consult [email or web address] for contact info under        Signed, Aira Sallade Meredith Leeds, MD  11/29/2020, 8:58 AM

## 2020-11-29 NOTE — Plan of Care (Signed)
  Problem: Education: Goal: Knowledge of General Education information will improve Description: Including pain rating scale, medication(s)/side effects and non-pharmacologic comfort measures Outcome: Adequate for Discharge   Problem: Health Behavior/Discharge Planning: Goal: Ability to manage health-related needs will improve Outcome: Adequate for Discharge   Problem: Clinical Measurements: Goal: Ability to maintain clinical measurements within normal limits will improve Outcome: Adequate for Discharge Goal: Will remain free from infection Outcome: Adequate for Discharge Goal: Diagnostic test results will improve Outcome: Adequate for Discharge Goal: Respiratory complications will improve Outcome: Adequate for Discharge Goal: Cardiovascular complication will be avoided Outcome: Adequate for Discharge   Problem: Activity: Goal: Risk for activity intolerance will decrease Outcome: Adequate for Discharge   Problem: Nutrition: Goal: Adequate nutrition will be maintained Outcome: Adequate for Discharge   Problem: Coping: Goal: Level of anxiety will decrease Outcome: Adequate for Discharge   Problem: Elimination: Goal: Will not experience complications related to bowel motility Outcome: Adequate for Discharge Goal: Will not experience complications related to urinary retention Outcome: Adequate for Discharge   Problem: Pain Managment: Goal: General experience of comfort will improve Outcome: Adequate for Discharge   Problem: Safety: Goal: Ability to remain free from injury will improve Outcome: Adequate for Discharge   Problem: Skin Integrity: Goal: Risk for impaired skin integrity will decrease Outcome: Adequate for Discharge   Problem: Education: Goal: Knowledge of cardiac device and self-care will improve Outcome: Adequate for Discharge Goal: Ability to safely manage health related needs after discharge will improve Outcome: Adequate for Discharge Goal:  Individualized Educational Video(s) Outcome: Adequate for Discharge   Problem: Cardiac: Goal: Ability to achieve and maintain adequate cardiopulmonary perfusion will improve Outcome: Adequate for Discharge   

## 2020-11-29 NOTE — Discharge Summary (Addendum)
ELECTROPHYSIOLOGY PROCEDURE DISCHARGE SUMMARY    Patient ID: Jonathan Raschke.,  MRN: [id number], DOB/AGE: 76-Jan-1947 76 y.o.  Admit date: 11/27/2020 Discharge date: 11/29/2020  Primary Care Physician: Ramiro Harvest, PA-C  Primary Cardiologist: Carlyle Dolly, MD  Electrophysiologist: Cristopher Peru, MD   Primary Discharge Diagnosis:  Symptomatic bradycardia status post pacemaker implantation this admission  Secondary Discharge Diagnosis:  CAD (s/p NSTEMI in 09/2020 with DES to proximal-LAD and DES to mid-RCA) HFrEF HTN HLD AAA (s/p repair) OSA  No Known Allergies   Procedures This Admission:  1.  Implantation of a Medtronic dual chamber PPM on 11/28/2020 by Dr. Curt Bears. The patient received a Medtronic  Azure XT DR MRI SureScan (serial number [id number]) with Medtronic model [id number] (serial number [id number]) right atrial lead and a Medtronic model 3267 (serial number [id number]) right ventricular lead. There were no immediate post procedure complications. 2.  CXR on 11/29/2020 demonstrated no pneumothorax status post device implantation.   Brief HPI: Jonathan Dupree. is a 75 y.o. male was admitted for evaluation of a syncopal episode on 11/27/2020 and was found to be in complete heart block and electrophysiology team asked to see for consideration of PPM implantation. Past medical history includes CAD (s/p NSTEMI in 09/2020 with DES to proximal-LAD and DES to mid-RCA), HFrEF (EF 25% by echo in 09/2020), HTN, HLD, AAA and OSA . The patient has had symptomatic bradycardia without reversible causes identified. Risks, benefits, and alternatives to PPM implantation were reviewed with the patient who wished to proceed.   Hospital Course:  The patient was admitted and underwent implantation of a Medtronic dual chamber PPM with details as outlined above. He was monitored on telemetry overnight which demonstrated A-sensed, V-paced. Left chest was without hematoma or  ecchymosis. The device was interrogated and found to be functioning normally.  CXR was obtained and demonstrated no pneumothorax status post device implantation. Repeat echocardiogram was obtained and showed his EF had improved to 50%. Wound care, arm mobility, and restrictions were reviewed with the patient. The patient was examined and considered stable for discharge to home. His K+ was low at 3.0 and this was replaced along with him being started on K-dur 20 mEq at discharge with plans for a repeat BMET in 7-10 days.    Physical Exam: Vitals:   11/29/20 0700 11/29/20 0749 11/29/20 0800 11/29/20 0900  BP:   (!) 149/71 (!) 162/73  Pulse: 89  90 93  Resp: 16  (!) 21 17  Temp:  98.3 F (36.8 C)    TempSrc:  Oral    SpO2: 95%  95% 97%  Weight:      Height:        Labs:   Lab Results  Component Value Date   WBC 8.3 11/27/2020   HGB 11.8 (L) 11/27/2020   HCT 37.0 (L) 11/27/2020   MCV 98.4 11/27/2020   PLT 197 11/27/2020    Recent Labs  Lab 11/28/20 0339 11/29/20 0240  NA 141 140  K 3.0* 3.0*  CL 107 106  CO2 23 23  BUN 23 18  CREATININE 1.30* 1.07  CALCIUM 8.7* 8.6*  PROT 5.6*  --   BILITOT 0.8  --   ALKPHOS 107  --   ALT 13  --   AST 16  --   GLUCOSE 110* 107*    Discharge Medications:  Allergies as of 11/29/2020   No Known Allergies      Medication List  TAKE these medications    acetaminophen 500 MG tablet Commonly known as: TYLENOL Take 500 mg by mouth every 8 (eight) hours as needed for moderate pain.   albuterol 108 (90 Base) MCG/ACT inhaler Commonly known as: VENTOLIN HFA Inhale 1-2 puffs into the lungs every 6 (six) hours as needed for wheezing or shortness of breath.   allopurinol 300 MG tablet Commonly known as: ZYLOPRIM TAKE 1 & 1/2 (ONE & ONE-HALF) TABLETS BY MOUTH ONCE DAILY What changed: See the new instructions.   aspirin EC 81 MG tablet Take 81 mg by mouth daily.   atorvastatin 80 MG tablet Commonly known as: LIPITOR Take 80 mg  by mouth at bedtime.   carvedilol 6.25 MG tablet Commonly known as: COREG Take 1 tablet (6.25 mg total) by mouth 2 (two) times daily with a meal.   clopidogrel 75 MG tablet Commonly known as: PLAVIX Take 75 mg by mouth daily.   [employer] Vitamin B-12 5000 MCG Subl Generic drug: Cyanocobalamin Place 5,000 mcg under the tongue daily.   Entresto 24-26 MG Generic drug: sacubitril-valsartan Take 1 tablet by mouth 2 (two) times daily.   Jardiance 10 MG Tabs tablet Generic drug: empagliflozin Take 10 mg by mouth daily.   nitroGLYCERIN 0.4 MG SL tablet Commonly known as: NITROSTAT Place 1 tablet (0.4 mg total) under the tongue every 5 (five) minutes x 3 doses as needed for chest pain.   potassium chloride SA 20 MEQ tablet Commonly known as: KLOR-CON Take 1 tablet (20 mEq total) by mouth daily. Start taking on: November 30, 2020   spironolactone 25 MG tablet Commonly known as: ALDACTONE Take 12.5 mg by mouth daily.   torsemide 20 MG tablet Commonly known as: DEMADEX Take 1 tablet (20 mg total) by mouth daily. What changed:  how much to take additional instructions   Vitamin D3 125 MCG (5000 UT) Caps Take 5,000 Units by mouth daily.               Discharge Care Instructions  (From admission, onward)           Start     Ordered   11/29/20 0000  Discharge wound care:       Comments: Keep steri-strips in place.   11/29/20 1037            Disposition:  Discharge Instructions     Diet - low sodium heart healthy   Complete by: As directed    Discharge wound care:   Complete by: As directed    Keep steri-strips in place.   Increase activity slowly   Complete by: As directed        Follow-up Information     Jonathan Haw, MD Follow up.   Specialty: Cardiology Why: 03/03/21 @ 2:45PM Contact information: [address] [place] [address] [phone]         [facility] Follow up.   Specialty:  Cardiology Why: 12/11/20 @ 11:20AM, wound check visit Contact information: [address] [place] Follow up.   Specialty: Cardiology Why: You should have follow-up labs in 1 week to recheck your potassium. The office Jonathan Garner call you in regards to arranging this. Contact information: [place] [phone]                Duration of  Discharge Encounter: Greater than 30 minutes including physician time.  Signed, Erma Heritage, PA-C  11/29/2020 10:57 AM  I have seen and examined this patient with [place].  Agree with above, note added to reflect my findings.  On exam, RRR, no murmurs.  He is now status post Medtronic dual chamber pacemaker for complete AV block.  Device functioning appropriately.  Chest x-ray and interrogation without issue.  Plan for discharge today with follow-up in device clinic.  Kamyra Schroeck M. [place] Minion MD 11/29/2020 12:03 PM

## 2020-11-30 DIAGNOSIS — I255 Ischemic cardiomyopathy: Secondary | ICD-10-CM | POA: Diagnosis not present

## 2020-11-30 DIAGNOSIS — Z48298 Encounter for aftercare following other organ transplant: Secondary | ICD-10-CM | POA: Diagnosis not present

## 2020-11-30 DIAGNOSIS — I110 Hypertensive heart disease with heart failure: Secondary | ICD-10-CM | POA: Diagnosis not present

## 2020-11-30 DIAGNOSIS — I252 Old myocardial infarction: Secondary | ICD-10-CM | POA: Diagnosis not present

## 2020-11-30 DIAGNOSIS — I5023 Acute on chronic systolic (congestive) heart failure: Secondary | ICD-10-CM | POA: Diagnosis not present

## 2020-11-30 DIAGNOSIS — J449 Chronic obstructive pulmonary disease, unspecified: Secondary | ICD-10-CM | POA: Diagnosis not present

## 2020-11-30 DIAGNOSIS — A419 Sepsis, unspecified organism: Secondary | ICD-10-CM | POA: Diagnosis not present

## 2020-11-30 DIAGNOSIS — N170 Acute kidney failure with tubular necrosis: Secondary | ICD-10-CM | POA: Diagnosis not present

## 2020-11-30 DIAGNOSIS — D649 Anemia, unspecified: Secondary | ICD-10-CM | POA: Diagnosis not present

## 2020-11-30 DIAGNOSIS — I2510 Atherosclerotic heart disease of native coronary artery without angina pectoris: Secondary | ICD-10-CM | POA: Diagnosis not present

## 2020-12-01 ENCOUNTER — Encounter (HOSPITAL_COMMUNITY): Payer: Self-pay | Admitting: Cardiology

## 2020-12-01 ENCOUNTER — Encounter: Payer: Self-pay | Admitting: *Deleted

## 2020-12-01 ENCOUNTER — Telehealth: Payer: Self-pay | Admitting: Student

## 2020-12-01 ENCOUNTER — Ambulatory Visit (HOSPITAL_COMMUNITY): Admission: RE | Admit: 2020-12-01 | Payer: Medicare HMO | Source: Home / Self Care | Admitting: Internal Medicine

## 2020-12-01 DIAGNOSIS — Z79899 Other long term (current) drug therapy: Secondary | ICD-10-CM

## 2020-12-01 SURGERY — PACEMAKER IMPLANT

## 2020-12-01 NOTE — Telephone Encounter (Signed)
Patient needs lab order put into system for a Potassium check due on 12-05-2020. See hospital d/c notes.

## 2020-12-01 NOTE — Telephone Encounter (Signed)
Lab order entered. Pt aware and verbalized understanding. Pt to come to [facility] for lab work- potassium check

## 2020-12-01 NOTE — Telephone Encounter (Signed)
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 and he needs a repeat basic metabolic panel in 1 week per Dr. Curt Bears. Can you please reach out to the patient on Monday in helping to get this arranged for next Friday preferably?  You can enter the orders under me and he can have them obtained at [facility] or [facility], whichever he prefers.  Thanks for your help!  [place],  [place]

## 2020-12-01 NOTE — Telephone Encounter (Signed)
This encounter was created in error - please disregard.

## 2020-12-05 ENCOUNTER — Other Ambulatory Visit (HOSPITAL_COMMUNITY)
Admission: RE | Admit: 2020-12-05 | Discharge: 2020-12-05 | Disposition: A | Discharge status: Home / Self Care | Payer: Medicare HMO | Source: Ambulatory Visit | Attending: Cardiology | Admitting: Cardiology

## 2020-12-05 ENCOUNTER — Other Ambulatory Visit: Payer: Self-pay

## 2020-12-05 DIAGNOSIS — I110 Hypertensive heart disease with heart failure: Secondary | ICD-10-CM | POA: Diagnosis not present

## 2020-12-05 DIAGNOSIS — D649 Anemia, unspecified: Secondary | ICD-10-CM | POA: Diagnosis not present

## 2020-12-05 DIAGNOSIS — I2510 Atherosclerotic heart disease of native coronary artery without angina pectoris: Secondary | ICD-10-CM | POA: Diagnosis not present

## 2020-12-05 DIAGNOSIS — A419 Sepsis, unspecified organism: Secondary | ICD-10-CM | POA: Diagnosis not present

## 2020-12-05 DIAGNOSIS — I5023 Acute on chronic systolic (congestive) heart failure: Secondary | ICD-10-CM | POA: Diagnosis not present

## 2020-12-05 DIAGNOSIS — I255 Ischemic cardiomyopathy: Secondary | ICD-10-CM | POA: Diagnosis not present

## 2020-12-05 DIAGNOSIS — Z48298 Encounter for aftercare following other organ transplant: Secondary | ICD-10-CM | POA: Diagnosis not present

## 2020-12-05 DIAGNOSIS — Z79899 Other long term (current) drug therapy: Secondary | ICD-10-CM | POA: Insufficient documentation

## 2020-12-05 DIAGNOSIS — I252 Old myocardial infarction: Secondary | ICD-10-CM | POA: Diagnosis not present

## 2020-12-05 DIAGNOSIS — N170 Acute kidney failure with tubular necrosis: Secondary | ICD-10-CM | POA: Diagnosis not present

## 2020-12-05 DIAGNOSIS — J449 Chronic obstructive pulmonary disease, unspecified: Secondary | ICD-10-CM | POA: Diagnosis not present

## 2020-12-05 LAB — BASIC METABOLIC PANEL
Anion gap: 11 (ref 5–15)
BUN: 24 mg/dL — ABNORMAL HIGH (ref 8–23)
CO2: 24 mmol/L (ref 22–32)
Calcium: 8.8 mg/dL — ABNORMAL LOW (ref 8.9–10.3)
Chloride: 104 mmol/L (ref 98–111)
Creatinine, Ser: 1.12 mg/dL (ref 0.61–1.24)
GFR, Estimated: >60 mL/min (ref >60)
Glucose, Bld: 126 mg/dL — ABNORMAL HIGH (ref 70–99)
Potassium: 4.1 mmol/L (ref 3.5–5.1)
Sodium: 139 mmol/L (ref 135–145)

## 2020-12-08 DIAGNOSIS — I252 Old myocardial infarction: Secondary | ICD-10-CM | POA: Diagnosis not present

## 2020-12-08 DIAGNOSIS — A419 Sepsis, unspecified organism: Secondary | ICD-10-CM | POA: Diagnosis not present

## 2020-12-08 DIAGNOSIS — I5023 Acute on chronic systolic (congestive) heart failure: Secondary | ICD-10-CM | POA: Diagnosis not present

## 2020-12-08 DIAGNOSIS — N170 Acute kidney failure with tubular necrosis: Secondary | ICD-10-CM | POA: Diagnosis not present

## 2020-12-08 DIAGNOSIS — I255 Ischemic cardiomyopathy: Secondary | ICD-10-CM | POA: Diagnosis not present

## 2020-12-08 DIAGNOSIS — I110 Hypertensive heart disease with heart failure: Secondary | ICD-10-CM | POA: Diagnosis not present

## 2020-12-08 DIAGNOSIS — D649 Anemia, unspecified: Secondary | ICD-10-CM | POA: Diagnosis not present

## 2020-12-08 DIAGNOSIS — I2510 Atherosclerotic heart disease of native coronary artery without angina pectoris: Secondary | ICD-10-CM | POA: Diagnosis not present

## 2020-12-08 DIAGNOSIS — J449 Chronic obstructive pulmonary disease, unspecified: Secondary | ICD-10-CM | POA: Diagnosis not present

## 2020-12-08 DIAGNOSIS — Z48298 Encounter for aftercare following other organ transplant: Secondary | ICD-10-CM | POA: Diagnosis not present

## 2020-12-11 ENCOUNTER — Ambulatory Visit (INDEPENDENT_AMBULATORY_CARE_PROVIDER_SITE_OTHER): Payer: Medicare HMO

## 2020-12-11 ENCOUNTER — Other Ambulatory Visit: Payer: Self-pay

## 2020-12-11 DIAGNOSIS — I442 Atrioventricular block, complete: Secondary | ICD-10-CM

## 2020-12-11 LAB — CUP PACEART INCLINIC DEVICE CHECK
Battery Remaining Longevity: 137 mo
Battery Voltage: 3.22 V
Brady Statistic AP VP Percent: 0.54 %
Brady Statistic AP VS Percent: 0 %
Brady Statistic AS VP Percent: 99.43 %
Brady Statistic AS VS Percent: 0.03 %
Brady Statistic RA Percent Paced: 0.53 %
Brady Statistic RV Percent Paced: 99.97 %
Date Time Interrogation Session: 20221027114504
Implantable Lead Implant Date: 20221014
Implantable Lead Implant Date: 20221014
Implantable Lead Location: 753859
Implantable Lead Location: 753860
Implantable Lead Model: 3830
Implantable Lead Model: 5076
Implantable Pulse Generator Implant Date: 20221014
Lead Channel Impedance Value: 342 Ohm
Lead Channel Impedance Value: 361 Ohm
Lead Channel Impedance Value: 437 Ohm
Lead Channel Impedance Value: 551 Ohm
Lead Channel Pacing Threshold Amplitude: 0.5 V
Lead Channel Pacing Threshold Amplitude: 1 V
Lead Channel Pacing Threshold Pulse Width: 0.4 ms
Lead Channel Pacing Threshold Pulse Width: 0.4 ms
Lead Channel Sensing Intrinsic Amplitude: 5.25 mV
Lead Channel Sensing Intrinsic Amplitude: 7.375 mV
Lead Channel Setting Pacing Amplitude: 3.5 V
Lead Channel Setting Pacing Amplitude: 3.5 V
Lead Channel Setting Pacing Pulse Width: 0.4 ms
Lead Channel Setting Sensing Sensitivity: 0.9 mV

## 2020-12-11 NOTE — Progress Notes (Signed)
Wound check appointment. Steri-strips removed. Wound without redness or edema. Incision edges approximated, wound well healed. Normal device function. Thresholds, P wave sensing, and impedances consistent with implant measurements. Device programmed at 3.5V for extra safety margin until 3 month visit. Histogram distribution appropriate for patient and level of activity. No mode switches or high ventricular rates noted. Patient educated about wound care, arm mobility, lifting restrictions. ROV with Dr. Curt Bears 03/03/21.  Patient is enrolled in remote monitoring, next scheduled check 03/02/21

## 2020-12-11 NOTE — Patient Instructions (Signed)

## 2020-12-15 DIAGNOSIS — I110 Hypertensive heart disease with heart failure: Secondary | ICD-10-CM | POA: Diagnosis not present

## 2020-12-15 DIAGNOSIS — I255 Ischemic cardiomyopathy: Secondary | ICD-10-CM | POA: Diagnosis not present

## 2020-12-15 DIAGNOSIS — I252 Old myocardial infarction: Secondary | ICD-10-CM | POA: Diagnosis not present

## 2020-12-15 DIAGNOSIS — N170 Acute kidney failure with tubular necrosis: Secondary | ICD-10-CM | POA: Diagnosis not present

## 2020-12-15 DIAGNOSIS — I2510 Atherosclerotic heart disease of native coronary artery without angina pectoris: Secondary | ICD-10-CM | POA: Diagnosis not present

## 2020-12-15 DIAGNOSIS — I5023 Acute on chronic systolic (congestive) heart failure: Secondary | ICD-10-CM | POA: Diagnosis not present

## 2020-12-15 DIAGNOSIS — A419 Sepsis, unspecified organism: Secondary | ICD-10-CM | POA: Diagnosis not present

## 2020-12-15 DIAGNOSIS — D649 Anemia, unspecified: Secondary | ICD-10-CM | POA: Diagnosis not present

## 2020-12-15 DIAGNOSIS — Z48298 Encounter for aftercare following other organ transplant: Secondary | ICD-10-CM | POA: Diagnosis not present

## 2020-12-15 DIAGNOSIS — J449 Chronic obstructive pulmonary disease, unspecified: Secondary | ICD-10-CM | POA: Diagnosis not present

## 2020-12-16 DIAGNOSIS — S61431D Puncture wound without foreign body of right hand, subsequent encounter: Secondary | ICD-10-CM | POA: Diagnosis not present

## 2020-12-18 DIAGNOSIS — I5023 Acute on chronic systolic (congestive) heart failure: Secondary | ICD-10-CM | POA: Diagnosis not present

## 2020-12-18 DIAGNOSIS — J449 Chronic obstructive pulmonary disease, unspecified: Secondary | ICD-10-CM | POA: Diagnosis not present

## 2020-12-18 DIAGNOSIS — I255 Ischemic cardiomyopathy: Secondary | ICD-10-CM | POA: Diagnosis not present

## 2020-12-18 DIAGNOSIS — I252 Old myocardial infarction: Secondary | ICD-10-CM | POA: Diagnosis not present

## 2020-12-18 DIAGNOSIS — I110 Hypertensive heart disease with heart failure: Secondary | ICD-10-CM | POA: Diagnosis not present

## 2020-12-18 DIAGNOSIS — Z48298 Encounter for aftercare following other organ transplant: Secondary | ICD-10-CM | POA: Diagnosis not present

## 2020-12-18 DIAGNOSIS — I2510 Atherosclerotic heart disease of native coronary artery without angina pectoris: Secondary | ICD-10-CM | POA: Diagnosis not present

## 2020-12-18 DIAGNOSIS — A419 Sepsis, unspecified organism: Secondary | ICD-10-CM | POA: Diagnosis not present

## 2020-12-18 DIAGNOSIS — N170 Acute kidney failure with tubular necrosis: Secondary | ICD-10-CM | POA: Diagnosis not present

## 2020-12-18 DIAGNOSIS — D649 Anemia, unspecified: Secondary | ICD-10-CM | POA: Diagnosis not present

## 2020-12-22 DIAGNOSIS — D649 Anemia, unspecified: Secondary | ICD-10-CM | POA: Diagnosis not present

## 2020-12-22 DIAGNOSIS — I110 Hypertensive heart disease with heart failure: Secondary | ICD-10-CM | POA: Diagnosis not present

## 2020-12-22 DIAGNOSIS — I5023 Acute on chronic systolic (congestive) heart failure: Secondary | ICD-10-CM | POA: Diagnosis not present

## 2020-12-22 DIAGNOSIS — Z48298 Encounter for aftercare following other organ transplant: Secondary | ICD-10-CM | POA: Diagnosis not present

## 2020-12-22 DIAGNOSIS — N170 Acute kidney failure with tubular necrosis: Secondary | ICD-10-CM | POA: Diagnosis not present

## 2020-12-22 DIAGNOSIS — I2510 Atherosclerotic heart disease of native coronary artery without angina pectoris: Secondary | ICD-10-CM | POA: Diagnosis not present

## 2020-12-22 DIAGNOSIS — A419 Sepsis, unspecified organism: Secondary | ICD-10-CM | POA: Diagnosis not present

## 2020-12-22 DIAGNOSIS — I252 Old myocardial infarction: Secondary | ICD-10-CM | POA: Diagnosis not present

## 2020-12-22 DIAGNOSIS — J449 Chronic obstructive pulmonary disease, unspecified: Secondary | ICD-10-CM | POA: Diagnosis not present

## 2020-12-22 DIAGNOSIS — I255 Ischemic cardiomyopathy: Secondary | ICD-10-CM | POA: Diagnosis not present

## 2020-12-23 ENCOUNTER — Institutional Professional Consult (permissible substitution): Payer: Medicare HMO | Admitting: Internal Medicine

## 2020-12-25 ENCOUNTER — Other Ambulatory Visit: Payer: Self-pay | Admitting: *Deleted

## 2020-12-25 DIAGNOSIS — I5023 Acute on chronic systolic (congestive) heart failure: Secondary | ICD-10-CM | POA: Diagnosis not present

## 2020-12-25 DIAGNOSIS — N170 Acute kidney failure with tubular necrosis: Secondary | ICD-10-CM | POA: Diagnosis not present

## 2020-12-25 DIAGNOSIS — I255 Ischemic cardiomyopathy: Secondary | ICD-10-CM | POA: Diagnosis not present

## 2020-12-25 DIAGNOSIS — A419 Sepsis, unspecified organism: Secondary | ICD-10-CM | POA: Diagnosis not present

## 2020-12-25 DIAGNOSIS — I2510 Atherosclerotic heart disease of native coronary artery without angina pectoris: Secondary | ICD-10-CM | POA: Diagnosis not present

## 2020-12-25 DIAGNOSIS — I252 Old myocardial infarction: Secondary | ICD-10-CM | POA: Diagnosis not present

## 2020-12-25 DIAGNOSIS — Z48298 Encounter for aftercare following other organ transplant: Secondary | ICD-10-CM | POA: Diagnosis not present

## 2020-12-25 DIAGNOSIS — I110 Hypertensive heart disease with heart failure: Secondary | ICD-10-CM | POA: Diagnosis not present

## 2020-12-25 DIAGNOSIS — J449 Chronic obstructive pulmonary disease, unspecified: Secondary | ICD-10-CM | POA: Diagnosis not present

## 2020-12-25 DIAGNOSIS — D649 Anemia, unspecified: Secondary | ICD-10-CM | POA: Diagnosis not present

## 2020-12-25 MED ORDER — ATORVASTATIN CALCIUM 80 MG PO TABS
80.0000 mg | ORAL_TABLET | Freq: Every day | ORAL | 1 refills | Status: DC
Start: 2020-12-25 — End: 2021-07-10

## 2020-12-29 ENCOUNTER — Encounter: Payer: Self-pay | Admitting: *Deleted

## 2020-12-29 ENCOUNTER — Telehealth: Payer: Self-pay | Admitting: *Deleted

## 2020-12-29 NOTE — Telephone Encounter (Signed)
Laurine Blazer, LPN  47/08/6149  8:34 PM EST Back to Top    Patient notified via letter.  Copy to pcp.

## 2020-12-29 NOTE — Telephone Encounter (Signed)
-----   Message from Arnoldo Lenis, MD sent at 12/26/2020 11:24 AM EST ----- Normal labs  Zandra Abts MD

## 2020-12-31 ENCOUNTER — Telehealth: Payer: Self-pay | Admitting: Cardiology

## 2020-12-31 ENCOUNTER — Other Ambulatory Visit: Payer: Self-pay | Admitting: *Deleted

## 2020-12-31 MED ORDER — CLOPIDOGREL BISULFATE 75 MG PO TABS: 75.0000 mg | ORAL_TABLET | Freq: Every day | ORAL | 1 refills | Status: DC

## 2020-12-31 MED ORDER — JARDIANCE 10 MG PO TABS: 10.0000 mg | ORAL_TABLET | Freq: Every day | ORAL | 3 refills | Status: DC

## 2020-12-31 MED ORDER — ENTRESTO 24-26 MG PO TABS: 1.0000 | ORAL_TABLET | Freq: Two times a day (BID) | ORAL | 3 refills | Status: DC

## 2020-12-31 NOTE — Telephone Encounter (Signed)
*  STAT* If patient is at the pharmacy, call can be transferred to refill team.   1. Which medications need to be refilled? (please list name of each medication and dose if known)   ENTRESTO 24-26 MG [[id number]]    JARDIANCE 10 MG TABS tablet [[id number]]   2. Which pharmacy/location (including street and city if local pharmacy) is medication to be sent to?  [facility]  3. Do they need a 30 day or 90 day supply?   90 day

## 2020-12-31 NOTE — Telephone Encounter (Signed)
Done

## 2021-01-01 DIAGNOSIS — Z48298 Encounter for aftercare following other organ transplant: Secondary | ICD-10-CM | POA: Diagnosis not present

## 2021-01-01 DIAGNOSIS — I2510 Atherosclerotic heart disease of native coronary artery without angina pectoris: Secondary | ICD-10-CM | POA: Diagnosis not present

## 2021-01-01 DIAGNOSIS — I252 Old myocardial infarction: Secondary | ICD-10-CM | POA: Diagnosis not present

## 2021-01-01 DIAGNOSIS — D649 Anemia, unspecified: Secondary | ICD-10-CM | POA: Diagnosis not present

## 2021-01-01 DIAGNOSIS — N170 Acute kidney failure with tubular necrosis: Secondary | ICD-10-CM | POA: Diagnosis not present

## 2021-01-01 DIAGNOSIS — J449 Chronic obstructive pulmonary disease, unspecified: Secondary | ICD-10-CM | POA: Diagnosis not present

## 2021-01-01 DIAGNOSIS — A419 Sepsis, unspecified organism: Secondary | ICD-10-CM | POA: Diagnosis not present

## 2021-01-01 DIAGNOSIS — I110 Hypertensive heart disease with heart failure: Secondary | ICD-10-CM | POA: Diagnosis not present

## 2021-01-01 DIAGNOSIS — I255 Ischemic cardiomyopathy: Secondary | ICD-10-CM | POA: Diagnosis not present

## 2021-01-01 DIAGNOSIS — I5023 Acute on chronic systolic (congestive) heart failure: Secondary | ICD-10-CM | POA: Diagnosis not present

## 2021-01-07 DIAGNOSIS — N170 Acute kidney failure with tubular necrosis: Secondary | ICD-10-CM | POA: Diagnosis not present

## 2021-01-07 DIAGNOSIS — Z48298 Encounter for aftercare following other organ transplant: Secondary | ICD-10-CM | POA: Diagnosis not present

## 2021-01-07 DIAGNOSIS — I5023 Acute on chronic systolic (congestive) heart failure: Secondary | ICD-10-CM | POA: Diagnosis not present

## 2021-01-07 DIAGNOSIS — J449 Chronic obstructive pulmonary disease, unspecified: Secondary | ICD-10-CM | POA: Diagnosis not present

## 2021-01-07 DIAGNOSIS — A419 Sepsis, unspecified organism: Secondary | ICD-10-CM | POA: Diagnosis not present

## 2021-01-07 DIAGNOSIS — I110 Hypertensive heart disease with heart failure: Secondary | ICD-10-CM | POA: Diagnosis not present

## 2021-01-07 DIAGNOSIS — I2510 Atherosclerotic heart disease of native coronary artery without angina pectoris: Secondary | ICD-10-CM | POA: Diagnosis not present

## 2021-01-07 DIAGNOSIS — D649 Anemia, unspecified: Secondary | ICD-10-CM | POA: Diagnosis not present

## 2021-01-07 DIAGNOSIS — I255 Ischemic cardiomyopathy: Secondary | ICD-10-CM | POA: Diagnosis not present

## 2021-01-07 DIAGNOSIS — I252 Old myocardial infarction: Secondary | ICD-10-CM | POA: Diagnosis not present

## 2021-01-14 DIAGNOSIS — N170 Acute kidney failure with tubular necrosis: Secondary | ICD-10-CM | POA: Diagnosis not present

## 2021-01-14 DIAGNOSIS — I252 Old myocardial infarction: Secondary | ICD-10-CM | POA: Diagnosis not present

## 2021-01-14 DIAGNOSIS — I110 Hypertensive heart disease with heart failure: Secondary | ICD-10-CM | POA: Diagnosis not present

## 2021-01-14 DIAGNOSIS — I5023 Acute on chronic systolic (congestive) heart failure: Secondary | ICD-10-CM | POA: Diagnosis not present

## 2021-01-14 DIAGNOSIS — A419 Sepsis, unspecified organism: Secondary | ICD-10-CM | POA: Diagnosis not present

## 2021-01-14 DIAGNOSIS — I2510 Atherosclerotic heart disease of native coronary artery without angina pectoris: Secondary | ICD-10-CM | POA: Diagnosis not present

## 2021-01-14 DIAGNOSIS — I255 Ischemic cardiomyopathy: Secondary | ICD-10-CM | POA: Diagnosis not present

## 2021-01-14 DIAGNOSIS — J449 Chronic obstructive pulmonary disease, unspecified: Secondary | ICD-10-CM | POA: Diagnosis not present

## 2021-01-14 DIAGNOSIS — Z48298 Encounter for aftercare following other organ transplant: Secondary | ICD-10-CM | POA: Diagnosis not present

## 2021-01-14 DIAGNOSIS — D649 Anemia, unspecified: Secondary | ICD-10-CM | POA: Diagnosis not present

## 2021-01-16 DIAGNOSIS — G720 Drug-induced myopathy: Secondary | ICD-10-CM | POA: Diagnosis not present

## 2021-01-16 DIAGNOSIS — I509 Heart failure, unspecified: Secondary | ICD-10-CM | POA: Diagnosis not present

## 2021-01-16 DIAGNOSIS — R7303 Prediabetes: Secondary | ICD-10-CM | POA: Diagnosis not present

## 2021-01-16 DIAGNOSIS — M109 Gout, unspecified: Secondary | ICD-10-CM | POA: Diagnosis not present

## 2021-01-16 DIAGNOSIS — I10 Essential (primary) hypertension: Secondary | ICD-10-CM | POA: Diagnosis not present

## 2021-01-16 DIAGNOSIS — I442 Atrioventricular block, complete: Secondary | ICD-10-CM | POA: Diagnosis not present

## 2021-01-16 DIAGNOSIS — E782 Mixed hyperlipidemia: Secondary | ICD-10-CM | POA: Diagnosis not present

## 2021-01-16 DIAGNOSIS — I2510 Atherosclerotic heart disease of native coronary artery without angina pectoris: Secondary | ICD-10-CM | POA: Diagnosis not present

## 2021-01-16 DIAGNOSIS — I719 Aortic aneurysm of unspecified site, without rupture: Secondary | ICD-10-CM | POA: Diagnosis not present

## 2021-01-20 DIAGNOSIS — I2510 Atherosclerotic heart disease of native coronary artery without angina pectoris: Secondary | ICD-10-CM | POA: Diagnosis not present

## 2021-01-20 DIAGNOSIS — A419 Sepsis, unspecified organism: Secondary | ICD-10-CM | POA: Diagnosis not present

## 2021-01-20 DIAGNOSIS — I110 Hypertensive heart disease with heart failure: Secondary | ICD-10-CM | POA: Diagnosis not present

## 2021-01-20 DIAGNOSIS — I5023 Acute on chronic systolic (congestive) heart failure: Secondary | ICD-10-CM | POA: Diagnosis not present

## 2021-01-20 DIAGNOSIS — J449 Chronic obstructive pulmonary disease, unspecified: Secondary | ICD-10-CM | POA: Diagnosis not present

## 2021-01-20 DIAGNOSIS — Z48298 Encounter for aftercare following other organ transplant: Secondary | ICD-10-CM | POA: Diagnosis not present

## 2021-01-20 DIAGNOSIS — D649 Anemia, unspecified: Secondary | ICD-10-CM | POA: Diagnosis not present

## 2021-01-20 DIAGNOSIS — I252 Old myocardial infarction: Secondary | ICD-10-CM | POA: Diagnosis not present

## 2021-01-20 DIAGNOSIS — N170 Acute kidney failure with tubular necrosis: Secondary | ICD-10-CM | POA: Diagnosis not present

## 2021-01-20 DIAGNOSIS — I255 Ischemic cardiomyopathy: Secondary | ICD-10-CM | POA: Diagnosis not present

## 2021-01-27 DIAGNOSIS — L905 Scar conditions and fibrosis of skin: Secondary | ICD-10-CM | POA: Diagnosis not present

## 2021-01-29 ENCOUNTER — Ambulatory Visit: Payer: Medicare HMO | Admitting: Cardiology

## 2021-01-29 ENCOUNTER — Encounter: Payer: Self-pay | Admitting: Cardiology

## 2021-01-29 VITALS — BP 132/60 | HR 64 | Ht 66.0 in | Wt 218.2 lb

## 2021-01-29 DIAGNOSIS — E782 Mixed hyperlipidemia: Secondary | ICD-10-CM | POA: Diagnosis not present

## 2021-01-29 DIAGNOSIS — I442 Atrioventricular block, complete: Secondary | ICD-10-CM | POA: Diagnosis not present

## 2021-01-29 DIAGNOSIS — I2510 Atherosclerotic heart disease of native coronary artery without angina pectoris: Secondary | ICD-10-CM

## 2021-01-29 DIAGNOSIS — I5022 Chronic systolic (congestive) heart failure: Secondary | ICD-10-CM | POA: Diagnosis not present
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
## 2021-01-29 NOTE — Patient Instructions (Signed)
Medication Instructions:  Continue all current medications.  Labwork: none  Testing/Procedures: none  Follow-Up: 3 months   Any Other Special Instructions Will Be Listed Below (If Applicable). List of pcp given today.   If you need a refill on your cardiac medications before your next appointment, please call your pharmacy.

## 2021-03-02 ENCOUNTER — Ambulatory Visit (INDEPENDENT_AMBULATORY_CARE_PROVIDER_SITE_OTHER): Payer: Medicare HMO

## 2021-03-02 DIAGNOSIS — I442 Atrioventricular block, complete: Secondary | ICD-10-CM

## 2021-03-02 LAB — CUP PACEART REMOTE DEVICE CHECK
Battery Remaining Longevity: 155 mo
Battery Voltage: 3.2 V
Brady Statistic AP VP Percent: 3.97 %
Brady Statistic AP VS Percent: 0 %
Brady Statistic AS VP Percent: 95.97 %
Brady Statistic AS VS Percent: 0.07 %
Brady Statistic RA Percent Paced: 3.97 %
Brady Statistic RV Percent Paced: 99.93 %
Date Time Interrogation Session: 20230115223847
Implantable Lead Implant Date: 20221014
Implantable Lead Implant Date: 20221014
Implantable Lead Location: 753859
Implantable Lead Location: 753860
Implantable Lead Model: 3830
Implantable Lead Model: 5076
Implantable Pulse Generator Implant Date: 20221014
Lead Channel Impedance Value: 323 Ohm
Lead Channel Impedance Value: 361 Ohm
Lead Channel Impedance Value: 513 Ohm
Lead Channel Impedance Value: 665 Ohm
Lead Channel Pacing Threshold Amplitude: 0.5 V
Lead Channel Pacing Threshold Amplitude: 0.875 V
Lead Channel Pacing Threshold Pulse Width: 0.4 ms
Lead Channel Pacing Threshold Pulse Width: 0.4 ms
Lead Channel Sensing Intrinsic Amplitude: 4.625 mV
Lead Channel Sensing Intrinsic Amplitude: 4.625 mV
Lead Channel Sensing Intrinsic Amplitude: 7.375 mV
Lead Channel Setting Pacing Amplitude: 1.5 V
Lead Channel Setting Pacing Amplitude: 2 V
Lead Channel Setting Pacing Pulse Width: 0.4 ms
Lead Channel Setting Sensing Sensitivity: 0.9 mV

## 2021-03-03 ENCOUNTER — Encounter: Payer: Self-pay | Admitting: Cardiology

## 2021-03-03 ENCOUNTER — Other Ambulatory Visit: Payer: Self-pay

## 2021-03-03 ENCOUNTER — Ambulatory Visit: Payer: Medicare HMO | Admitting: Cardiology

## 2021-03-03 VITALS — BP 98/64 | HR 86 | Ht 66.0 in | Wt 223.4 lb

## 2021-03-03 DIAGNOSIS — I442 Atrioventricular block, complete: Secondary | ICD-10-CM

## 2021-03-03 DIAGNOSIS — G4733 Obstructive sleep apnea (adult) (pediatric): Secondary | ICD-10-CM
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
## 2021-03-10 ENCOUNTER — Encounter: Payer: Self-pay | Admitting: Cardiology

## 2021-03-11 ENCOUNTER — Telehealth: Payer: Self-pay | Admitting: *Deleted

## 2021-03-11 NOTE — Telephone Encounter (Signed)
° °  Name: Jonathan Garner.  DOB: [birth year]  MRN: [id number]   Primary Cardiologist: Carlyle Dolly, MD  Chart reviewed as part of pre-operative protocol coverage. Patient was contacted 03/11/2021 in reference to pre-operative risk assessment for pending surgery as outlined below.  Jonathan Garner. was last seen on 01/29/21 by Dr. Harl Bowie.  Since that day, Jonathan Garner. has done well.  He had PCI in 09/2020 with DES to LAD in the setting of NSTEMI and cardiogenic shock. 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 He can complete more than 4.0 METS without angina.   Per Dr. Nelly Laurence clinic note, he may hold plavix prior to hand surgery after Feb 2023. We recommend continuation of ASA without interruption.   Therefore, based on ACC/AHA guidelines, the patient would be at acceptable risk for the planned procedure without further cardiovascular testing.   The patient was advised that if he develops new symptoms prior to surgery to contact our office to arrange for a follow-up visit, and he verbalized understanding.  I will route this recommendation to the requesting party via Epic fax function and remove from pre-op pool. Please call with questions.  Ledora Bottcher, PA 03/11/2021, 4:52 PM

## 2021-03-11 NOTE — Telephone Encounter (Signed)
° °  Pre-operative Risk Assessment    Patient Name: Jonathan Garner.  DOB: September 29, 1945 MRN: [id number]      Request for Surgical Clearance    Procedure:   RIGHT HAND SURGERY ; LEFT MESSAGE FOR SURGEON'S OFFICE TO PLEASE CALL BACK AND CLARIFY PROCEDURE BEING DONE  Date of Surgery:  Clearance TBD                                 Surgeon:  DR. Trellis Moment Surgeon's Group or Practice Name:  [facility] Phone number:  [phone] Fax number:  [phone]   Type of Clearance Requested:   - Medical  - Pharmacy:  Hold Aspirin and Clopidogrel (Plavix)     Type of Anesthesia:  General    Additional requests/questions:    Jiles Prows   03/11/2021, 2:19 PM

## 2021-03-12 NOTE — Progress Notes (Signed)
Remote pacemaker transmission.   

## 2021-03-17 ENCOUNTER — Encounter: Payer: Medicare HMO | Admitting: Internal Medicine

## 2021-03-18 DIAGNOSIS — G4733 Obstructive sleep apnea (adult) (pediatric): Secondary | ICD-10-CM | POA: Diagnosis not present

## 2021-03-30 ENCOUNTER — Other Ambulatory Visit: Payer: Self-pay | Admitting: Student

## 2021-03-30 ENCOUNTER — Other Ambulatory Visit: Payer: Self-pay | Admitting: Internal Medicine

## 2021-03-30 NOTE — Telephone Encounter (Signed)
Next Visit: nothing on file  Last Visit: 05/19/2020  Last Fill: 11/17/2020  DX: Chronic tophaceous gout   Current Dose per office note 05/19/2020: allopurinol was increased to 450 mg daily.  Labs: 01/16/2021 WNL  Okay to refill Allopurinol? When do you want patient to follow up?

## 2021-04-01 NOTE — Telephone Encounter (Signed)
Okay to refill, recommend he follow up in April ~1 yr f/u on stable treatment dose.

## 2021-04-15 DIAGNOSIS — G4733 Obstructive sleep apnea (adult) (pediatric): Secondary | ICD-10-CM | POA: Diagnosis not present

## 2021-05-04 ENCOUNTER — Ambulatory Visit: Payer: Medicare HMO | Admitting: Cardiology

## 2021-05-20 ENCOUNTER — Ambulatory Visit: Payer: Medicare HMO | Admitting: Internal Medicine

## 2021-05-21 DIAGNOSIS — I10 Essential (primary) hypertension: Secondary | ICD-10-CM | POA: Diagnosis not present

## 2021-05-21 DIAGNOSIS — G4733 Obstructive sleep apnea (adult) (pediatric): Secondary | ICD-10-CM | POA: Diagnosis not present

## 2021-05-21 DIAGNOSIS — E785 Hyperlipidemia, unspecified: Secondary | ICD-10-CM | POA: Diagnosis not present

## 2021-05-21 DIAGNOSIS — I2510 Atherosclerotic heart disease of native coronary artery without angina pectoris: Secondary | ICD-10-CM | POA: Diagnosis not present

## 2021-05-21 DIAGNOSIS — I5032 Chronic diastolic (congestive) heart failure: Secondary | ICD-10-CM | POA: Diagnosis not present

## 2021-05-21 DIAGNOSIS — Z950 Presence of cardiac pacemaker: Secondary | ICD-10-CM | POA: Diagnosis not present

## 2021-05-23 ENCOUNTER — Other Ambulatory Visit: Payer: Self-pay | Admitting: Student

## 2021-05-26 DIAGNOSIS — I130 Hypertensive heart and chronic kidney disease with heart failure and stage 1 through stage 4 chronic kidney disease, or unspecified chronic kidney disease: Secondary | ICD-10-CM | POA: Diagnosis not present

## 2021-05-26 DIAGNOSIS — N189 Chronic kidney disease, unspecified: Secondary | ICD-10-CM | POA: Diagnosis not present

## 2021-05-26 DIAGNOSIS — I5032 Chronic diastolic (congestive) heart failure: Secondary | ICD-10-CM | POA: Diagnosis not present

## 2021-05-26 DIAGNOSIS — M24541 Contracture, right hand: Secondary | ICD-10-CM | POA: Diagnosis not present

## 2021-05-26 DIAGNOSIS — I2510 Atherosclerotic heart disease of native coronary artery without angina pectoris: Secondary | ICD-10-CM | POA: Diagnosis not present

## 2021-05-26 DIAGNOSIS — E785 Hyperlipidemia, unspecified: Secondary | ICD-10-CM | POA: Diagnosis not present

## 2021-05-26 DIAGNOSIS — L905 Scar conditions and fibrosis of skin: Secondary | ICD-10-CM | POA: Diagnosis not present

## 2021-06-01 ENCOUNTER — Ambulatory Visit (INDEPENDENT_AMBULATORY_CARE_PROVIDER_SITE_OTHER): Payer: Medicare HMO

## 2021-06-01 DIAGNOSIS — I442 Atrioventricular block, complete: Secondary | ICD-10-CM

## 2021-06-02 LAB — CUP PACEART REMOTE DEVICE CHECK
Battery Remaining Longevity: 147 mo
Battery Voltage: 3.16 V
Brady Statistic AP VP Percent: 5.46 %
Brady Statistic AP VS Percent: 0 %
Brady Statistic AS VP Percent: 91.28 %
Brady Statistic AS VS Percent: 3.25 %
Brady Statistic RA Percent Paced: 5.95 %
Brady Statistic RV Percent Paced: 96.75 %
Date Time Interrogation Session: 20230417035428
Implantable Lead Implant Date: 20221014
Implantable Lead Implant Date: 20221014
Implantable Lead Location: 753859
Implantable Lead Location: 753860
Implantable Lead Model: 3830
Implantable Lead Model: 5076
Implantable Pulse Generator Implant Date: 20221014
Lead Channel Impedance Value: 323 Ohm
Lead Channel Impedance Value: 361 Ohm
Lead Channel Impedance Value: 475 Ohm
Lead Channel Impedance Value: 513 Ohm
Lead Channel Pacing Threshold Amplitude: 0.625 V
Lead Channel Pacing Threshold Amplitude: 0.75 V
Lead Channel Pacing Threshold Pulse Width: 0.4 ms
Lead Channel Pacing Threshold Pulse Width: 0.4 ms
Lead Channel Sensing Intrinsic Amplitude: 25.25 mV
Lead Channel Sensing Intrinsic Amplitude: 25.25 mV
Lead Channel Sensing Intrinsic Amplitude: 5 mV
Lead Channel Sensing Intrinsic Amplitude: 5 mV
Lead Channel Setting Pacing Amplitude: 1.5 V
Lead Channel Setting Pacing Amplitude: 2 V
Lead Channel Setting Pacing Pulse Width: 0.4 ms
Lead Channel Setting Sensing Sensitivity: 0.9 mV

## 2021-06-17 DIAGNOSIS — M25641 Stiffness of right hand, not elsewhere classified: Secondary | ICD-10-CM | POA: Diagnosis not present

## 2021-06-17 DIAGNOSIS — M25441 Effusion, right hand: Secondary | ICD-10-CM | POA: Diagnosis not present

## 2021-06-17 DIAGNOSIS — R531 Weakness: Secondary | ICD-10-CM | POA: Diagnosis not present

## 2021-06-17 DIAGNOSIS — S61431D Puncture wound without foreign body of right hand, subsequent encounter: Secondary | ICD-10-CM | POA: Diagnosis not present

## 2021-06-17 DIAGNOSIS — M25541 Pain in joints of right hand: Secondary | ICD-10-CM | POA: Diagnosis not present

## 2021-06-17 NOTE — Progress Notes (Signed)
Remote pacemaker transmission.   

## 2021-06-22 ENCOUNTER — Ambulatory Visit: Payer: Medicare HMO | Admitting: Cardiology

## 2021-06-22 DIAGNOSIS — M25441 Effusion, right hand: Secondary | ICD-10-CM | POA: Diagnosis not present

## 2021-06-22 DIAGNOSIS — S61431D Puncture wound without foreign body of right hand, subsequent encounter: Secondary | ICD-10-CM | POA: Diagnosis not present

## 2021-06-22 DIAGNOSIS — R531 Weakness: Secondary | ICD-10-CM | POA: Diagnosis not present

## 2021-06-22 DIAGNOSIS — M25641 Stiffness of right hand, not elsewhere classified: Secondary | ICD-10-CM | POA: Diagnosis not present

## 2021-06-22 DIAGNOSIS — M25541 Pain in joints of right hand: Secondary | ICD-10-CM | POA: Diagnosis not present

## 2021-06-23 DIAGNOSIS — M25441 Effusion, right hand: Secondary | ICD-10-CM | POA: Diagnosis not present

## 2021-06-23 DIAGNOSIS — S61431D Puncture wound without foreign body of right hand, subsequent encounter: Secondary | ICD-10-CM | POA: Diagnosis not present

## 2021-06-23 DIAGNOSIS — M25641 Stiffness of right hand, not elsewhere classified: Secondary | ICD-10-CM | POA: Diagnosis not present

## 2021-06-23 DIAGNOSIS — R531 Weakness: Secondary | ICD-10-CM | POA: Diagnosis not present

## 2021-06-23 DIAGNOSIS — M25541 Pain in joints of right hand: Secondary | ICD-10-CM | POA: Diagnosis not present

## 2021-06-24 ENCOUNTER — Encounter: Payer: Self-pay | Admitting: Cardiology

## 2021-06-24 ENCOUNTER — Ambulatory Visit: Payer: Medicare HMO | Admitting: Cardiology

## 2021-06-24 VITALS — BP 100/55 | HR 86 | Ht 66.0 in | Wt 223.0 lb

## 2021-06-24 DIAGNOSIS — I442 Atrioventricular block, complete: Secondary | ICD-10-CM

## 2021-06-24 DIAGNOSIS — S61431D Puncture wound without foreign body of right hand, subsequent encounter: Secondary | ICD-10-CM | POA: Diagnosis not present

## 2021-06-24 DIAGNOSIS — I2510 Atherosclerotic heart disease of native coronary artery without angina pectoris: Secondary | ICD-10-CM

## 2021-06-24 DIAGNOSIS — E782 Mixed hyperlipidemia: Secondary | ICD-10-CM

## 2021-06-24 DIAGNOSIS — M25641 Stiffness of right hand, not elsewhere classified: Secondary | ICD-10-CM | POA: Diagnosis not present

## 2021-06-24 DIAGNOSIS — M25441 Effusion, right hand: Secondary | ICD-10-CM | POA: Diagnosis not present

## 2021-06-24 DIAGNOSIS — M25541 Pain in joints of right hand: Secondary | ICD-10-CM | POA: Diagnosis not present

## 2021-06-24 DIAGNOSIS — R531 Weakness: Secondary | ICD-10-CM | POA: Diagnosis not present

## 2021-06-24 NOTE — Patient Instructions (Signed)
Medication Instructions:  ?No changes ? ?Labwork: ?None ? ?Testing/Procedures: ?None ? ?Follow-Up: ?Follow up with Dr. Harl Bowie in 6 months.  ? ?Any Other Special Instructions Will Be Listed Below (If Applicable). ? ? ? ? ?If you need a refill on your cardiac medications before your next appointment, please call your pharmacy. ? ?
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
## 2021-06-29 DIAGNOSIS — M25441 Effusion, right hand: Secondary | ICD-10-CM | POA: Diagnosis not present

## 2021-06-29 DIAGNOSIS — S61431D Puncture wound without foreign body of right hand, subsequent encounter: Secondary | ICD-10-CM | POA: Diagnosis not present

## 2021-06-29 DIAGNOSIS — M25541 Pain in joints of right hand: Secondary | ICD-10-CM | POA: Diagnosis not present

## 2021-06-29 DIAGNOSIS — M25641 Stiffness of right hand, not elsewhere classified: Secondary | ICD-10-CM | POA: Diagnosis not present

## 2021-06-29 DIAGNOSIS — R531 Weakness: Secondary | ICD-10-CM | POA: Diagnosis not present

## 2021-06-30 DIAGNOSIS — M25641 Stiffness of right hand, not elsewhere classified: Secondary | ICD-10-CM | POA: Diagnosis not present

## 2021-06-30 DIAGNOSIS — M25541 Pain in joints of right hand: Secondary | ICD-10-CM | POA: Diagnosis not present

## 2021-06-30 DIAGNOSIS — S61431D Puncture wound without foreign body of right hand, subsequent encounter: Secondary | ICD-10-CM | POA: Diagnosis not present

## 2021-06-30 DIAGNOSIS — M25441 Effusion, right hand: Secondary | ICD-10-CM | POA: Diagnosis not present

## 2021-06-30 DIAGNOSIS — R531 Weakness: Secondary | ICD-10-CM | POA: Diagnosis not present

## 2021-07-06 DIAGNOSIS — R531 Weakness: Secondary | ICD-10-CM | POA: Diagnosis not present

## 2021-07-06 DIAGNOSIS — M25641 Stiffness of right hand, not elsewhere classified: Secondary | ICD-10-CM | POA: Diagnosis not present

## 2021-07-06 DIAGNOSIS — S61431D Puncture wound without foreign body of right hand, subsequent encounter: Secondary | ICD-10-CM | POA: Diagnosis not present

## 2021-07-06 DIAGNOSIS — M25541 Pain in joints of right hand: Secondary | ICD-10-CM | POA: Diagnosis not present

## 2021-07-06 DIAGNOSIS — M25441 Effusion, right hand: Secondary | ICD-10-CM | POA: Diagnosis not present

## 2021-07-10 ENCOUNTER — Other Ambulatory Visit: Payer: Self-pay | Admitting: Cardiology
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
## 2021-07-26 ENCOUNTER — Other Ambulatory Visit: Payer: Self-pay | Admitting: Cardiology

## 2021-08-17 ENCOUNTER — Other Ambulatory Visit: Payer: Self-pay | Admitting: Cardiology

## 2021-08-17 NOTE — Telephone Encounter (Signed)
Arnoldo Lenis, MD  Bernita Raisin, RN Should be '20mg'$  daily   Zandra Abts MD        Previous Messages    ----- Message -----  From: Bernita Raisin, RN  Sent: 08/17/2021  10:58 AM EDT  To: Arnoldo Lenis, MD  Subject: torsemide dose                                 Pharmacy asking for refill, says 60 mg qd. I only see 20 mg qd. Can you clarify dose, thanks

## 2021-08-20 DIAGNOSIS — H25813 Combined forms of age-related cataract, bilateral: Secondary | ICD-10-CM | POA: Diagnosis not present

## 2021-08-20 DIAGNOSIS — Z0100 Encounter for examination of eyes and vision without abnormal findings: Secondary | ICD-10-CM | POA: Diagnosis not present

## 2021-08-20 DIAGNOSIS — H5200 Hypermetropia, unspecified eye: Secondary | ICD-10-CM | POA: Diagnosis not present

## 2021-08-24 ENCOUNTER — Other Ambulatory Visit: Payer: Self-pay | Admitting: Internal Medicine

## 2021-08-25 ENCOUNTER — Other Ambulatory Visit: Payer: Self-pay

## 2021-08-25 MED ORDER — SPIRONOLACTONE 25 MG PO TABS: 12.5000 mg | ORAL_TABLET | Freq: Every day | ORAL | 3 refills | Status: DC

## 2021-08-31 ENCOUNTER — Telehealth: Payer: Self-pay

## 2021-08-31 ENCOUNTER — Ambulatory Visit (INDEPENDENT_AMBULATORY_CARE_PROVIDER_SITE_OTHER): Payer: Medicare HMO

## 2021-08-31 DIAGNOSIS — I442 Atrioventricular block, complete: Secondary | ICD-10-CM | POA: Diagnosis not present

## 2021-08-31 LAB — CUP PACEART REMOTE DEVICE CHECK
Battery Remaining Longevity: 143 mo
Battery Voltage: 3.1 V
Brady Statistic AP VP Percent: 2.82 %
Brady Statistic AP VS Percent: 0 %
Brady Statistic AS VP Percent: 96.91 %
Brady Statistic AS VS Percent: 0.24 %
Brady Statistic RA Percent Paced: 2.69 %
Brady Statistic RV Percent Paced: 99.46 %
Date Time Interrogation Session: 20230716192143
Implantable Lead Implant Date: 20221014
Implantable Lead Implant Date: 20221014
Implantable Lead Location: 753859
Implantable Lead Location: 753860
Implantable Lead Model: 3830
Implantable Lead Model: 5076
Implantable Pulse Generator Implant Date: 20221014
Lead Channel Impedance Value: 304 Ohm
Lead Channel Impedance Value: 342 Ohm
Lead Channel Impedance Value: 494 Ohm
Lead Channel Impedance Value: 627 Ohm
Lead Channel Pacing Threshold Amplitude: 0.75 V
Lead Channel Pacing Threshold Amplitude: 0.75 V
Lead Channel Pacing Threshold Pulse Width: 0.4 ms
Lead Channel Pacing Threshold Pulse Width: 0.4 ms
Lead Channel Sensing Intrinsic Amplitude: 21 mV
Lead Channel Sensing Intrinsic Amplitude: 21 mV
Lead Channel Sensing Intrinsic Amplitude: 5.375 mV
Lead Channel Sensing Intrinsic Amplitude: 5.375 mV
Lead Channel Setting Pacing Amplitude: 1.75 V
Lead Channel Setting Pacing Amplitude: 2 V
Lead Channel Setting Pacing Pulse Width: 0.4 ms
Lead Channel Setting Sensing Sensitivity: 0.9 mV

## 2021-08-31 NOTE — Telephone Encounter (Signed)
Scheduled remote reviewed. Normal device function.   4 AT/AF events, longest duration 74hrs 40mn, EGM's appear AF/flutter, controlled rates.  Last events 6/30 Burden 5%, no hx of PAF noted, ASA only, route to triage Presenting regular AS/VP  Reviewed with Dr. LQuentin Ore  Appears to be afib/aflutter.    Pt advised.  Appt scheduled with AT for 09/04/2021 to discuss afib and OBennett      Presenting

## 2021-09-04 ENCOUNTER — Encounter: Payer: Self-pay | Admitting: Student

## 2021-09-04 ENCOUNTER — Ambulatory Visit: Payer: Medicare HMO | Admitting: Student

## 2021-09-04 VITALS — BP 110/60 | HR 86 | Ht 66.0 in | Wt 230.8 lb

## 2021-09-04 DIAGNOSIS — I2510 Atherosclerotic heart disease of native coronary artery without angina pectoris: Secondary | ICD-10-CM | POA: Diagnosis not present

## 2021-09-04 DIAGNOSIS — I442 Atrioventricular block, complete: Secondary | ICD-10-CM | POA: Diagnosis not present

## 2021-09-04 DIAGNOSIS — I480 Paroxysmal atrial fibrillation: Secondary | ICD-10-CM

## 2021-09-04 DIAGNOSIS — G4733 Obstructive sleep apnea (adult) (pediatric): Secondary | ICD-10-CM | POA: Diagnosis not present

## 2021-09-04 DIAGNOSIS — I10 Essential (primary) hypertension: Secondary | ICD-10-CM

## 2021-09-04 LAB — CUP PACEART INCLINIC DEVICE CHECK
Battery Remaining Longevity: 143 mo
Battery Voltage: 3.1 V
Brady Statistic AP VP Percent: 4.17 %
Brady Statistic AP VS Percent: 0 %
Brady Statistic AS VP Percent: 94.08 %
Brady Statistic AS VS Percent: 1.73 %
Brady Statistic RA Percent Paced: 4.31 %
Brady Statistic RV Percent Paced: 98.16 %
Date Time Interrogation Session: 20230721095135
Implantable Lead Implant Date: 20221014
Implantable Lead Implant Date: 20221014
Implantable Lead Location: 753859
Implantable Lead Location: 753860
Implantable Lead Model: 3830
Implantable Lead Model: 5076
Implantable Pulse Generator Implant Date: 20221014
Lead Channel Impedance Value: 323 Ohm
Lead Channel Impedance Value: 380 Ohm
Lead Channel Impedance Value: 513 Ohm
Lead Channel Impedance Value: 665 Ohm
Lead Channel Pacing Threshold Amplitude: 0.75 V
Lead Channel Pacing Threshold Amplitude: 0.875 V
Lead Channel Pacing Threshold Pulse Width: 0.4 ms
Lead Channel Pacing Threshold Pulse Width: 0.4 ms
Lead Channel Sensing Intrinsic Amplitude: 15.875 mV
Lead Channel Sensing Intrinsic Amplitude: 21 mV
Lead Channel Sensing Intrinsic Amplitude: 6.125 mV
Lead Channel Sensing Intrinsic Amplitude: 6.375 mV
Lead Channel Setting Pacing Amplitude: 1.75 V
Lead Channel Setting Pacing Amplitude: 2 V
Lead Channel Setting Pacing Pulse Width: 0.4 ms
Lead Channel Setting Sensing Sensitivity: 0.9 mV

## 2021-09-04 MED ORDER — APIXABAN 5 MG PO TABS: 5.0000 mg | ORAL_TABLET | Freq: Two times a day (BID) | ORAL | 3 refills | Status: DC

## 2021-09-04 MED ORDER — METOPROLOL SUCCINATE ER 25 MG PO TB24
25.0000 mg | ORAL_TABLET | Freq: Every day | ORAL | 3 refills | Status: DC
Start: 2021-09-04 — End: 2022-09-14

## 2021-09-04 NOTE — Progress Notes (Signed)
Electrophysiology Office Note Date: 09/04/2021  ID:  [name], DOB 1945-09-24, MRN [id number]  PCP: [name] Primary Cardiologist: Carlyle Dolly, MD Electrophysiologist: Constance Haw, MD   CC: Pacemaker follow-up  Quin Hoop Alika Saladin. is a 76 y.o. male seen today for Will Meredith Leeds, MD for acute visit due to new found atrial fib .  Since last being seen in our clinic the patient reports some mild fatigue and SOB over the past month. No specific illnesses. Has not missed medication.  he denies chest pain, palpitations, dyspnea, PND, orthopnea, nausea, vomiting, dizziness, syncope, edema, weight gain, or early satiety.  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  Past Medical History:  Diagnosis Date   AAA (abdominal aortic aneurysm) ([facility])    CAD (coronary artery disease)    CHF (congestive heart failure) ([facility])    Diverticulosis    Dyspnea    W/ EXERTION    Gout    Gynecomastia    Hx of emphysema    Hyperlipidemia    Hypertension    Lumbar spinal stenosis    OSA (obstructive sleep apnea)    Pedal edema    Past Surgical History:  Procedure Laterality Date   ABDOMINAL AORTIC ANEURYSM REPAIR  2013 and 2018   BACK SURGERY     CARDIAC CATHETERIZATION  04/2016   [place]   ELBOW ARTHROSCOPY Left 2011   I & D EXTREMITY Right 08/23/2020   Procedure: IRRIGATION AND DEBRIDEMENT HAND;  Surgeon: Milly Jakob, MD;  Location: [facility];  Service: Orthopedics;  Laterality: Right;  Wound VAC application   I & D EXTREMITY Right 08/26/2020   Procedure: IRRIGATION AND DEBRIDEMENT RIGHT HAND;  Surgeon: Cindra Presume, MD;  Location: [facility];  Service: Plastics;  Laterality: Right;   I & D EXTREMITY Right 08/29/2020   Procedure: INCISION AND DEBRIDEMENT, HAND;  Surgeon: Cindra Presume, MD;  Location: [facility];  Service: Plastics;  Laterality: Right;   NECK SURGERY     PACEMAKER IMPLANT N/A 11/28/2020   Procedure: PACEMAKER  IMPLANT;  Surgeon: Constance Haw, MD;  Location: [facility];  Service: Cardiovascular;  Laterality: N/A;   THORACIC AORTIC ENDOVASCULAR STENT GRAFT N/A 10/26/2017   Procedure: THORACIC AORTIC ENDOVASCULAR STENT GRAFT WITH ILIAC EXTENSIONS;  Surgeon: Rosetta Posner, MD;  [facility];  Service: Vascular;  Laterality: N/A;  PATIENT WILL NEED SPINAL CORD DRAIN BY ANESTHESIA   VASECTOMY  1976    Current Outpatient Medications  Medication Sig Dispense Refill   acetaminophen (TYLENOL) 500 MG tablet Take 500 mg by mouth every 8 (eight) hours as needed for moderate pain.     albuterol (PROVENTIL HFA;VENTOLIN HFA) 108 (90 Base) MCG/ACT inhaler Inhale 1-2 puffs into the lungs every 6 (six) hours as needed for wheezing or shortness of breath. 1 Inhaler 2   allopurinol (ZYLOPRIM) 300 MG tablet TAKE 1 & 1/2 (ONE & ONE-HALF) TABLETS BY MOUTH ONCE DAILY 45 tablet 2   aspirin EC 81 MG tablet Take 81 mg by mouth daily.     atorvastatin (LIPITOR) 80 MG tablet TAKE 1 TABLET BY MOUTH AT BEDTIME 90 tablet 3   carvedilol (COREG) 6.25 MG tablet TAKE 1 TABLET BY MOUTH TWICE DAILY WITH A MEAL 180 tablet 1   Cholecalciferol (VITAMIN D3) 125 MCG (5000 UT) CAPS Take 5,000 Units by mouth daily.     clopidogrel (PLAVIX) 75 MG tablet Take 1 tablet by mouth  once daily 90 tablet 3   Cyanocobalamin (CVS VITAMIN B-12) 5000 MCG SUBL Place 5,000 mcg under the tongue daily.     JARDIANCE 10 MG TABS tablet Take 1 tablet (10 mg total) by mouth daily. 90 tablet 3   nitroGLYCERIN (NITROSTAT) 0.4 MG SL tablet Place 1 tablet (0.4 mg total) under the tongue every 5 (five) minutes x 3 doses as needed for chest pain. 25 tablet 1   potassium chloride SA (KLOR-CON M) 20 MEQ tablet TAKE 1  BY MOUTH ONCE DAILY 90 tablet 1   sacubitril-valsartan (ENTRESTO) 24-26 MG Take 1 tablet by mouth 2 (two) times daily. 180 tablet 3   spironolactone (ALDACTONE) 25 MG tablet Take 0.5 tablets (12.5 mg total) by mouth daily. 45 tablet 3    torsemide (DEMADEX) 20 MG tablet Take 1 tablet (20 mg total) by mouth daily. 90 tablet 1   No current facility-administered medications for this visit.    Allergies:   Atorvastatin   Social History: Social History   Socioeconomic History   Marital status: Married    Spouse name: Not on file   Number of children: Not on file   Years of education: Not on file   Highest education level: Not on file  Occupational History   Not on file  Tobacco Use   Smoking status: Former    Packs/day: 0.25    Years: 58.00    Total pack years: 14.50    Types: Cigars, Cigarettes    Start date: 11/15/1961    Quit date: 09/15/2020    Years since quitting: 0.9   Smokeless tobacco: Never  Vaping Use   Vaping Use: Never used  Substance and Sexual Activity   Alcohol use: Yes    Comment: occasionally   Drug use: Never   Sexual activity: Not on file  Other Topics Concern   Not on file  Social History Narrative   Not on file   Social Determinants of Health   Financial Resource Strain: Not on file  Food Insecurity: Not on file  Transportation Needs: Not on file  Physical Activity: Not on file  Stress: Not on file  Social Connections: Not on file  Intimate Partner Violence: Not on file    Family History: Family History  Problem Relation Age of Onset   Alzheimer's disease Mother    Heart attack Father    Leukemia Father    Hypertension Father    Gout Maternal Uncle    Hypertension Son      Review of Systems: All other systems reviewed and are otherwise negative except as noted above.  Physical Exam: Vitals:   09/04/21 0905  BP: 110/60  Pulse: 86  SpO2: 96%  Weight: 230 lb 12.8 oz (104.7 kg)  Height: '5\' 6"'$  (1.676 m)     GEN- The patient is well appearing, alert and oriented x 3 today.   HEENT: normocephalic, atraumatic; sclera clear, conjunctiva pink; hearing intact; oropharynx clear; neck supple  Lungs- Clear to ausculation bilaterally, normal work of breathing.  No wheezes,  rales, rhonchi Heart- Regular rate and rhythm, no murmurs, rubs or gallops  GI- soft, non-tender, non-distended, bowel sounds present  Extremities- no clubbing or cyanosis. No edema MS- no significant deformity or atrophy Skin- warm and dry, no rash or lesion; PPM pocket well healed Psych- euthymic mood, full affect Neuro- strength and sensation are intact  PPM Interrogation- reviewed in detail today,  See PACEART report  EKG:  EKG is not ordered today.  Recent  Labs: 11/27/2020: B Natriuretic Peptide 1,193.3; Hemoglobin 11.8; Platelets 197; TSH 2.076 11/28/2020: ALT 13 11/29/2020: Magnesium 2.0 12/05/2020: BUN 24; Creatinine, Ser 1.12; Potassium 4.1; Sodium 139   Wt Readings from Last 3 Encounters:  09/04/21 230 lb 12.8 oz (104.7 kg)  06/24/21 223 lb (101.2 kg)  03/03/21 223 lb 6.4 oz (101.3 kg)     Other studies Reviewed: Additional studies/ records that were reviewed today include: Previous EP office notes, Previous remote checks, Most recent labwork.   Assessment and Plan: 1. CHB s/p Medtronic PPM  Underlying CHB but escape in 69s, very symptomatic with intrinsic testing. Normal PPM function See Claudia Desanctis Art report No changes today  2. Paroxysmal atrial fibrillation.  NSR today by device This patients CHA2DS2-VASc Score and unadjusted Ischemic Stroke Rate (% per year) is at least equal to 4.8 % stroke rate/year from a score of 4 1 point each for [facility], HTN, DM, Vascular=MI/PAD/Aortic Plaque, or Male]  2 points each for [Age > 75, or Stroke/TIA/TE] Start eliquis 5 mg BID. BMET/CBC today.  STOP COREG with labile BP. Start metoprolol 25 mg daily.   3. CAD S/p NSTEMI with PCI to LAD 09/2020 Was recommended for prolonged DAPT.  Will confirm with Dr. Harl Bowie what best regimen he would most recommend now with addition of Eliquis.    Current medicines are reviewed at length with the patient today.    Labs/ tests ordered today include:  Orders Placed This Encounter   Procedures   Basic metabolic panel   CBC    Disposition:   Follow up with EP APP in 4 weeks    Signed, Shirley Friar, PA-C  09/04/2021 9:23 AM  [facility] [address] [place] [address] [phone] (office) [phone] (fax)

## 2021-09-04 NOTE — Patient Instructions (Addendum)
Medication Instructions:  Your physician has recommended you make the following change in your medication:   DISCONTINUE: Carvedilol START: Metoprolol Succinate '25mg'$  daily at bedtime START: Eliquis '5mg'$  twice daily  *If you need a refill on your cardiac medications before your next appointment, please call your pharmacy*   Lab Work: TODAY: BMET, CBC  If you have labs (blood work) drawn today and your tests are completely normal, you will receive your results only by: [place] (if you have MyChart) OR A paper copy in the mail If you have any lab test that is abnormal or we need to change your treatment, we will call you to review the results.   Follow-Up: At [facility], you and your health needs are our priority.  As part of our continuing mission to provide you with exceptional heart care, we have created designated Provider Care Teams.  These Care Teams include your primary Cardiologist (physician) and Advanced Practice Providers (APPs -  Physician Assistants and Nurse Practitioners) who all work together to provide you with the care you need, when you need it.   Your next appointment:   10/02/2021   Apixaban Tablets What is this medication? APIXABAN (a PIX a ban) prevents or treats blood clots. It is also used to lower the risk of stroke in people with AFib (atrial fibrillation). It belongs to a group of medications called blood thinners. This medicine may be used for other purposes; ask your health care provider or pharmacist if you have questions. COMMON BRAND NAME(S): Eliquis What should I tell my care team before I take this medication? They need to know if you have any of these conditions: Antiphospholipid antibody syndrome Bleeding disorder History of bleeding in the brain History of blood clots History of stomach bleeding Kidney disease Liver disease Mechanical heart valve Spinal surgery An unusual or allergic reaction to apixaban, other medications, foods,  dyes, or preservatives Pregnant or trying to get pregnant Breast-feeding How should I use this medication? Take this medication by mouth. For your therapy to work as well as possible, take each dose exactly as prescribed on the prescription label. Do not skip doses. Skipping doses or stopping this medication can increase your risk of a blood clot or stroke. Keep taking this medication unless your care team tells you to stop. Take it as directed on the prescription label at the same time every day. You can take it with or without food. If it upsets your stomach, take it with food. A special MedGuide will be given to you by the pharmacist with each prescription and refill. Be sure to read this information carefully each time. Talk to your care team about the use of this medication in children. Special care may be needed. Overdosage: If you think you have taken too much of this medicine contact a poison control center or emergency room at once. NOTE: This medicine is only for you. Do not share this medicine with others. What if I miss a dose? If you miss a dose, take it as soon as you can. If it is almost time for your next dose, take only that dose. Do not take double or extra doses. What may interact with this medication? This medication may interact with the following: Aspirin and aspirin-like medications Certain medications for fungal infections like itraconazole and ketoconazole Certain medications for seizures like carbamazepine and phenytoin Certain medications for blood clots like enoxaparin, dalteparin, heparin, and warfarin Clarithromycin NSAIDs, medications for pain and inflammation, like ibuprofen or naproxen  Rifampin Ritonavir [place]'s wort This list may not describe all possible interactions. Give your health care provider a list of all the medicines, herbs, non-prescription drugs, or dietary supplements you use. Also tell them if you smoke, drink alcohol, or use illegal drugs.  Some items may interact with your medicine. What should I watch for while using this medication? Visit your healthcare professional for regular checks on your progress. You may need blood work done while you are taking this medication. Your condition will be monitored carefully while you are receiving this medication. It is important not to miss any appointments. Avoid sports and activities that might cause injury while you are using this medication. Severe falls or injuries can cause unseen bleeding. Be careful when using sharp tools or knives. Consider using an Copy. Take special care brushing or flossing your teeth. Report any injuries, bruising, or red spots on the skin to your healthcare professional. If you are going to need surgery or other procedure, tell your healthcare professional that you are taking this medication. Wear a medical ID bracelet or chain. Carry a card that describes your disease and details of your medication and dosage times. What side effects may I notice from receiving this medication? Side effects that you should report to your care team as soon as possible: Allergic reactions--skin rash, itching, hives, swelling of the face, lips, tongue, or throat Bleeding--bloody or black, tar-like stools, vomiting blood or brown material that looks like coffee grounds, red or dark brown urine, small red or purple spots on the skin, unusual bruising or bleeding Bleeding in the brain--severe headache, stiff neck, confusion, dizziness, change in vision, numbness or weakness of the face, arm, or leg, trouble speaking, trouble walking, vomiting Heavy periods This list may not describe all possible side effects. Call your doctor for medical advice about side effects. You may report side effects to FDA at 1-800-FDA-1088. Where should I keep my medication? Keep out of the reach of children and pets. Store at room temperature between 20 and 25 degrees C (68 and 77 degrees F). Get rid  of any unused medication after the expiration date. To get rid of medications that are no longer needed or expired: Take the medication to a medication take-back program. Check with your pharmacy or law enforcement to find a location. If you cannot return the medication, check the label or package insert to see if the medication should be thrown out in the garbage or flushed down the toilet. If you are not sure, ask your care team. If it is safe to put in the trash, empty the medication out of the container. Mix the medication with cat litter, dirt, coffee grounds, or other unwanted substance. Seal the mixture in a bag or container. Put it in the trash. NOTE: This sheet is a summary. It may not cover all possible information. If you have questions about this medicine, talk to your doctor, pharmacist, or health care provider.  2023 Elsevier/Gold Standard (2020-02-29 00:00:00)

## 2021-09-05 LAB — BASIC METABOLIC PANEL
BUN/Creatinine Ratio: 16 (ref 10–24)
BUN: 17 mg/dL (ref 8–27)
CO2: 23 mmol/L (ref 20–29)
Calcium: 9.8 mg/dL (ref 8.6–10.2)
Chloride: 101 mmol/L (ref 96–106)
Creatinine, Ser: 1.04 mg/dL (ref 0.76–1.27)
Glucose: 102 mg/dL — ABNORMAL HIGH (ref 70–99)
Potassium: 4.8 mmol/L (ref 3.5–5.2)
Sodium: 138 mmol/L (ref 134–144)
eGFR: 74 mL/min/{1.73_m2} (ref >59)

## 2021-09-05 LAB — CBC
Hematocrit: 43 % (ref 37.5–51.0)
Hemoglobin: 14.7 g/dL (ref 13.0–17.7)
MCH: 31.3 pg (ref 26.6–33.0)
MCHC: 34.2 g/dL (ref 31.5–35.7)
MCV: 92 fL (ref 79–97)
Platelets: 252 10*3/uL (ref 150–450)
RBC: 4.69 x10E6/uL (ref 4.14–5.80)
RDW: 14.2 % (ref 11.6–15.4)
WBC: 8.3 10*3/uL (ref 3.4–10.8)

## 2021-09-07 ENCOUNTER — Telehealth: Payer: Self-pay | Admitting: Cardiology

## 2021-09-07 NOTE — Telephone Encounter (Signed)
Patient made aware, verbalized understanding

## 2021-09-07 NOTE — Telephone Encounter (Signed)
I would stop aspirin and continue the eliquis with plavix. Leafy greens are fine to eat on these medications  Zandra Abts MD

## 2021-09-07 NOTE — Telephone Encounter (Signed)
Pt c/o medication issue:  1. Name of Medication: clopidogrel (PLAVIX) 75 MG tablet aspirin EC 81 MG tablet  apixaban (ELIQUIS) 5 MG TABS tablet  2. How are you currently taking this medication (dosage and times per day)? As prescribed   3. Are you having a reaction (difficulty breathing--STAT)? No  4. What is your medication issue? Pt is requesting call back for medication clarification. Pt would like to know if it is okay to continue taking the plavix and aspirin along with the eliquis. Pt also would like to know if it is okay to take this medication with leafy greens. Please advise.

## 2021-10-02 ENCOUNTER — Encounter: Payer: Self-pay | Admitting: Student

## 2021-10-02 ENCOUNTER — Ambulatory Visit: Payer: Medicare HMO | Admitting: Student

## 2021-10-02 VITALS — BP 104/60 | HR 72 | Ht 66.0 in | Wt 230.2 lb

## 2021-10-02 DIAGNOSIS — I442 Atrioventricular block, complete: Secondary | ICD-10-CM | POA: Diagnosis not present

## 2021-10-02 DIAGNOSIS — I480 Paroxysmal atrial fibrillation: Secondary | ICD-10-CM

## 2021-10-02 DIAGNOSIS — I10 Essential (primary) hypertension: Secondary | ICD-10-CM | POA: Diagnosis not present

## 2021-10-02 DIAGNOSIS — I2510 Atherosclerotic heart disease of native coronary artery without angina pectoris: Secondary | ICD-10-CM | POA: Diagnosis not present

## 2021-10-02 LAB — CBC
Hematocrit: 44.1 % (ref 37.5–51.0)
Hemoglobin: 15.3 g/dL (ref 13.0–17.7)
MCH: 30.7 pg (ref 26.6–33.0)
MCHC: 34.7 g/dL (ref 31.5–35.7)
MCV: 88 fL (ref 79–97)
Platelets: 236 10*3/uL (ref 150–450)
RBC: 4.99 x10E6/uL (ref 4.14–5.80)
RDW: 13 % (ref 11.6–15.4)
WBC: 7.8 10*3/uL (ref 3.4–10.8)

## 2021-10-02 LAB — BASIC METABOLIC PANEL
BUN/Creatinine Ratio: 24 (ref 10–24)
BUN: 27 mg/dL (ref 8–27)
CO2: 21 mmol/L (ref 20–29)
Calcium: 9.7 mg/dL (ref 8.6–10.2)
Chloride: 100 mmol/L (ref 96–106)
Creatinine, Ser: 1.13 mg/dL (ref 0.76–1.27)
Glucose: 99 mg/dL (ref 70–99)
Potassium: 4.8 mmol/L (ref 3.5–5.2)
Sodium: 136 mmol/L (ref 134–144)
eGFR: 67 mL/min/{1.73_m2} (ref >59)

## 2021-10-02 NOTE — Progress Notes (Signed)
Electrophysiology Office Note Date: 10/02/2021  ID:  Jonathan Hoop Maude Leriche., DOB Jun 10, 1945, MRN [id number]  PCP: [name] Primary Cardiologist: Carlyle Dolly, MD Electrophysiologist: Constance Haw, MD   CC: Pacemaker follow-up  Jonathan Hoop Eragon Hammond. is a 76 y.o. male seen today for Will Meredith Leeds, MD for routine EP follow up. At last visit was started on eliquis for new AF.   Since last being seen in our clinic doing very well. Still having some mild SOB but attributing it to the heat. No missed doses of eliquis. Had ~ 2 hours total of fib in the past month, each time was while sleeping, so no symptoms. He denies symptoms of palpitations, chest pain, orthopnea, PND, lower extremity edema, claudication, dizziness, presyncope, syncope, bleeding, or neurologic sequela. The patient is tolerating medications without difficulties.       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  Past Medical History:  Diagnosis Date   AAA (abdominal aortic aneurysm) (HCC)    CAD (coronary artery disease)    CHF (congestive heart failure) (HCC)    Diverticulosis    Dyspnea    W/ EXERTION    Gout    Gynecomastia    Hx of emphysema    Hyperlipidemia    Hypertension    Lumbar spinal stenosis    OSA (obstructive sleep apnea)    Pedal edema    Past Surgical History:  Procedure Laterality Date   ABDOMINAL AORTIC ANEURYSM REPAIR  2013 and 2018   BACK SURGERY     CARDIAC CATHETERIZATION  04/2016   [place], [place]   ELBOW ARTHROSCOPY Left 2011   I & D EXTREMITY Right 08/23/2020   Procedure: IRRIGATION AND DEBRIDEMENT HAND;  Surgeon: Milly Jakob, MD;  Location: [facility];  Service: Orthopedics;  Laterality: Right;  Wound VAC application   I & D EXTREMITY Right 08/26/2020   Procedure: IRRIGATION AND DEBRIDEMENT RIGHT HAND;  Surgeon: Cindra Presume, MD;  Location: [place];  Service: Plastics;  Laterality: Right;   I & D EXTREMITY Right 08/29/2020   Procedure:  INCISION AND DEBRIDEMENT, HAND;  Surgeon: Cindra Presume, MD;  Location: [facility];  Service: Plastics;  Laterality: Right;   NECK SURGERY     PACEMAKER IMPLANT N/A 11/28/2020   Procedure: PACEMAKER IMPLANT;  Surgeon: Constance Haw, MD;  Location: [facility];  Service: Cardiovascular;  Laterality: N/A;   THORACIC AORTIC ENDOVASCULAR STENT GRAFT N/A 10/26/2017   Procedure: THORACIC AORTIC ENDOVASCULAR STENT GRAFT WITH ILIAC EXTENSIONS;  Surgeon: Rosetta Posner, MD;  Location: [facility];  Service: Vascular;  Laterality: N/A;  PATIENT WILL NEED SPINAL CORD DRAIN BY ANESTHESIA   VASECTOMY  1976    Current Outpatient Medications  Medication Sig Dispense Refill   acetaminophen (TYLENOL) 500 MG tablet Take 500 mg by mouth every 8 (eight) hours as needed for moderate pain.     albuterol (PROVENTIL HFA;VENTOLIN HFA) 108 (90 Base) MCG/ACT inhaler Inhale 1-2 puffs into the lungs every 6 (six) hours as needed for wheezing or shortness of breath. 1 Inhaler 2   allopurinol (ZYLOPRIM) 300 MG tablet TAKE 1 & 1/2 (ONE & ONE-HALF) TABLETS BY MOUTH ONCE DAILY 45 tablet 2   apixaban (ELIQUIS) 5 MG TABS tablet Take 1 tablet (5 mg total) by mouth 2 (two) times daily. 180 tablet 3   atorvastatin (LIPITOR) 80 MG tablet TAKE 1 TABLET BY MOUTH AT BEDTIME 90 tablet 3   Cholecalciferol (VITAMIN  D3) 125 MCG (5000 UT) CAPS Take 5,000 Units by mouth daily.     clopidogrel (PLAVIX) 75 MG tablet Take 1 tablet by mouth once daily 90 tablet 3   Cyanocobalamin ([employer] VITAMIN B-12) 5000 MCG SUBL Place 5,000 mcg under the tongue daily.     JARDIANCE 10 MG TABS tablet Take 1 tablet (10 mg total) by mouth daily. 90 tablet 3   metoprolol succinate (TOPROL XL) 25 MG 24 hr tablet Take 1 tablet (25 mg total) by mouth at bedtime. 90 tablet 3   nitroGLYCERIN (NITROSTAT) 0.4 MG SL tablet Place 1 tablet (0.4 mg total) under the tongue every 5 (five) minutes x 3 doses as needed for chest pain. 25 tablet 1   potassium chloride SA (KLOR-CON  M) 20 MEQ tablet TAKE 1  BY MOUTH ONCE DAILY 90 tablet 1   sacubitril-valsartan (ENTRESTO) 24-26 MG Take 1 tablet by mouth 2 (two) times daily. 180 tablet 3   spironolactone (ALDACTONE) 25 MG tablet Take 0.5 tablets (12.5 mg total) by mouth daily. 45 tablet 3   torsemide (DEMADEX) 20 MG tablet Take 1 tablet (20 mg total) by mouth daily. 90 tablet 1   No current facility-administered medications for this visit.    Allergies:   Atorvastatin   Social History: Social History   Socioeconomic History   Marital status: Married    Spouse name: Not on file   Number of children: Not on file   Years of education: Not on file   Highest education level: Not on file  Occupational History   Not on file  Tobacco Use   Smoking status: Former    Packs/day: 0.25    Years: 58.00    Total pack years: 14.50    Types: Cigars, Cigarettes    Start date: 11/15/1961    Quit date: 09/15/2020    Years since quitting: 1.0   Smokeless tobacco: Never  Vaping Use   Vaping Use: Never used  Substance and Sexual Activity   Alcohol use: Yes    Comment: occasionally   Drug use: Never   Sexual activity: Not on file  Other Topics Concern   Not on file  Social History Narrative   Not on file   Social Determinants of Health   Financial Resource Strain: Not on file  Food Insecurity: Not on file  Transportation Needs: Not on file  Physical Activity: Not on file  Stress: Not on file  Social Connections: Not on file  Intimate Partner Violence: Not on file    Family History: Family History  Problem Relation Age of Onset   Alzheimer's disease Mother    Heart attack Father    Leukemia Father    Hypertension Father    Gout Maternal Uncle    Hypertension Son    Review of systems complete and found to be negative unless listed in HPI.    Physical Exam: Vitals:   10/02/21 0910  BP: 104/60  Pulse: 72  SpO2: 94%  Weight: 230 lb 3.2 oz (104.4 kg)  Height: '5\' 6"'$  (1.676 m)     General: Pleasant, NAD.  No resp difficulty Psych: Normal affect. HEENT:  Normal, without mass or lesion.         Neck: Supple, no bruits or JVD. Carotids 2+. No lymphadenopathy/thyromegaly appreciated. Heart: PMI nondisplaced. RRR no s3, s4, or murmurs. Lungs:  Resp regular and unlabored, CTA. Abdomen: Soft, non-tender, non-distended, No HSM, BS + x 4.   Extremities: No clubbing, cyanosis or edema. DP/PT/Radials  2+ and equal bilaterally. Neuro: Alert and oriented X 3. Moves all extremities spontaneously.   PPM Interrogation- No full check today.   EKG:  EKG is not ordered today.  Recent Labs: 11/27/2020: B Natriuretic Peptide 1,193.3; TSH 2.076 11/28/2020: ALT 13 11/29/2020: Magnesium 2.0 09/04/2021: BUN 17; Creatinine, Ser 1.04; Hemoglobin 14.7; Platelets 252; Potassium 4.8; Sodium 138   Wt Readings from Last 3 Encounters:  10/02/21 230 lb 3.2 oz (104.4 kg)  09/04/21 230 lb 12.8 oz (104.7 kg)  06/24/21 223 lb (101.2 kg)     Other studies Reviewed: Additional studies/ records that were reviewed today include: Previous EP office notes, Previous remote checks, Most recent labwork.   Assessment and Plan: 1. CHB s/p Medtronic PPM  Underlying CHB but escape in 108s, very symptomatic with intrinsic testing. Normal PPM function See Claudia Desanctis Art report No changes today  2. Paroxysmal atrial fibrillation.  NSR today by device with burden of 0.3%. Had 1.5 hr episode 8/12 while sleeping.  Continue eliquis 5 mg BID. Labs today Continue toprol 25 mg daily.   3. CAD S/p NSTEMI with PCI to LAD 09/2020 Was recommended for prolonged DAPT.  OFF ASA Continue eliquis and plavix together.    Current medicines are reviewed at length with the patient today.    Labs/ tests ordered today include:  No orders of the defined types were placed in this encounter.   Disposition:   Follow up with Dr. Curt Bears in 4 months.    Jacalyn Lefevre, PA-C  10/02/2021 9:24 AM  [address] [place] [address]  [place] [address] [phone] (office) [phone] (fax)

## 2021-10-02 NOTE — Patient Instructions (Signed)
Medication Instructions:  Your physician recommends that you continue on your current medications as directed. Please refer to the Current Medication list given to you today.  *If you need a refill on your cardiac medications before your next appointment, please call your pharmacy*   Lab Work: TODAY: BMET, CBC  If you have labs (blood work) drawn today and your tests are completely normal, you will receive your results only by: [place] (if you have MyChart) OR A paper copy in the mail If you have any lab test that is abnormal or we need to change your treatment, we will call you to review the results.   Follow-Up: At [facility], you and your health needs are our priority.  As part of our continuing mission to provide you with exceptional heart care, we have created designated Provider Care Teams.  These Care Teams include your primary Cardiologist (physician) and Advanced Practice Providers (APPs -  Physician Assistants and Nurse Practitioners) who all work together to provide you with the care you need, when you need it.   Your next appointment:   4 month(s)  The format for your next appointment:   In Person  Provider:   Allegra Lai, MD{

## 2021-10-05 NOTE — Progress Notes (Signed)
Remote pacemaker transmission.   

## 2021-10-22 IMAGING — DX DG CHEST 1V PORT
1 series · 1 of 1 positions shown · non-contrast
Comparison: 10/26/2017

CLINICAL DATA: Syncope.

EXAM:
PORTABLE CHEST 1 VIEW

[chest]
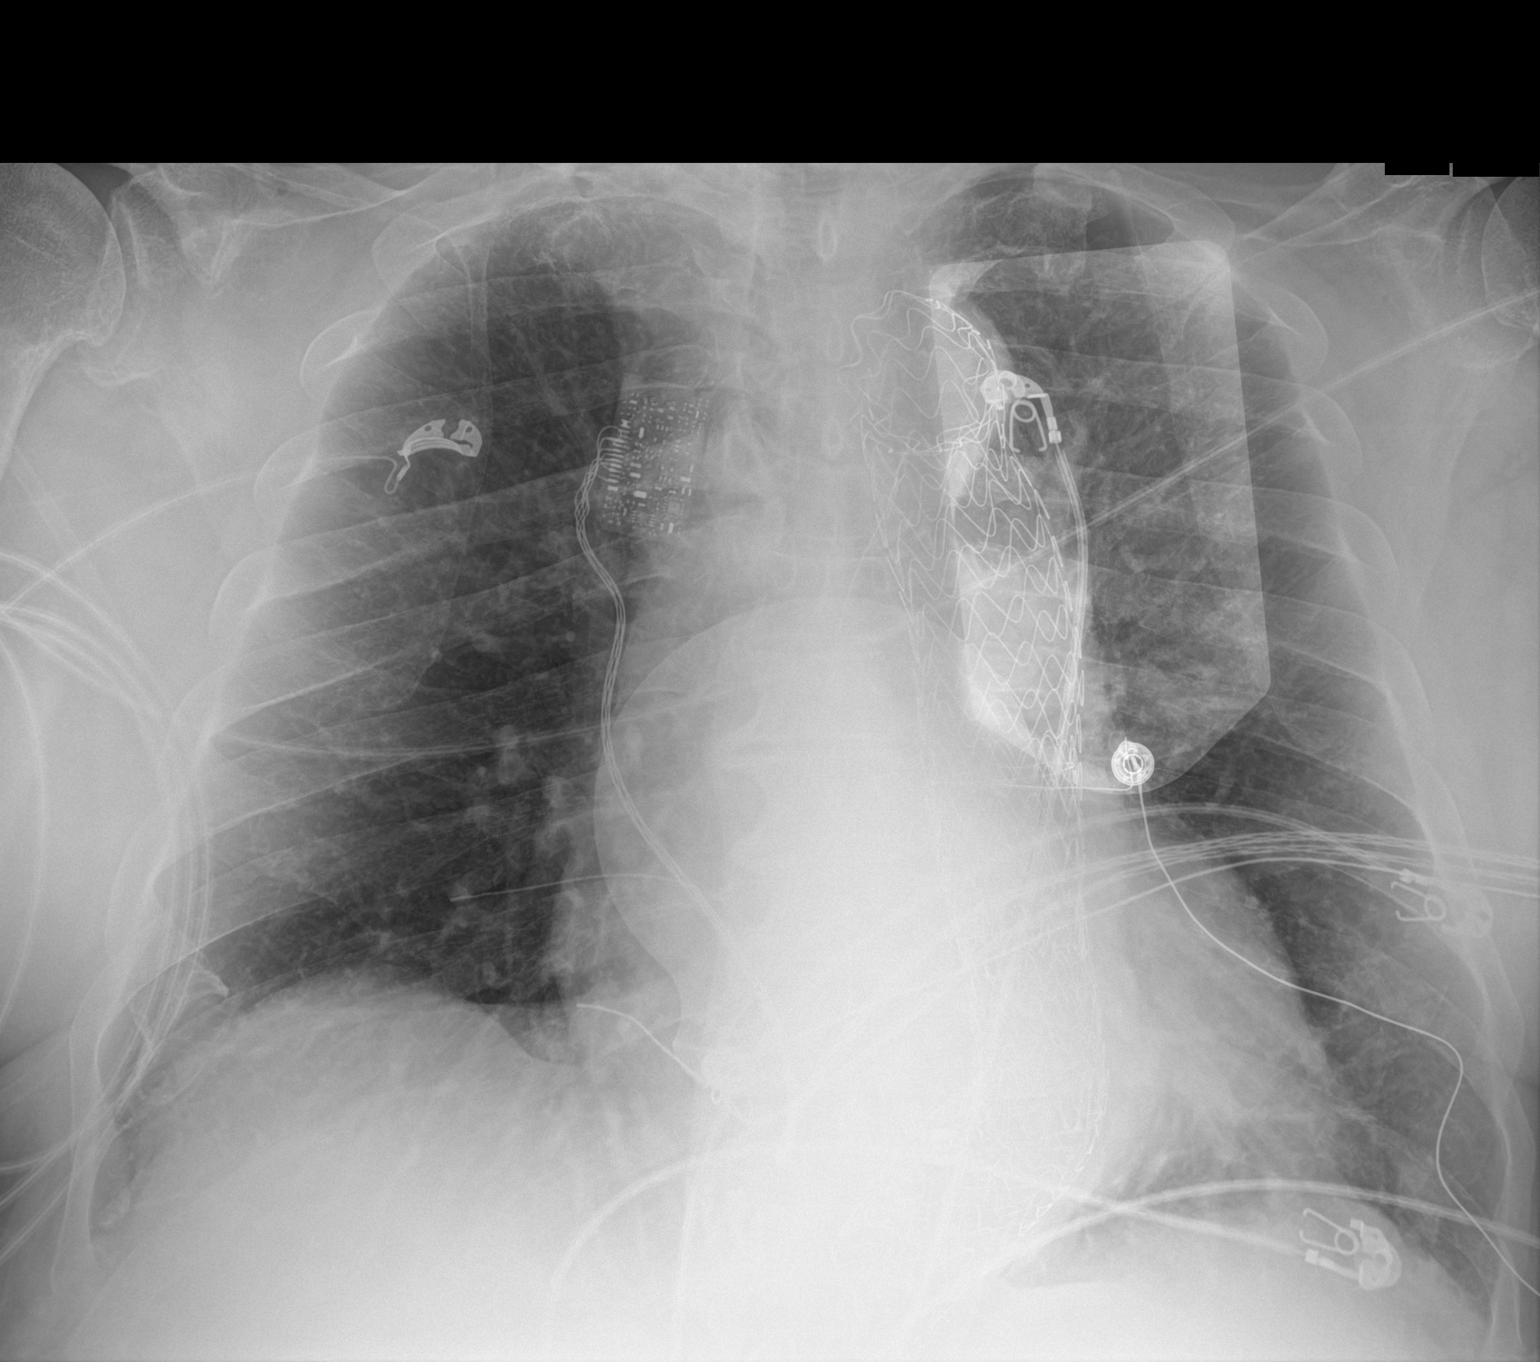

[1 of 1 positions shown; findings below may reference images not displayed]

FINDINGS: Cardiac enlargement. No vascular congestion, edema, or
consolidation. No pleural effusions. No pneumothorax. Postoperative
stenting of the descending aorta. Mediastinal contours appear
intact.
IMPRESSION: Cardiac enlargement.  No evidence of active pulmonary disease.

## 2021-10-24 IMAGING — DX DG CHEST 2V
2 series · 2 of 2 positions shown · non-contrast
Comparison: 11/27/2020

CLINICAL DATA: Encounter for cardiac device

EXAM:
CHEST - 2 VIEW

[chest lat]
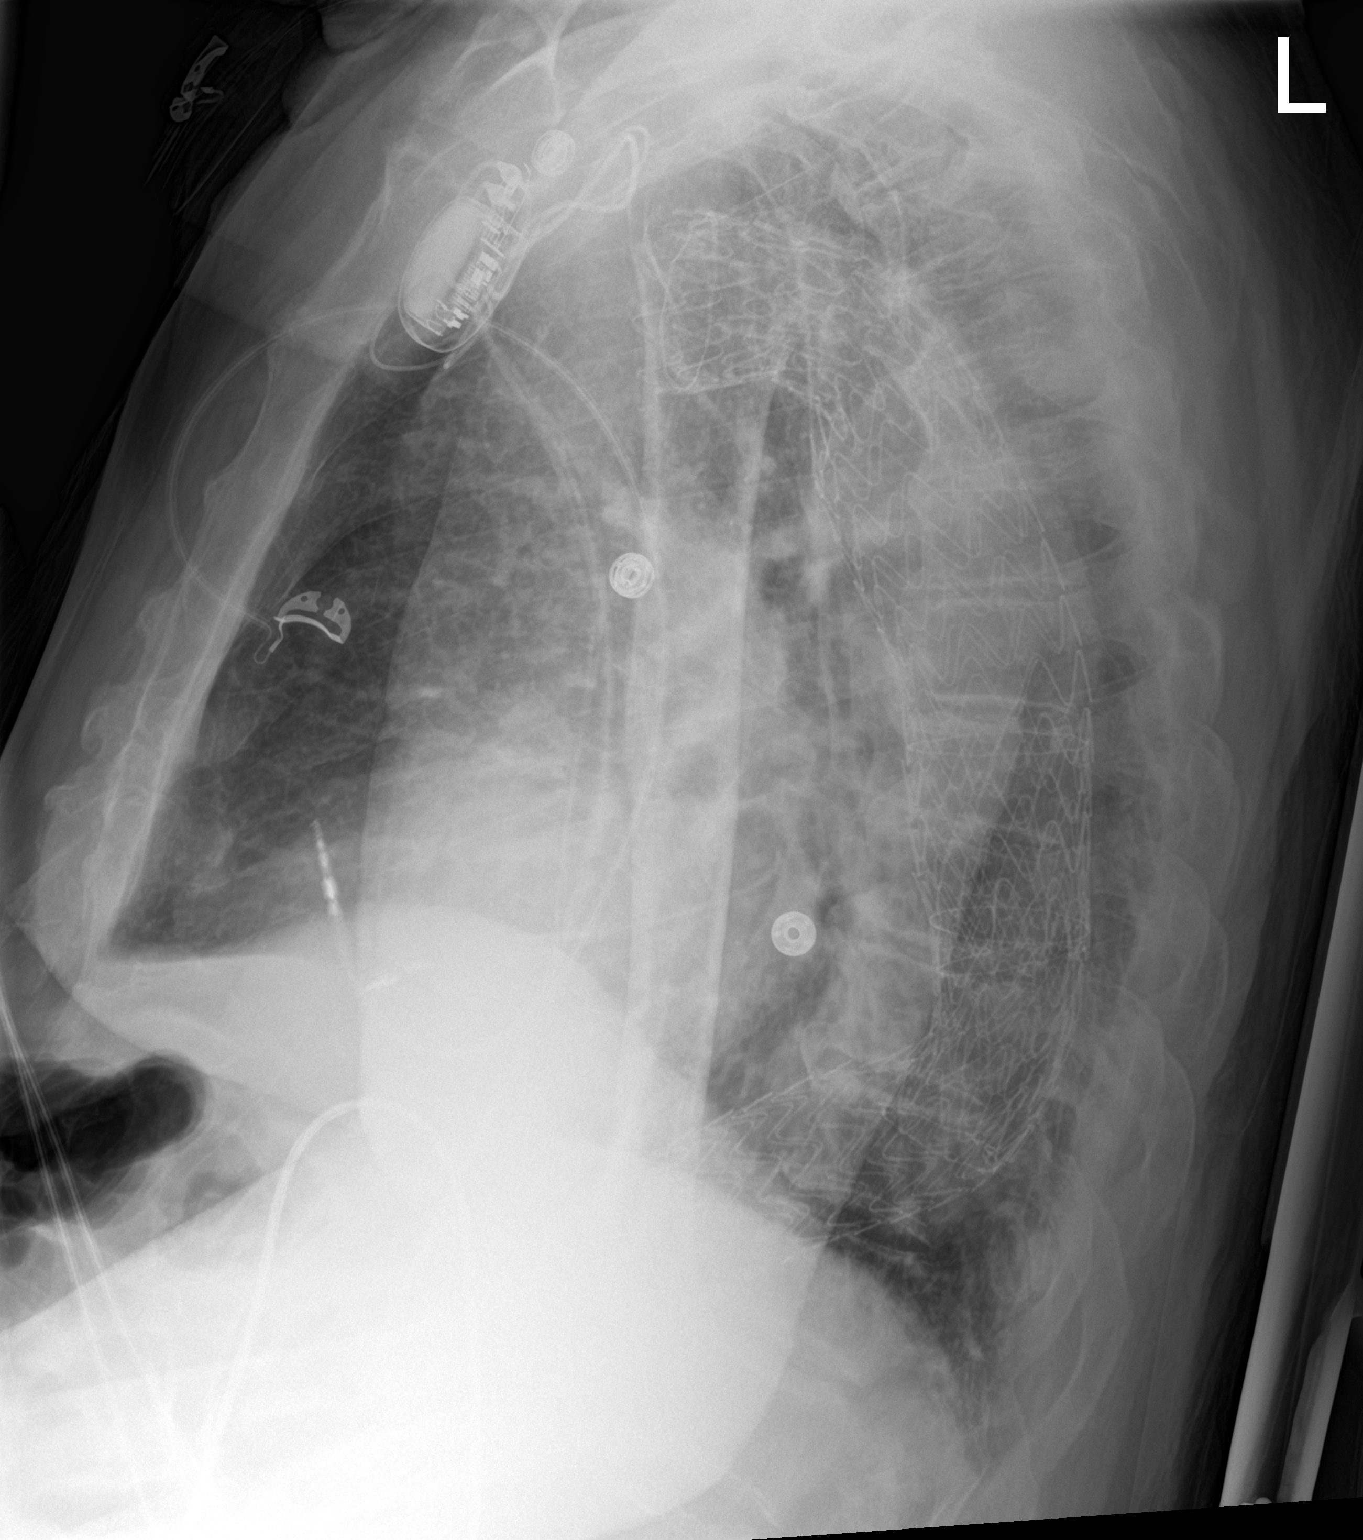

[chest ap]
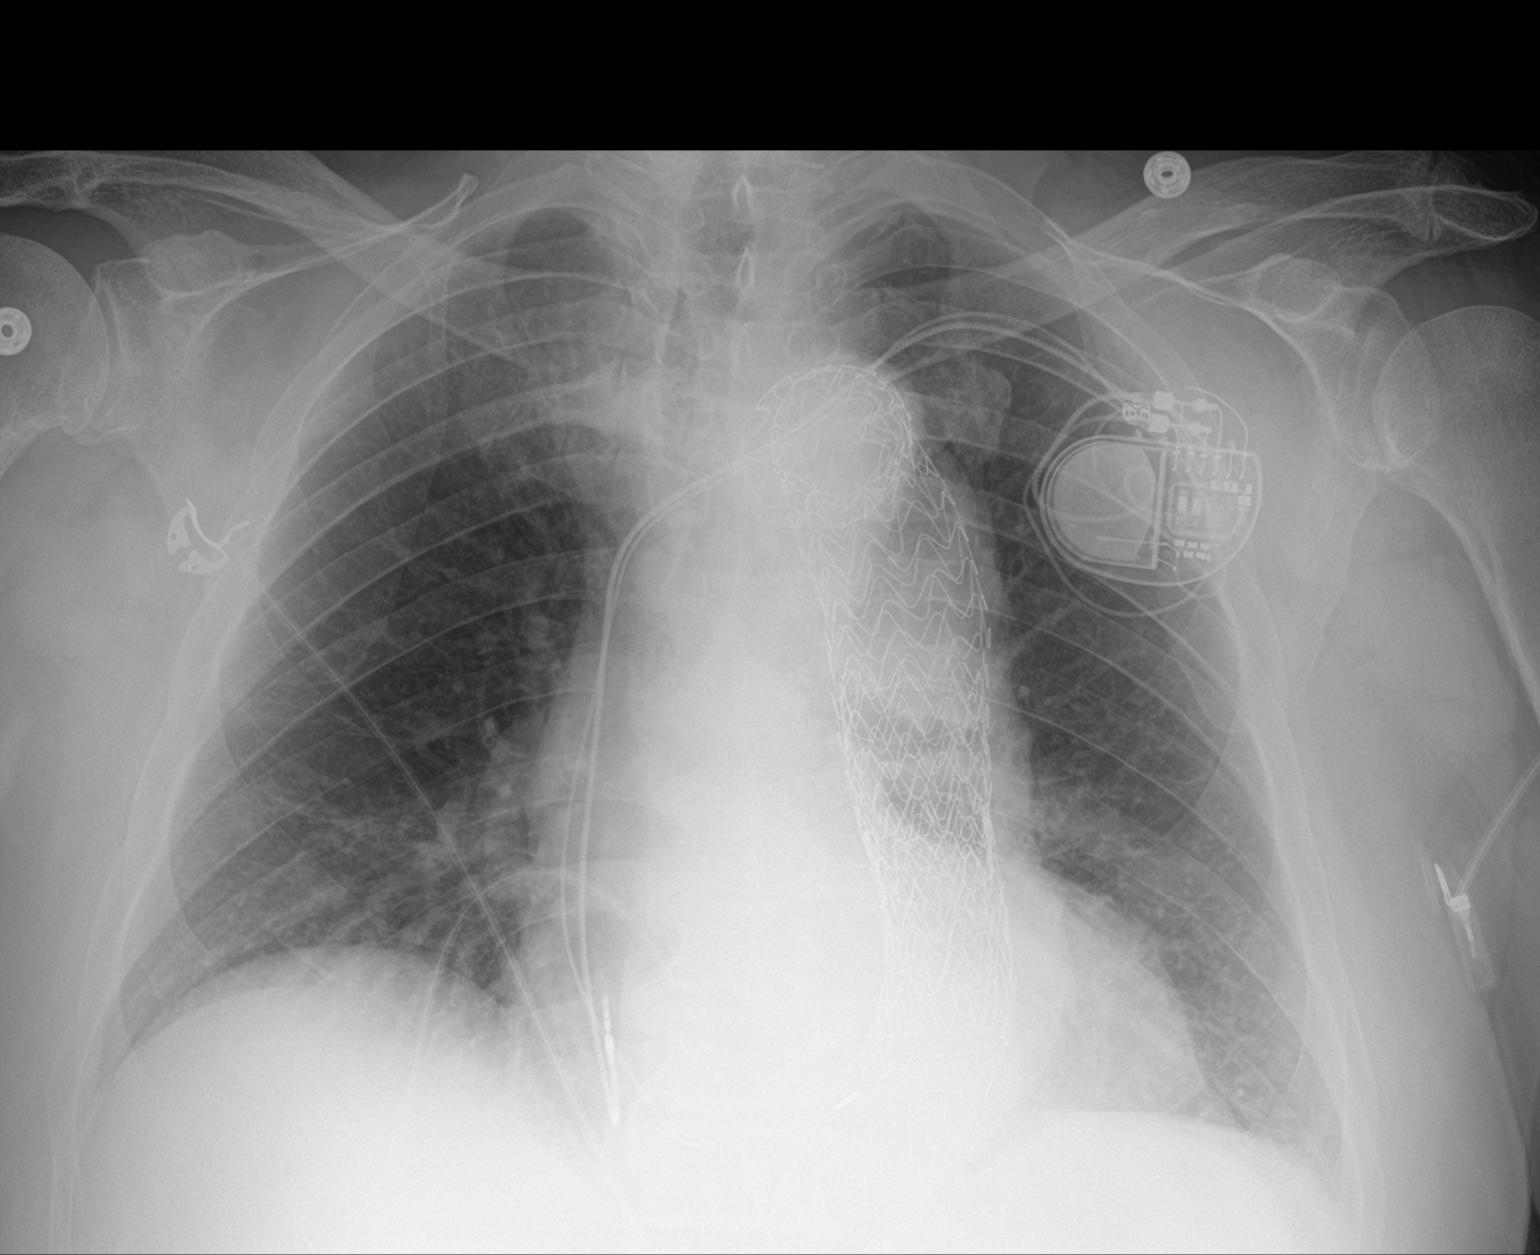

[2 of 2 positions shown; findings below may reference images not displayed]

FINDINGS: Cardiomegaly and aortic tortuosity. Aortic stent grafting.
Dual-chamber pacer implant with leads roughly over the right
ventricle and right atrial appendage. No pneumothorax.
IMPRESSION: No unexpected finding after dual-chamber pacer implant.

## 2021-10-28 DIAGNOSIS — G4733 Obstructive sleep apnea (adult) (pediatric): Secondary | ICD-10-CM | POA: Diagnosis not present

## 2021-11-03 DIAGNOSIS — Z87891 Personal history of nicotine dependence: Secondary | ICD-10-CM | POA: Diagnosis not present

## 2021-11-03 DIAGNOSIS — Z6839 Body mass index (BMI) 39.0-39.9, adult: Secondary | ICD-10-CM | POA: Diagnosis not present

## 2021-11-03 DIAGNOSIS — Z299 Encounter for prophylactic measures, unspecified: Secondary | ICD-10-CM | POA: Diagnosis not present

## 2021-11-03 DIAGNOSIS — I509 Heart failure, unspecified: Secondary | ICD-10-CM | POA: Diagnosis not present

## 2021-11-03 DIAGNOSIS — I4891 Unspecified atrial fibrillation: Secondary | ICD-10-CM | POA: Diagnosis not present

## 2021-11-03 DIAGNOSIS — I25118 Atherosclerotic heart disease of native coronary artery with other forms of angina pectoris: Secondary | ICD-10-CM | POA: Diagnosis not present

## 2021-11-03 DIAGNOSIS — I10 Essential (primary) hypertension: Secondary | ICD-10-CM | POA: Diagnosis not present

## 2021-11-27 DIAGNOSIS — G4733 Obstructive sleep apnea (adult) (pediatric): Secondary | ICD-10-CM | POA: Diagnosis not present

## 2021-11-30 ENCOUNTER — Ambulatory Visit (INDEPENDENT_AMBULATORY_CARE_PROVIDER_SITE_OTHER): Payer: Medicare HMO

## 2021-11-30 DIAGNOSIS — I442 Atrioventricular block, complete: Secondary | ICD-10-CM

## 2021-11-30 LAB — CUP PACEART REMOTE DEVICE CHECK
Battery Remaining Longevity: 139 mo
Battery Voltage: 3.06 V
Brady Statistic AP VP Percent: 4.55 %
Brady Statistic AP VS Percent: 0 %
Brady Statistic AS VP Percent: 95.21 %
Brady Statistic AS VS Percent: 0.17 %
Brady Statistic RA Percent Paced: 4.1 %
Brady Statistic RV Percent Paced: 99.67 %
Date Time Interrogation Session: 20231016035919
Implantable Lead Implant Date: 20221014
Implantable Lead Implant Date: 20221014
Implantable Lead Location: 753859
Implantable Lead Location: 753860
Implantable Lead Model: 3830
Implantable Lead Model: 5076
Implantable Pulse Generator Implant Date: 20221014
Lead Channel Impedance Value: 323 Ohm
Lead Channel Impedance Value: 361 Ohm
Lead Channel Impedance Value: 437 Ohm
Lead Channel Impedance Value: 475 Ohm
Lead Channel Pacing Threshold Amplitude: 0.625 V
Lead Channel Pacing Threshold Amplitude: 0.75 V
Lead Channel Pacing Threshold Pulse Width: 0.4 ms
Lead Channel Pacing Threshold Pulse Width: 0.4 ms
Lead Channel Sensing Intrinsic Amplitude: 21.625 mV
Lead Channel Sensing Intrinsic Amplitude: 21.625 mV
Lead Channel Sensing Intrinsic Amplitude: 5.75 mV
Lead Channel Sensing Intrinsic Amplitude: 5.75 mV
Lead Channel Setting Pacing Amplitude: 1.5 V
Lead Channel Setting Pacing Amplitude: 2 V
Lead Channel Setting Pacing Pulse Width: 0.4 ms
Lead Channel Setting Sensing Sensitivity: 0.9 mV

## 2021-12-16 ENCOUNTER — Encounter: Payer: Self-pay | Admitting: Cardiology

## 2021-12-16 ENCOUNTER — Ambulatory Visit: Payer: Medicare HMO | Attending: Cardiology | Admitting: Cardiology

## 2021-12-16 VITALS — BP 100/52 | HR 65 | Ht 66.0 in | Wt 242.0 lb

## 2021-12-16 DIAGNOSIS — I2510 Atherosclerotic heart disease of native coronary artery without angina pectoris: Secondary | ICD-10-CM | POA: Diagnosis not present

## 2021-12-16 DIAGNOSIS — I480 Paroxysmal atrial fibrillation: Secondary | ICD-10-CM | POA: Diagnosis not present

## 2021-12-16 DIAGNOSIS — G4733 Obstructive sleep apnea (adult) (pediatric): Secondary | ICD-10-CM | POA: Diagnosis not present

## 2021-12-16 DIAGNOSIS — I5022 Chronic systolic (congestive) heart failure: Secondary | ICD-10-CM | POA: Diagnosis not present

## 2021-12-16 DIAGNOSIS — I442 Atrioventricular block, complete: Secondary | ICD-10-CM

## 2021-12-16 DIAGNOSIS — D6869 Other thrombophilia: Secondary | ICD-10-CM | POA: Diagnosis not present
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
## 2021-12-16 NOTE — Patient Instructions (Addendum)
Medication Instructions:  Continue all current medications.  Labwork: none  Testing/Procedures: none  Follow-Up: 6 months   Any Other Special Instructions Will Be Listed Below (If Applicable). You have been referred to Pulmonology - Dr. Elsworth Soho   If you need a refill on your cardiac medications before your next appointment, please call your pharmacy.

## 2021-12-17 NOTE — Progress Notes (Signed)
Remote pacemaker transmission.   

## 2021-12-25 ENCOUNTER — Ambulatory Visit: Payer: Medicare HMO | Admitting: Cardiology

## 2021-12-28 DIAGNOSIS — G4733 Obstructive sleep apnea (adult) (pediatric): Secondary | ICD-10-CM | POA: Diagnosis not present

## 2021-12-30 ENCOUNTER — Other Ambulatory Visit: Payer: Self-pay | Admitting: Cardiology

## 2022-01-01 DIAGNOSIS — Z299 Encounter for prophylactic measures, unspecified: Secondary | ICD-10-CM | POA: Diagnosis not present

## 2022-01-01 DIAGNOSIS — M549 Dorsalgia, unspecified: Secondary | ICD-10-CM | POA: Diagnosis not present

## 2022-01-01 DIAGNOSIS — I4891 Unspecified atrial fibrillation: Secondary | ICD-10-CM | POA: Diagnosis not present

## 2022-01-01 DIAGNOSIS — D6869 Other thrombophilia: Secondary | ICD-10-CM | POA: Diagnosis not present

## 2022-01-01 DIAGNOSIS — I10 Essential (primary) hypertension: Secondary | ICD-10-CM | POA: Diagnosis not present

## 2022-01-04 ENCOUNTER — Other Ambulatory Visit: Payer: Self-pay | Admitting: Cardiology

## 2022-01-12 ENCOUNTER — Other Ambulatory Visit: Payer: Self-pay | Admitting: Cardiology

## 2022-01-14 ENCOUNTER — Other Ambulatory Visit: Payer: Self-pay

## 2022-01-14 ENCOUNTER — Telehealth: Payer: Self-pay | Admitting: Cardiology

## 2022-01-14 DIAGNOSIS — I712 Thoracic aortic aneurysm, without rupture, unspecified: Secondary | ICD-10-CM

## 2022-01-14 DIAGNOSIS — I5032 Chronic diastolic (congestive) heart failure: Secondary | ICD-10-CM | POA: Diagnosis not present

## 2022-01-14 DIAGNOSIS — I10 Essential (primary) hypertension: Secondary | ICD-10-CM | POA: Diagnosis not present

## 2022-01-14 NOTE — Telephone Encounter (Signed)
Jonathan Garner with [facility] calling to speak with Dr. Harl Bowie or his nurse to confirm the patient's medications. Phone: [phone]

## 2022-01-14 NOTE — Telephone Encounter (Signed)
Larene Beach called to verify if patient is supposed to be on plavix and eliquis. Advised that patient is on both. Verbalized understanding.

## 2022-01-15 NOTE — Telephone Encounter (Signed)
Yes should be on both, extended plavix for proximal LAD stent which is a very high priority location to protect the stent in and eliquis for the afib. Should be on both  Zandra Abts MD

## 2022-01-26 ENCOUNTER — Encounter: Payer: Self-pay | Admitting: *Deleted

## 2022-01-26 ENCOUNTER — Telehealth: Payer: Self-pay | Admitting: *Deleted

## 2022-01-26 DIAGNOSIS — G4733 Obstructive sleep apnea (adult) (pediatric): Secondary | ICD-10-CM | POA: Diagnosis not present

## 2022-01-26 NOTE — Patient Outreach (Signed)
  Care Coordination   Initial Visit Note   01/26/2022 Name: Jonathan Garner. MRN: [id number] DOB: January 11, 1946  Jonathan Garner. is a 76 y.o. year old male who sees Annamarie Dawley, NP for primary care. I spoke with  Jonathan Garner. by phone today.  What matters to the patients health and wellness today?  No needs    Goals Addressed               This Visit's Progress     COMPLETED: Symptoms related to new medication Entresto (pt-stated)        Care Coordination Interventions: Advised patient to Contact the provider that prescribed his new medications Entresto (cardiologist). Reviewed medications with patient and discussed adherence with no needed refills Collaborated with Dr. Harl Bowie (cardiologist) regarding Pt complaints taking this medications Delene Loll). Verified no acute symptoms. "Body aches all over and back pain". Reviewed scheduled/upcoming provider appointments including sufficient transportation source Screening for signs and symptoms of depression related to chronic disease state  Assessed social determinant of health barriers         SDOH assessments and interventions completed:  Yes  SDOH Interventions Today    Flowsheet Row Most Recent Value  SDOH Interventions   Food Insecurity Interventions Intervention Not Indicated  Housing Interventions Intervention Not Indicated  Transportation Interventions Intervention Not Indicated  Utilities Interventions Intervention Not Indicated        Care Coordination Interventions:  Yes, provided   Follow up plan: No further intervention required.   Encounter Outcome:  Pt. Visit Completed   Raina Mina, RN Care Management Coordinator [place] Office [phone]

## 2022-01-26 NOTE — Patient Instructions (Addendum)
Visit Information  Thank you for taking time to visit with me today. Please don't hesitate to contact me if I can be of assistance to you.   Following are the goals we discussed today:   Goals Addressed               This Visit's Progress     COMPLETED: Symptoms related to new medication Entresto (pt-stated)        Care Coordination Interventions: Advised patient to Contact the provider that prescribed his new medications Entresto (cardiologist). Reviewed medications with patient and discussed adherence with no needed refills Collaborated with Dr. Harl Bowie (cardiologist) regarding Pt complaints taking this medications Delene Loll). Verified no acute symptoms. "Body aches all over and back pain". Reviewed scheduled/upcoming provider appointments including sufficient transportation source Screening for signs and symptoms of depression related to chronic disease state  Assessed social determinant of health barriers         Please call the care guide team at [phone] if you need to cancel or reschedule your appointment.   If you are experiencing a Mental Health or [place] or need someone to talk to, please  call the Suicide and Crisis Lifeline: 988 call the [place] National Suicide Prevention Lifeline: 778-294-1132 or TTY: (702) 202-9109 TTY 203-710-3674) to talk to a trained counselor call 1-800-273-TALK (toll free, 24 hour hotline) Patient verbalizes understanding of instructions and care plan provided today and agrees to view in [place]. Active MyChart status and patient understanding of how to access instructions and care plan via MyChart confirmed with patient.     No further follow up required: No needs   Raina Mina, RN Care Management Coordinator [place] Office [phone]

## 2022-02-01 ENCOUNTER — Encounter: Payer: Self-pay | Admitting: *Deleted

## 2022-02-01 ENCOUNTER — Telehealth: Payer: Self-pay | Admitting: *Deleted

## 2022-02-01 NOTE — Telephone Encounter (Signed)
Patient states that the aching in joints / back pain has been going on x 1 month.  Informed him that this is not likely related to his Delene Loll since has has been on that medication long before this the aching began.  Also, has been on Lipitor for a really long time as well so not likely that would be the cause either.  Suggested that he discuss further with his pcp but will send to provider to see if he has any other suggestions in the mean time.

## 2022-02-01 NOTE — Telephone Encounter (Signed)
Left message to return call 

## 2022-02-01 NOTE — Telephone Encounter (Signed)
Replied via mychart.

## 2022-02-01 NOTE — Telephone Encounter (Signed)
-----   Message from Arnoldo Lenis, MD sent at 01/29/2022  1:36 PM EST ----- Regarding: RE: Medication symptoms Thanks for update. Edd Fabian can we check with this patient, from drug list he has been on entresto several months, not clear to me if has been taking thsi long would suddenyl get side effects  Zandra Abts MD ----- Message ----- From: Tobi Bastos, RN Sent: 01/26/2022   1:44 PM EST To: Arnoldo Lenis, MD Subject: Medication symptoms                            Spoke with this pt today for care management services and pt has indicated he is experiencing some joint pain and aching all over his body especially his back since starting the new medications Entresto. Provided education and possible side effects however pt experiencing more symptoms.   Please reach out to pt directly for possible interventions. No other issues for care management services at this time. I have requested pt reach out also to the your office due to prescribing this medications.  Thank you.

## 2022-02-01 NOTE — Telephone Encounter (Signed)
Would suggest a pcp evaluation before we stop medications that are very important for him that would be unlikely to be the issue given the duration he has been on. If pcp cannot find any other causes then med changes are something we may have to consider but would not start there  Zandra Abts MD

## 2022-02-03 ENCOUNTER — Inpatient Hospital Stay: Admission: RE | Admit: 2022-02-03 | Payer: Medicare HMO | Source: Ambulatory Visit

## 2022-02-04 ENCOUNTER — Ambulatory Visit: Payer: Medicare HMO | Admitting: Pulmonary Disease

## 2022-02-04 ENCOUNTER — Encounter: Payer: Self-pay | Admitting: Pulmonary Disease

## 2022-02-04 VITALS — BP 138/88 | HR 72 | Temp 98.4°F | Ht 66.0 in | Wt 239.4 lb

## 2022-02-04 DIAGNOSIS — G4733 Obstructive sleep apnea (adult) (pediatric): Secondary | ICD-10-CM

## 2022-02-04 DIAGNOSIS — J449 Chronic obstructive pulmonary disease, unspecified: Secondary | ICD-10-CM | POA: Diagnosis not present
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
## 2022-02-04 NOTE — Patient Instructions (Signed)
X obtain sleep studies from [facility] or Dr Maxwell Caul X CPAP download

## 2022-02-04 NOTE — Assessment & Plan Note (Signed)
He has been able to quit smoking. Doubt that his dyspnea is related to COPD since it also occurs at rest, he denies anxiety. Will continue to use albuterol on an as-needed basis.  We will investigate further if he has persistent symptoms. He should have low-dose CT screening annually, his aneurysm follow-up scan has been scheduled for January and this should suffice

## 2022-02-04 NOTE — Progress Notes (Signed)
Community message sent to [facility] staff for DL and SS.

## 2022-02-04 NOTE — Assessment & Plan Note (Addendum)
We will obtain his sleep studies from Dr. Elenore Rota or DME. Will obtain and review of CPAP download and tweak settings as needed. After about, we can proceed with writing him a new prescription for CPAP in 2024 when he would be eligible for a new machine.  He is very compliant by report and CPAP is only helped improve his daytime somnolence and fatigue  Weight loss encouraged, compliance with goal of at least 4-6 hrs every night is the expectation. Advised against medications with sedative side effects Cautioned against driving when sleepy - understanding that sleepiness will vary on a day to day basis  Addendum _ CPAP download on auto settings 4-16 cm shows good control of events on avg pr 13 & max pr 15cm

## 2022-02-10 ENCOUNTER — Ambulatory Visit: Payer: Medicare HMO | Admitting: Vascular Surgery

## 2022-02-15 HISTORY — PX: EYE SURGERY: SHX253

## 2022-02-17 ENCOUNTER — Ambulatory Visit: Payer: PPO | Attending: Cardiology | Admitting: Cardiology

## 2022-02-17 ENCOUNTER — Encounter: Payer: Self-pay | Admitting: Cardiology

## 2022-02-17 VITALS — BP 130/76 | HR 65 | Ht 66.0 in | Wt 242.0 lb

## 2022-02-17 DIAGNOSIS — I2510 Atherosclerotic heart disease of native coronary artery without angina pectoris: Secondary | ICD-10-CM | POA: Diagnosis not present

## 2022-02-17 DIAGNOSIS — I442 Atrioventricular block, complete: Secondary | ICD-10-CM

## 2022-02-17 NOTE — Patient Instructions (Signed)
Medication Instructions:  Your physician recommends that you continue on your current medications as directed. Please refer to the Current Medication list given to you today.  *If you need a refill on your cardiac medications before your next appointment, please call your pharmacy*   Lab Work: None ordered   Testing/Procedures: None ordered   Follow-Up: At [facility], you and your health needs are our priority.  As part of our continuing mission to provide you with exceptional heart care, we have created designated Provider Care Teams.  These Care Teams include your primary Cardiologist (physician) and Advanced Practice Providers (APPs -  Physician Assistants and Nurse Practitioners) who all work together to provide you with the care you need, when you need it.   Remote monitoring is used to monitor your Pacemaker or ICD from home. This monitoring reduces the number of office visits required to check your device to one time per year. It allows [place] to keep an eye on the functioning of your device to ensure it is working properly. You are scheduled for a device check from home on 03/01/22. You may send your transmission at any time that day. If you have a wireless device, the transmission will be sent automatically. After your physician reviews your transmission, you will receive a postcard with your next transmission date.  Your next appointment:   1 year(s)  The format for your next appointment:   In Person  Provider:   Allegra Lai, MD    Thank you for choosing [facility]!!   Trinidad Curet, RN [phone]    Other Instructions  Important Information About Sugar

## 2022-02-17 NOTE — Progress Notes (Signed)
Electrophysiology Office Note   Date:  02/17/2022   ID:  Jonathan Garner., DOB 07-01-1945, MRN [id number]  PCP:  Annamarie Dawley, NP  Cardiologist:  Harl Bowie Primary Electrophysiologist:  Constance Haw, MD    Chief Complaint: pacemaker   History of Present Illness: Jonathan Garner. is a 77 y.o. male who is being seen today for the evaluation of pacemaker at the request of [name]. Presenting today for electrophysiology evaluation.  He has a history significant for [name] disease post non-STEMI August 2022 with DES to the LAD and DES to the RCA, CHF, hypertension, hyperlipidemia, AAA status postrepair, OSA.  He presented to hospital with an episode of syncope 11/27/2020 and was found to have complete heart block.  He is post Medtronic dual-chamber pacemaker implanted 11/28/2020.  Today, denies symptoms of palpitations, chest pain, shortness of breath, orthopnea, PND, lower extremity edema, claudication, dizziness, presyncope, syncope, bleeding, or neurologic sequela. The patient is tolerating medications without difficulties.      Past Medical History:  Diagnosis Date   AAA (abdominal aortic aneurysm) (HCC)    CAD (coronary artery disease)    CHF (congestive heart failure) (HCC)    Diverticulosis    Dyspnea    W/ EXERTION    Gout    Gynecomastia    Hx of emphysema (HCC)    Hyperlipidemia    Hypertension    Lumbar spinal stenosis    OSA (obstructive sleep apnea)    Pedal edema    Past Surgical History:  Procedure Laterality Date   ABDOMINAL AORTIC ANEURYSM REPAIR  2013 and 2018   BACK SURGERY     CARDIAC CATHETERIZATION  04/2016   [place], [place]   ELBOW ARTHROSCOPY Left 2011   I & D EXTREMITY Right 08/23/2020   Procedure: IRRIGATION AND DEBRIDEMENT HAND;  Surgeon: Milly Jakob, MD;  Location: [facility];  Service: Orthopedics;  Laterality: Right;  Wound VAC application   I & D EXTREMITY Right 08/26/2020   Procedure: IRRIGATION AND DEBRIDEMENT  RIGHT HAND;  Surgeon: Cindra Presume, MD;  Location: [facility];  Service: Plastics;  Laterality: Right;   I & D EXTREMITY Right 08/29/2020   Procedure: INCISION AND DEBRIDEMENT, HAND;  Surgeon: Cindra Presume, MD;  Location: [facility];  Service: Plastics;  Laterality: Right;   NECK SURGERY     PACEMAKER IMPLANT N/A 11/28/2020   Procedure: PACEMAKER IMPLANT;  Surgeon: Constance Haw, MD;  Location: [facility];  Service: Cardiovascular;  Laterality: N/A;   THORACIC AORTIC ENDOVASCULAR STENT GRAFT N/A 10/26/2017   Procedure: THORACIC AORTIC ENDOVASCULAR STENT GRAFT WITH ILIAC EXTENSIONS;  Surgeon: Rosetta Posner, MD;  Location: [facility];  Service: Vascular;  Laterality: N/A;  PATIENT [name] NEED SPINAL CORD DRAIN BY ANESTHESIA   VASECTOMY  1976     Current Outpatient Medications  Medication Sig Dispense Refill   acetaminophen (TYLENOL) 500 MG tablet Take 500 mg by mouth every 8 (eight) hours as needed for moderate pain.     albuterol (PROVENTIL HFA;VENTOLIN HFA) 108 (90 Base) MCG/ACT inhaler Inhale 1-2 puffs into the lungs every 6 (six) hours as needed for wheezing or shortness of breath. 1 Inhaler 2   apixaban (ELIQUIS) 5 MG TABS tablet Take 1 tablet (5 mg total) by mouth 2 (two) times daily. 180 tablet 3   atorvastatin (LIPITOR) 80 MG tablet TAKE 1 TABLET BY MOUTH AT BEDTIME 90 tablet 3   Cholecalciferol (VITAMIN D3) 125 MCG (5000 UT)  CAPS Take 5,000 Units by mouth daily.     clopidogrel (PLAVIX) 75 MG tablet Take 1 tablet by mouth once daily 90 tablet 3   Cyanocobalamin (CVS VITAMIN B-12) 5000 MCG SUBL Place 5,000 mcg under the tongue daily.     JARDIANCE 10 MG TABS tablet Take 1 tablet by mouth once daily 90 tablet 0   metoprolol succinate (TOPROL XL) 25 MG 24 hr tablet Take 1 tablet (25 mg total) by mouth at bedtime. 90 tablet 3   nitroGLYCERIN (NITROSTAT) 0.4 MG SL tablet Place 1 tablet (0.4 mg total) under the tongue every 5 (five) minutes x 3 doses as needed for chest pain. 25 tablet 1    potassium chloride SA (KLOR-CON M20) 20 MEQ tablet Take 1 tablet by mouth once daily 90 tablet 2   sacubitril-valsartan (ENTRESTO) 24-26 MG Take 1 tablet by mouth twice daily 180 tablet 3   spironolactone (ALDACTONE) 25 MG tablet Take 0.5 tablets (12.5 mg total) by mouth daily. 45 tablet 3   torsemide (DEMADEX) 20 MG tablet Take 1 tablet (20 mg total) by mouth daily. 90 tablet 1   allopurinol (ZYLOPRIM) 300 MG tablet TAKE 1 & 1/2 (ONE & ONE-HALF) TABLETS BY MOUTH ONCE DAILY (Patient not taking: Reported on 02/17/2022) 45 tablet 2   No current facility-administered medications for this visit.    Allergies:   Atorvastatin   Social History:  The patient  reports that he quit smoking about 17 months ago. His smoking use included cigars and cigarettes. He started smoking about 60 years ago. He has a 14.50 pack-year smoking history. He has never used smokeless tobacco. He reports current alcohol use. He reports that he does not use drugs.   Family History:  The patient's family history includes Alzheimer's disease in his mother; Gout in his maternal uncle; Heart attack in his father; Hypertension in his father and son; Leukemia in his father.   ROS:  Please see the history of present illness.   Otherwise, review of systems is positive for none.   All other systems are reviewed and negative.   PHYSICAL EXAM: VS:  BP 130/76   Pulse 65   Ht '5\' 6"'$  (1.676 m)   Wt 242 lb (109.8 kg)   SpO2 96%   BMI 39.06 kg/m  , BMI Body mass index is 39.06 kg/m. GEN: Well nourished, well developed, in no acute distress  HEENT: normal  Neck: no JVD, carotid bruits, or masses Cardiac: RRR; no murmurs, rubs, or gallops,no edema  Respiratory:  clear to auscultation bilaterally, normal work of breathing GI: soft, nontender, nondistended, + BS MS: no deformity or atrophy  Skin: warm and dry, device site well healed Neuro:  Strength and sensation are intact Psych: euthymic mood, full affect  EKG:  EKG is ordered  today. Personal review of the ekg ordered shows A sense, V pace rate 65  Personal review of the device interrogation today. Results in Agency: 10/02/2021: BUN 27; Creatinine, Ser 1.13; Hemoglobin 15.3; Platelets 236; Potassium 4.8; Sodium 136    Lipid Panel  No results found for: "CHOL", "TRIG", "HDL", "CHOLHDL", "VLDL", "LDLCALC", "LDLDIRECT"   Wt Readings from Last 3 Encounters:  02/17/22 242 lb (109.8 kg)  02/04/22 239 lb 6.4 oz (108.6 kg)  12/16/21 242 lb (109.8 kg)      Other studies Reviewed: Additional studies/ records that were reviewed today include: TTE 11/28/2020 Review of the above records today demonstrates:   1. Left ventricular ejection  fraction, by estimation, is 50%. The left  ventricle has low normal function. The left ventricle demonstrates  regional wall motion abnormalities (see scoring diagram/findings for  description). The left ventricular internal  cavity size was mildly dilated. Left ventricular diastolic parameters are  indeterminate.   2. Right ventricular systolic function is mildly reduced. The right  ventricular size is normal. There is normal pulmonary artery systolic  pressure. The estimated right ventricular systolic pressure is 75.1 mmHg.   3. Systolic and diastolic MR seen. The mitral valve is grossly normal.  Mild mitral valve regurgitation. No evidence of mitral stenosis.   4. Systolic and diastolic TR seen. Mild TR.   5. The aortic valve is grossly normal. There is mild calcification of the  aortic valve. Aortic valve regurgitation is not visualized. No aortic  stenosis is present.   6. The inferior vena cava is dilated in size with <50% respiratory  variability, suggesting right atrial pressure of 15 mmHg.    ASSESSMENT AND PLAN:  1.  Complete heart block: Status post Medtronic dual-chamber pacemaker implanted 11/28/2020.  Device functioning appropriately.  No changes at this time.  2.  Coronary artery disease:  Status post multiple stents.  No current chest pain.  Plan per primary cardiology.  3.  Obstructive sleep apnea: CPAP compliance encouraged  4.  Abdominal aortic aneurysm: Status postrepair.  Followed by vascular surgery.  Current medicines are reviewed at length with the patient today.   The patient does not have concerns regarding his medicines.  The following changes were made today:  none  Labs/ tests ordered today include:  Orders Placed This Encounter  Procedures   EKG 12-Lead     Disposition:   FU 12 months  Signed, Yoandri Congrove Meredith Leeds, MD  02/17/2022 9:31 AM     [facility] [address] [place] [place] [address] [phone] (office) [phone] (fax)

## 2022-02-22 DIAGNOSIS — H40033 Anatomical narrow angle, bilateral: Secondary | ICD-10-CM | POA: Diagnosis not present

## 2022-02-22 DIAGNOSIS — H2513 Age-related nuclear cataract, bilateral: Secondary | ICD-10-CM | POA: Diagnosis not present

## 2022-03-01 ENCOUNTER — Ambulatory Visit (INDEPENDENT_AMBULATORY_CARE_PROVIDER_SITE_OTHER): Payer: Medicare HMO

## 2022-03-01 DIAGNOSIS — I442 Atrioventricular block, complete: Secondary | ICD-10-CM

## 2022-03-02 LAB — CUP PACEART REMOTE DEVICE CHECK
Battery Remaining Longevity: 135 mo
Battery Voltage: 3.04 V
Brady Statistic AP VP Percent: 6.62 %
Brady Statistic AP VS Percent: 0 %
Brady Statistic AS VP Percent: 93.05 %
Brady Statistic AS VS Percent: 0.33 %
Brady Statistic RA Percent Paced: 6.68 %
Brady Statistic RV Percent Paced: 99.67 %
Date Time Interrogation Session: 20240114205711
Implantable Lead Connection Status: 753985
Implantable Lead Connection Status: 753985
Implantable Lead Implant Date: 20221014
Implantable Lead Implant Date: 20221014
Implantable Lead Location: 753859
Implantable Lead Location: 753860
Implantable Lead Model: 3830
Implantable Lead Model: 5076
Implantable Pulse Generator Implant Date: 20221014
Lead Channel Impedance Value: 323 Ohm
Lead Channel Impedance Value: 361 Ohm
Lead Channel Impedance Value: 475 Ohm
Lead Channel Impedance Value: 475 Ohm
Lead Channel Pacing Threshold Amplitude: 0.625 V
Lead Channel Pacing Threshold Amplitude: 0.75 V
Lead Channel Pacing Threshold Pulse Width: 0.4 ms
Lead Channel Pacing Threshold Pulse Width: 0.4 ms
Lead Channel Sensing Intrinsic Amplitude: 21.75 mV
Lead Channel Sensing Intrinsic Amplitude: 21.75 mV
Lead Channel Sensing Intrinsic Amplitude: 5.625 mV
Lead Channel Sensing Intrinsic Amplitude: 5.625 mV
Lead Channel Setting Pacing Amplitude: 1.5 V
Lead Channel Setting Pacing Amplitude: 2 V
Lead Channel Setting Pacing Pulse Width: 0.4 ms
Lead Channel Setting Sensing Sensitivity: 0.9 mV
Zone Setting Status: 755011
Zone Setting Status: 755011

## 2022-03-03 ENCOUNTER — Other Ambulatory Visit: Payer: Self-pay | Admitting: Vascular Surgery

## 2022-03-03 ENCOUNTER — Ambulatory Visit
Admission: RE | Admit: 2022-03-03 | Discharge: 2022-03-03 | Disposition: A | Discharge status: Home / Self Care | Payer: Self-pay | Source: Ambulatory Visit | Attending: Vascular Surgery | Admitting: Vascular Surgery

## 2022-03-03 ENCOUNTER — Ambulatory Visit
Admission: RE | Admit: 2022-03-03 | Discharge: 2022-03-03 | Disposition: A | Discharge status: Home / Self Care | Payer: Medicare HMO | Source: Ambulatory Visit | Attending: Vascular Surgery | Admitting: Vascular Surgery

## 2022-03-03 DIAGNOSIS — I712 Thoracic aortic aneurysm, without rupture, unspecified: Secondary | ICD-10-CM | POA: Diagnosis not present

## 2022-03-03 DIAGNOSIS — I714 Abdominal aortic aneurysm, without rupture, unspecified: Secondary | ICD-10-CM | POA: Diagnosis not present

## 2022-03-03 DIAGNOSIS — I7102 Dissection of abdominal aorta: Secondary | ICD-10-CM | POA: Diagnosis not present

## 2022-03-03 DIAGNOSIS — S22069A Unspecified fracture of T7-T8 vertebra, initial encounter for closed fracture: Secondary | ICD-10-CM | POA: Diagnosis not present

## 2022-03-03 DIAGNOSIS — K5730 Diverticulosis of large intestine without perforation or abscess without bleeding: Secondary | ICD-10-CM | POA: Diagnosis not present

## 2022-03-03 MED ORDER — IOPAMIDOL (ISOVUE-370) INJECTION 76%
100.0000 mL | Freq: Once | INTRAVENOUS | Status: AC | PRN
  Administered 2022-03-03: 100 mL via INTRAVENOUS

## 2022-03-10 ENCOUNTER — Ambulatory Visit (INDEPENDENT_AMBULATORY_CARE_PROVIDER_SITE_OTHER): Payer: PPO | Admitting: Vascular Surgery

## 2022-03-10 ENCOUNTER — Encounter: Payer: Self-pay | Admitting: Vascular Surgery

## 2022-03-10 VITALS — BP 98/61 | HR 108 | Temp 97.7°F | Ht 66.0 in | Wt 243.4 lb

## 2022-03-10 DIAGNOSIS — Z9889 Other specified postprocedural states: Secondary | ICD-10-CM | POA: Diagnosis not present

## 2022-03-10 DIAGNOSIS — Z8679 Personal history of other diseases of the circulatory system: Secondary | ICD-10-CM | POA: Diagnosis not present

## 2022-03-10 DIAGNOSIS — I712 Thoracic aortic aneurysm, without rupture, unspecified: Secondary | ICD-10-CM
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
## 2022-03-16 ENCOUNTER — Other Ambulatory Visit: Payer: Self-pay | Admitting: Cardiology

## 2022-03-18 DIAGNOSIS — H25812 Combined forms of age-related cataract, left eye: Secondary | ICD-10-CM | POA: Diagnosis not present

## 2022-03-25 ENCOUNTER — Other Ambulatory Visit: Payer: Self-pay | Admitting: Cardiology

## 2022-04-02 DIAGNOSIS — H25812 Combined forms of age-related cataract, left eye: Secondary | ICD-10-CM | POA: Diagnosis not present

## 2022-04-02 DIAGNOSIS — H269 Unspecified cataract: Secondary | ICD-10-CM | POA: Diagnosis not present

## 2022-04-08 DIAGNOSIS — K589 Irritable bowel syndrome without diarrhea: Secondary | ICD-10-CM | POA: Diagnosis not present

## 2022-04-08 DIAGNOSIS — D6869 Other thrombophilia: Secondary | ICD-10-CM | POA: Diagnosis not present

## 2022-04-08 DIAGNOSIS — I10 Essential (primary) hypertension: Secondary | ICD-10-CM | POA: Diagnosis not present

## 2022-04-08 DIAGNOSIS — Z299 Encounter for prophylactic measures, unspecified: Secondary | ICD-10-CM | POA: Diagnosis not present

## 2022-04-08 DIAGNOSIS — H6122 Impacted cerumen, left ear: Secondary | ICD-10-CM | POA: Diagnosis not present

## 2022-04-12 NOTE — Progress Notes (Signed)
Remote pacemaker transmission.   

## 2022-04-13 DIAGNOSIS — H6123 Impacted cerumen, bilateral: Secondary | ICD-10-CM | POA: Diagnosis not present

## 2022-04-13 DIAGNOSIS — Z299 Encounter for prophylactic measures, unspecified: Secondary | ICD-10-CM | POA: Diagnosis not present

## 2022-04-13 DIAGNOSIS — I10 Essential (primary) hypertension: Secondary | ICD-10-CM | POA: Diagnosis not present

## 2022-04-23 ENCOUNTER — Ambulatory Visit: Payer: Medicare HMO | Admitting: Pulmonary Disease

## 2022-04-23 DIAGNOSIS — H2511 Age-related nuclear cataract, right eye: Secondary | ICD-10-CM | POA: Diagnosis not present

## 2022-04-23 DIAGNOSIS — H269 Unspecified cataract: Secondary | ICD-10-CM | POA: Diagnosis not present

## 2022-04-30 DIAGNOSIS — H04123 Dry eye syndrome of bilateral lacrimal glands: Secondary | ICD-10-CM | POA: Diagnosis not present

## 2022-05-21 DIAGNOSIS — H04123 Dry eye syndrome of bilateral lacrimal glands: Secondary | ICD-10-CM | POA: Diagnosis not present

## 2022-05-31 ENCOUNTER — Ambulatory Visit (INDEPENDENT_AMBULATORY_CARE_PROVIDER_SITE_OTHER): Payer: PPO

## 2022-05-31 DIAGNOSIS — T148XXA Other injury of unspecified body region, initial encounter: Secondary | ICD-10-CM | POA: Diagnosis not present

## 2022-05-31 DIAGNOSIS — I10 Essential (primary) hypertension: Secondary | ICD-10-CM | POA: Diagnosis not present

## 2022-05-31 DIAGNOSIS — Z299 Encounter for prophylactic measures, unspecified: Secondary | ICD-10-CM | POA: Diagnosis not present

## 2022-05-31 DIAGNOSIS — Z6841 Body Mass Index (BMI) 40.0 and over, adult: Secondary | ICD-10-CM | POA: Diagnosis not present

## 2022-05-31 DIAGNOSIS — I442 Atrioventricular block, complete: Secondary | ICD-10-CM

## 2022-06-01 LAB — CUP PACEART REMOTE DEVICE CHECK
Battery Remaining Longevity: 130 mo
Battery Voltage: 3.03 V
Brady Statistic AP VP Percent: 4.1 %
Brady Statistic AP VS Percent: 0 %
Brady Statistic AS VP Percent: 95.73 %
Brady Statistic AS VS Percent: 0.15 %
Brady Statistic RA Percent Paced: 3.97 %
Brady Statistic RV Percent Paced: 99.85 %
Date Time Interrogation Session: 20240415011900
Implantable Lead Connection Status: 753985
Implantable Lead Connection Status: 753985
Implantable Lead Implant Date: 20221014
Implantable Lead Implant Date: 20221014
Implantable Lead Location: 753859
Implantable Lead Location: 753860
Implantable Lead Model: 3830
Implantable Lead Model: 5076
Implantable Pulse Generator Implant Date: 20221014
Lead Channel Impedance Value: 304 Ohm
Lead Channel Impedance Value: 361 Ohm
Lead Channel Impedance Value: 437 Ohm
Lead Channel Impedance Value: 437 Ohm
Lead Channel Pacing Threshold Amplitude: 0.75 V
Lead Channel Pacing Threshold Amplitude: 0.875 V
Lead Channel Pacing Threshold Pulse Width: 0.4 ms
Lead Channel Pacing Threshold Pulse Width: 0.4 ms
Lead Channel Sensing Intrinsic Amplitude: 21.75 mV
Lead Channel Sensing Intrinsic Amplitude: 21.75 mV
Lead Channel Sensing Intrinsic Amplitude: 6 mV
Lead Channel Sensing Intrinsic Amplitude: 6 mV
Lead Channel Setting Pacing Amplitude: 1.5 V
Lead Channel Setting Pacing Amplitude: 2 V
Lead Channel Setting Pacing Pulse Width: 0.4 ms
Lead Channel Setting Sensing Sensitivity: 0.9 mV
Zone Setting Status: 755011
Zone Setting Status: 755011

## 2022-06-11 DIAGNOSIS — Z299 Encounter for prophylactic measures, unspecified: Secondary | ICD-10-CM | POA: Diagnosis not present

## 2022-06-11 DIAGNOSIS — I509 Heart failure, unspecified: Secondary | ICD-10-CM | POA: Diagnosis not present

## 2022-06-11 DIAGNOSIS — I10 Essential (primary) hypertension: Secondary | ICD-10-CM | POA: Diagnosis not present

## 2022-06-11 DIAGNOSIS — M25551 Pain in right hip: Secondary | ICD-10-CM | POA: Diagnosis not present

## 2022-06-11 DIAGNOSIS — I4891 Unspecified atrial fibrillation: Secondary | ICD-10-CM | POA: Diagnosis not present

## 2022-06-15 DIAGNOSIS — G4733 Obstructive sleep apnea (adult) (pediatric): Secondary | ICD-10-CM | POA: Diagnosis not present

## 2022-06-17 ENCOUNTER — Encounter: Payer: Self-pay | Admitting: Pulmonary Disease

## 2022-06-17 ENCOUNTER — Ambulatory Visit (INDEPENDENT_AMBULATORY_CARE_PROVIDER_SITE_OTHER): Payer: PPO | Admitting: Pulmonary Disease

## 2022-06-17 VITALS — BP 92/57 | HR 100 | Ht 66.0 in | Wt 238.4 lb

## 2022-06-17 DIAGNOSIS — G4733 Obstructive sleep apnea (adult) (pediatric): Secondary | ICD-10-CM | POA: Diagnosis not present

## 2022-06-17 DIAGNOSIS — I4891 Unspecified atrial fibrillation: Secondary | ICD-10-CM | POA: Diagnosis not present

## 2022-06-17 DIAGNOSIS — J449 Chronic obstructive pulmonary disease, unspecified: Secondary | ICD-10-CM | POA: Diagnosis not present

## 2022-06-17 DIAGNOSIS — I5032 Chronic diastolic (congestive) heart failure: Secondary | ICD-10-CM

## 2022-06-17 DIAGNOSIS — D6869 Other thrombophilia: Secondary | ICD-10-CM | POA: Diagnosis not present

## 2022-06-17 DIAGNOSIS — M25551 Pain in right hip: Secondary | ICD-10-CM | POA: Diagnosis not present

## 2022-06-17 DIAGNOSIS — I10 Essential (primary) hypertension: Secondary | ICD-10-CM | POA: Diagnosis not present

## 2022-06-17 DIAGNOSIS — Z299 Encounter for prophylactic measures, unspecified: Secondary | ICD-10-CM | POA: Diagnosis not present

## 2022-06-17 MED ORDER — TRELEGY ELLIPTA 100-62.5-25 MCG/ACT IN AEPB: 1.0000 | INHALATION_SPRAY | Freq: Every day | RESPIRATORY_TRACT | 0 refills | Status: DC

## 2022-06-17 NOTE — Patient Instructions (Addendum)
X Trial of Trelegy 100 - call to report in 2 weeks  X blood work today- BMET, CBC, BNP  X check echocardiogram  X Rx for autoCPAP 10-16 cm to DME

## 2022-06-17 NOTE — Progress Notes (Signed)
   Subjective:    Patient ID: Jonathan Cater., male    DOB: 09/13/1945, 77 y.o.   MRN: [id number]  HPI  77  yo  obese smoker for FU of COPD & OSA   PMH - chronic diastolic heart failure  s/p stent graft repair for thoracic aortic aneurysm with extension into the left common iliac artery on 10/26/2017.  09/2020 NSTEMI, cardiogenic shock, PCI to RCA, EF 25% recovered to 50% -PAF -CHB s/p PPM -11/2019 -Arthralgias of both hands, knees and ankles with cramps of his hands  paresthesias in his feet >> referred to rheumatology, treated for gout  -Smoked 20 pack years before he quit 2022  Chief Complaint  Patient presents with   Follow-up    Pt f/u for OSA but he is also complaining of increased SOB since LOV.   46m FU visit  He reports increasing dyspnea for the last 6 months.  He is short of breath walking to the truck and even when he bends down. He reports throat clearing and cough. His weight is unchanged.  If he skips his water pill even for a day he develops pedal edema He quit smoking 2 years ago. He does not want prednisone because this causes severe constipation. He is using a loaner CPAP machine.  He sleeps well with his CPAP he is very compliant and denies any problems with mask or pressure   Previous CPAP download on auto settings 4-16 cm shows good control of events on avg pr 13 & max pr 15cm    Significant tests/ events reviewed     HST 04/2017 (apnealink) AHI 29/h, lowest sat 77% PFTs 12/2019 ratio 69, FEV1 2.36/87%, FVC 90%, TLC normal, DLCO 17.56/76%, mild airway obstruction   Spirometry 02/2018 no evidence of airway obstruction with ratio of 80, FEV1 84% FVC of 76%   CTA C/A/P 02/2022 >> Mild  progression of aneurysmal disease -aorta, common iliac, new T8 fracture     -   11/19/2019  :    alpha one AT phenotype  Review of Systems neg for any significant sore throat, dysphagia, itching, sneezing, nasal congestion or excess/ purulent secretions, fever, chills,  sweats, unintended wt loss, pleuritic or exertional cp, hempoptysis, orthopnea pnd or change in chronic leg swelling. Also denies presyncope, palpitations, heartburn, abdominal pain, nausea, vomiting, diarrhea or change in bowel or urinary habits, dysuria,hematuria, rash, arthralgias, visual complaints, headache, numbness weakness or ataxia.     Objective:   Physical Exam   Gen. Pleasant, obese, in no distress ENT - no lesions, no post nasal drip Neck: No JVD, no thyromegaly, no carotid bruits Lungs: no use of accessory muscles, no dullness to percussion, faint expiratory rhonchi  Cardiovascular: Rhythm regular, heart sounds  normal, no murmurs or gallops, 1+ peripheral edema Musculoskeletal: No deformities, no cyanosis or clubbing , no tremors        Assessment & Plan:

## 2022-06-17 NOTE — Assessment & Plan Note (Signed)
I am not able to pinpoint whether it is the heart or the lungs that is making him more short of breath.  His weight is unchanged and he has minimal edema.  He is compliant with his torsemide.  He is maintained on apixaban. Will check blood work including BNP We will also repeat echocardiogram given his extensive cardiac history He has a cardiac follow-up in 1 month

## 2022-06-17 NOTE — Assessment & Plan Note (Signed)
He does not clearly seem to be having a COPD exacerbation.  He states that this is proceeding pollen season and he is convinced this is not allergies.  Prednisone caused severe constipation and would like to avoid. His lung function was not bad in the past he has quit smoking for 2 years. Will give him a sample of Trelegy and see if this improves his breathing

## 2022-06-17 NOTE — Addendum Note (Signed)
Addended by: Jaynee Eagles on: 06/17/2022 04:29 PM   Modules accepted: Orders

## 2022-06-17 NOTE — Assessment & Plan Note (Signed)
CPAP download was reviewed which shows excellent control of events on auto settings 4 to 16 cm with average pressure of 13 maximum pressure of 14 cm.  He has a Industrial/product designer. We will provide him with a new auto CPAP 10 to 16 cm. He is very compliant and CPAP is certainly helped improve his daytime somnolence and fatigue  Weight loss encouraged, compliance with goal of at least 4-6 hrs every night is the expectation. Advised against medications with sedative side effects Cautioned against driving when sleepy - understanding that sleepiness will vary on a day to day basis

## 2022-06-19 LAB — BASIC METABOLIC PANEL
BUN/Creatinine Ratio: 23 (ref 10–24)
BUN: 31 mg/dL — ABNORMAL HIGH (ref 8–27)
CO2: 22 mmol/L (ref 20–29)
Calcium: 9.6 mg/dL (ref 8.6–10.2)
Chloride: 102 mmol/L (ref 96–106)
Creatinine, Ser: 1.35 mg/dL — ABNORMAL HIGH (ref 0.76–1.27)
Glucose: 135 mg/dL — ABNORMAL HIGH (ref 70–99)
Potassium: 4.6 mmol/L (ref 3.5–5.2)
Sodium: 141 mmol/L (ref 134–144)
eGFR: 54 mL/min/{1.73_m2} — ABNORMAL LOW (ref >59)

## 2022-06-19 LAB — CBC WITH DIFFERENTIAL/PLATELET
Basophils Absolute: 0 10*3/uL (ref 0.0–0.2)
Basos: 1 %
EOS (ABSOLUTE): 0.2 10*3/uL (ref 0.0–0.4)
Eos: 2 %
Hematocrit: 44.6 % (ref 37.5–51.0)
Hemoglobin: 14.6 g/dL (ref 13.0–17.7)
Immature Grans (Abs): 0 10*3/uL (ref 0.0–0.1)
Immature Granulocytes: 0 %
Lymphocytes Absolute: 1.9 10*3/uL (ref 0.7–3.1)
Lymphs: 24 %
MCH: 30.4 pg (ref 26.6–33.0)
MCHC: 32.7 g/dL (ref 31.5–35.7)
MCV: 93 fL (ref 79–97)
Monocytes Absolute: 0.8 10*3/uL (ref 0.1–0.9)
Monocytes: 10 %
Neutrophils Absolute: 5 10*3/uL (ref 1.4–7.0)
Neutrophils: 63 %
Platelets: 267 10*3/uL (ref 150–450)
RBC: 4.81 x10E6/uL (ref 4.14–5.80)
RDW: 13.7 % (ref 11.6–15.4)
WBC: 7.9 10*3/uL (ref 3.4–10.8)

## 2022-06-19 LAB — BRAIN NATRIURETIC PEPTIDE: BNP: 29.6 pg/mL (ref 0.0–100.0)

## 2022-06-28 DIAGNOSIS — M47816 Spondylosis without myelopathy or radiculopathy, lumbar region: Secondary | ICD-10-CM | POA: Diagnosis not present

## 2022-06-28 DIAGNOSIS — M1611 Unilateral primary osteoarthritis, right hip: Secondary | ICD-10-CM | POA: Diagnosis not present

## 2022-06-29 ENCOUNTER — Telehealth: Payer: Self-pay | Admitting: *Deleted

## 2022-06-29 ENCOUNTER — Other Ambulatory Visit (HOSPITAL_COMMUNITY): Payer: Self-pay | Admitting: Sports Medicine

## 2022-06-29 DIAGNOSIS — M25551 Pain in right hip: Secondary | ICD-10-CM

## 2022-06-29 NOTE — Telephone Encounter (Signed)
   Name: Loral Demarchi.  DOB: 06/30/1945  MRN: [id number]  Primary Cardiologist: Dina Rich, MD  Chart reviewed as part of pre-operative protocol coverage. Because of LAMOND RORIE Jr.'s past medical history and time since last visit, he will require a follow-up telephone visit in order to better assess preoperative cardiovascular risk.  Pre-op covering staff: - Please schedule appointment and call patient to inform them. If patient already had an upcoming appointment within acceptable timeframe, please add "pre-op clearance" to the appointment notes so provider is aware. - Please contact requesting surgeon's office via preferred method (i.e, phone, fax) to inform them of need for appointment prior to surgery.  Hip injections do not usually require holding anticoagulation, but patient may hold Eliquis for 1 day prior to injection if needed.    Sharlene Dory, PA-C  06/29/2022, 4:43 PM

## 2022-06-29 NOTE — Telephone Encounter (Signed)
   Pre-operative Risk Assessment    Patient Name: Jonathan Garner.  DOB: 1946/01/18 MRN: [id number]      Request for Surgical Clearance    Procedure:   RIGHT HIP INJECTION (DG-FLUORO)  Date of Surgery:  Clearance 07/06/22                                 Surgeon:  NOT LISTED Surgeon's Group or Practice Name:  TO BE DONE AT [facility] Phone number:  NOT LISTED; ATTN: [email or web address]- [email or web address] Fax number:  [phone]   Type of Clearance Requested:   - Medical  - Pharmacy:  Hold Clopidogrel (Plavix) and Apixaban (Eliquis)     Type of Anesthesia:  Not Indicated   Additional requests/questions:    Elpidio Anis   06/29/2022, 1:51 PM

## 2022-06-29 NOTE — Telephone Encounter (Signed)
I will send back to pre op APP and pre op pharm-d as request is asking to hold Plavix and Eliqus, though only recommendations from Eliquis have been given.

## 2022-06-29 NOTE — Telephone Encounter (Signed)
Patient with diagnosis of afib on Eliquis for anticoagulation.    Procedure: RIGHT HIP INJECTION (DG-FLUORO)  Date of procedure: 07/06/22   CHA2DS2-VASc Score = 5   This indicates a 7.2% annual risk of stroke. The patient's score is based upon: CHF History: 1 HTN History: 1 Diabetes History: 0 Stroke History: 0 Vascular Disease History: 1 Age Score: 2 Gender Score: 0      CrCl 52 ml/min  Hip injections do not usually require holding anticoagulation, but patient may hold Eliquis for 1 day prior to injection if needed.  **This guidance is not considered finalized until pre-operative APP has relayed final recommendations.**

## 2022-06-30 ENCOUNTER — Telehealth: Payer: Self-pay | Admitting: *Deleted

## 2022-06-30 NOTE — Telephone Encounter (Signed)
S/w pt to schedule tele phone clearance. Med rec and consent done.     Patient Consent for Virtual Visit         Jonathan Garner. has provided verbal consent on 06/30/2022 for a virtual visit (video or telephone).   CONSENT FOR VIRTUAL VISIT FOR:  Jonathan Garner.  By participating in this virtual visit I agree to the following:  I hereby voluntarily request, consent and authorize [facility] and its employed or contracted physicians, physician assistants, nurse practitioners or other licensed health care professionals (the Practitioner), to provide me with telemedicine health care services (the "Services") as deemed necessary by the treating Practitioner. I acknowledge and consent to receive the Services by the Practitioner via telemedicine. I understand that the telemedicine visit will involve communicating with the Practitioner through live audiovisual communication technology and the disclosure of certain medical information by electronic transmission. I acknowledge that I have been given the opportunity to request an in-person assessment or other available alternative prior to the telemedicine visit and am voluntarily participating in the telemedicine visit.  I understand that I have the right to withhold or withdraw my consent to the use of telemedicine in the course of my care at any time, without affecting my right to future care or treatment, and that the Practitioner or I may terminate the telemedicine visit at any time. I understand that I have the right to inspect all information obtained and/or recorded in the course of the telemedicine visit and may receive copies of available information for a reasonable fee.  I understand that some of the potential risks of receiving the Services via telemedicine include:  Delay or interruption in medical evaluation due to technological equipment failure or disruption; Information transmitted may not be sufficient (e.g. poor  resolution of images) to allow for appropriate medical decision making by the Practitioner; and/or  In rare instances, security protocols could fail, causing a breach of personal health information.  Furthermore, I acknowledge that it is my responsibility to provide information about my medical history, conditions and care that is complete and accurate to the best of my ability. I acknowledge that Practitioner's advice, recommendations, and/or decision may be based on factors not within their control, such as incomplete or inaccurate data provided by me or distortions of diagnostic images or specimens that may result from electronic transmissions. I understand that the practice of medicine is not an exact science and that Practitioner makes no warranties or guarantees regarding treatment outcomes. I acknowledge that a copy of this consent can be made available to me via my patient portal [facility] MyChart), or I can request a printed copy by calling the office of [facility].    I understand that my insurance will be billed for this visit.   I have read or had this consent read to me. I understand the contents of this consent, which adequately explains the benefits and risks of the Services being provided via telemedicine.  I have been provided ample opportunity to ask questions regarding this consent and the Services and have had my questions answered to my satisfaction. I give my informed consent for the services to be provided through the use of telemedicine in my medical care

## 2022-06-30 NOTE — Telephone Encounter (Signed)
Primary Cardiologist:Branch, Christiane Ha, MD   Preoperative team, please contact this patient and set up a phone call appointment for further preoperative risk assessment. Please obtain consent and complete medication review. Thank you for your help.   Pending no s/s of ACS, he may hold Plavix for 5 days prior to injection. If possible, would recommend that he take aspirin during the perioperative period. I confirm that guidance regarding oral anticoagulation therapy has been completed and, if necessary, noted below.   Levi Aland, NP-C  06/30/2022, 8:14 AM [address] Office [phone] Fax [phone]

## 2022-07-02 ENCOUNTER — Other Ambulatory Visit: Payer: Self-pay | Admitting: Cardiology

## 2022-07-02 ENCOUNTER — Encounter: Payer: Self-pay | Admitting: Nurse Practitioner

## 2022-07-02 ENCOUNTER — Ambulatory Visit: Payer: PPO | Attending: Internal Medicine | Admitting: Nurse Practitioner

## 2022-07-02 DIAGNOSIS — Z01810 Encounter for preprocedural cardiovascular examination: Secondary | ICD-10-CM | POA: Diagnosis not present

## 2022-07-02 NOTE — Progress Notes (Addendum)
Virtual Visit via Telephone Note   Because of Jonathan JARMIN Jr.'s co-morbid illnesses, he is at least at moderate risk for complications without adequate follow up.  This format is felt to be most appropriate for this patient at this time.  The patient did not have access to video technology/had technical difficulties with video requiring transitioning to audio format only (telephone).  All issues noted in this document were discussed and addressed.  No physical exam could be performed with this format.  Please refer to the patient's chart for his consent to telehealth for [facility].  Evaluation Performed:  Preoperative cardiovascular risk assessment _____________   Date:  07/02/2022   Patient ID:  Jonathan Cater., DOB Jan 06, 1946, MRN [id number] Patient Location:  Home Provider location:   Office  Primary Care Provider:  Orlene Plum, NP Primary Cardiologist:  Dina Rich, MD  Chief Complaint / Patient Profile   77 y.o. 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  History of Present Illness    Jonathan Searl. is a 77 y.o. male who presents via audio/video conferencing for a telehealth visit today.  Pt was last seen in cardiology clinic on 02/17/22 by Dr. Elberta Fortis.  At that time Jonathan Cater. was doing well.  The patient is now pending procedure as outlined above. Since his last visit, he denies chest pain, lower extremity edema, fatigue, palpitations, melena, hematuria, hemoptysis, diaphoresis, weakness, presyncope, syncope. He reports chronic shortness of breath that occurs with moderate exertion. Reports hip pain is causing him not to be able to walk far without stopping. He denies orthopnea, edema, and PND. Weight has  been stable. He has an echocardiogram scheduled for 07/22/22. He is able to achieve > 4 METS activity without significant symptoms.   Past Medical History    Past Medical History:  Diagnosis Date   AAA (abdominal aortic aneurysm) (HCC)    CAD (coronary artery disease)    CHF (congestive heart failure) (HCC)    Diverticulosis    Dyspnea    W/ EXERTION    Gout    Gynecomastia    Hx of emphysema (HCC)    Hyperlipidemia    Hypertension    Lumbar spinal stenosis    OSA (obstructive sleep apnea)    Pedal edema    Past Surgical History:  Procedure Laterality Date   ABDOMINAL AORTIC ANEURYSM REPAIR  2013 and 2018   BACK SURGERY     CARDIAC CATHETERIZATION  04/2016   [place], [place]   ELBOW ARTHROSCOPY Left 2011   I & D EXTREMITY Right 08/23/2020   Procedure: IRRIGATION AND DEBRIDEMENT HAND;  Surgeon: Mack Hook, MD;  Location: [facility] OR;  Service: Orthopedics;  Laterality: Right;  Wound VAC application   I & D EXTREMITY Right 08/26/2020   Procedure: IRRIGATION AND DEBRIDEMENT RIGHT HAND;  Surgeon: Allena Napoleon, MD;  Location: [facility] OR;  Service: Plastics;  Laterality: Right;   I & D EXTREMITY Right 08/29/2020   Procedure: INCISION AND DEBRIDEMENT, HAND;  Surgeon: Allena Napoleon, MD;  Location: [facility] OR;  Service: Plastics;  Laterality: Right;   NECK SURGERY     PACEMAKER IMPLANT N/A 11/28/2020   Procedure: PACEMAKER IMPLANT;  Surgeon: Regan Lemming, MD;  Location: [facility] INVASIVE CV LAB;  Service: Cardiovascular;  Laterality: N/A;   THORACIC AORTIC ENDOVASCULAR STENT GRAFT N/A 10/26/2017   Procedure: THORACIC AORTIC ENDOVASCULAR STENT GRAFT WITH ILIAC EXTENSIONS;  Surgeon: Larina Earthly, MD;  Location: [facility] OR;  Service: Vascular;  Laterality: N/A;  PATIENT WILL NEED SPINAL CORD DRAIN BY ANESTHESIA   VASECTOMY  1976    Allergies  Allergies  Allergen Reactions   Atorvastatin     Other reaction(s): joint pain Other reaction(s): joint pain     Home Medications    Prior to Admission  medications   Medication Sig Start Date End Date Taking? Authorizing Provider  acetaminophen (TYLENOL) 500 MG tablet Take 500 mg by mouth every 8 (eight) hours as needed for moderate pain.    [provider]  apixaban (ELIQUIS) 5 MG TABS tablet Take 1 tablet (5 mg total) by mouth 2 (two) times daily. 09/04/21   Graciella Freer, PA-C  atorvastatin (LIPITOR) 80 MG tablet TAKE 1 TABLET BY MOUTH AT BEDTIME 07/10/21   Antoine Poche, MD  Cholecalciferol (VITAMIN D3) 125 MCG (5000 UT) CAPS Take 5,000 Units by mouth daily.    [provider]  clopidogrel (PLAVIX) 75 MG tablet Take 1 tablet by mouth once daily 07/10/21   Antoine Poche, MD  Cyanocobalamin ([employer] VITAMIN B-12) 5000 MCG SUBL Place 5,000 mcg under the tongue daily.    [provider]  JARDIANCE 10 MG TABS tablet Take 1 tablet by mouth once daily 03/25/22   Antoine Poche, MD  metoprolol succinate (TOPROL XL) 25 MG 24 hr tablet Take 1 tablet (25 mg total) by mouth at bedtime. 09/04/21   Graciella Freer, PA-C  nitroGLYCERIN (NITROSTAT) 0.4 MG SL tablet Place 1 tablet (0.4 mg total) under the tongue every 5 (five) minutes x 3 doses as needed for chest pain. 11/29/20   Strader, Lennart Pall, PA-C  potassium chloride SA (KLOR-CON M20) 20 MEQ tablet Take 1 tablet by mouth once daily 01/13/22   Antoine Poche, MD  sacubitril-valsartan Sherryll Burger) 24-26 MG Take 1 tablet by mouth twice daily 01/04/22   Antoine Poche, MD  spironolactone (ALDACTONE) 25 MG tablet Take 0.5 tablets (12.5 mg total) by mouth daily. 08/25/21   Antoine Poche, MD  torsemide [facility]) 20 MG tablet Take 1 tablet by mouth once daily 03/16/22   Antoine Poche, MD    Physical Exam    Vital Signs:  Jonathan Cater. does not have vital signs available for review today.  Given telephonic nature of communication, physical exam is limited. AAOx3. NAD. Normal affect.  Speech and respirations are unlabored.  Accessory  Clinical Findings    None  Assessment & Plan    1.  Preoperative Cardiovascular Risk Assessment:According to the Revised Cardiac Risk Index (RCRI), his Perioperative Risk of Major Cardiac Event is (%): 0.9. His Functional Capacity in METs is: 6.61 according to the Duke Activity Status Index (DASI). The patient is doing well from a cardiac perspective. Therefore, based on ACC/AHA guidelines, the patient would be at acceptable risk for the planned procedure without further cardiovascular testing.   The patient was advised that if he develops new symptoms prior to surgery to contact our office to arrange for a follow-up visit, and he verbalized understanding.  Hip injections do not usually require holding anticoagulation, but patient may hold Eliquis for 1 day prior to injection if needed.  he may hold Plavix for 5 days prior to injection. If  possible, would recommend that he take aspirin during the perioperative period.    A copy of this note will be routed to requesting surgeon.  Time:   Today, I have spent 10 minutes with the patient with telehealth technology discussing medical history, symptoms, and management plan.    Levi Aland, NP-C  07/02/2022, 3:18 PM [address] Office [phone] Fax [phone]

## 2022-07-02 NOTE — Progress Notes (Signed)
Remote pacemaker transmission.   

## 2022-07-06 ENCOUNTER — Encounter (HOSPITAL_COMMUNITY): Payer: Self-pay

## 2022-07-06 ENCOUNTER — Ambulatory Visit (HOSPITAL_COMMUNITY)
Admission: RE | Admit: 2022-07-06 | Discharge: 2022-07-06 | Disposition: A | Discharge status: Home / Self Care | Payer: PPO | Source: Ambulatory Visit | Attending: Sports Medicine | Admitting: Sports Medicine

## 2022-07-06 DIAGNOSIS — M25551 Pain in right hip: Secondary | ICD-10-CM | POA: Diagnosis not present

## 2022-07-06 MED ORDER — IOHEXOL 180 MG/ML  SOLN
10.0000 mL | Freq: Once | INTRAMUSCULAR | Status: AC | PRN
  Administered 2022-07-06: 10 mL via INTRA_ARTICULAR

## 2022-07-06 MED ORDER — BUPIVACAINE HCL (PF) 0.5 % IJ SOLN
5.0000 mL | Freq: Once | INTRAMUSCULAR | Status: AC
  Administered 2022-07-06: 5 mL via INTRA_ARTICULAR

## 2022-07-06 MED ORDER — TRIAMCINOLONE ACETONIDE 40 MG/ML IJ SUSP (RADIOLOGY)
40.0000 mg | Freq: Once | INTRAMUSCULAR | Status: AC
  Administered 2022-07-06: 40 mg via INTRA_ARTICULAR

## 2022-07-06 MED ORDER — BUPIVACAINE HCL (PF) 0.5 % IJ SOLN
INTRAMUSCULAR | Status: AC
  Filled 2022-07-06: qty 30

## 2022-07-06 MED ORDER — LIDOCAINE HCL (PF) 1 % IJ SOLN
INTRAMUSCULAR | Status: AC
  Filled 2022-07-06: qty 5

## 2022-07-06 MED ORDER — TRIAMCINOLONE ACETONIDE 40 MG/ML IJ SUSP
INTRAMUSCULAR | Status: AC
  Filled 2022-07-06: qty 1

## 2022-07-06 MED ORDER — LIDOCAINE HCL (PF) 1 % IJ SOLN
5.0000 mL | Freq: Once | INTRAMUSCULAR | Status: AC
  Administered 2022-07-06: 5 mL via INTRADERMAL

## 2022-07-06 NOTE — Progress Notes (Signed)
Patient tolerated fluoro guided right hip injection well today and ambulatory with cane at departure with no acute distress noted. Patient verbalized understanding of discharge instructions.

## 2022-07-19 DIAGNOSIS — M1611 Unilateral primary osteoarthritis, right hip: Secondary | ICD-10-CM | POA: Diagnosis not present

## 2022-07-21 ENCOUNTER — Encounter: Payer: Self-pay | Admitting: *Deleted

## 2022-07-21 ENCOUNTER — Ambulatory Visit: Payer: PPO | Attending: Cardiology | Admitting: Cardiology

## 2022-07-21 ENCOUNTER — Encounter: Payer: Self-pay | Admitting: Cardiology

## 2022-07-21 VITALS — BP 126/70 | HR 88 | Ht 66.0 in | Wt 239.0 lb

## 2022-07-21 DIAGNOSIS — I480 Paroxysmal atrial fibrillation: Secondary | ICD-10-CM

## 2022-07-21 DIAGNOSIS — I5022 Chronic systolic (congestive) heart failure: Secondary | ICD-10-CM

## 2022-07-21 DIAGNOSIS — E782 Mixed hyperlipidemia: Secondary | ICD-10-CM

## 2022-07-21 DIAGNOSIS — I442 Atrioventricular block, complete: Secondary | ICD-10-CM | POA: Diagnosis not present

## 2022-07-21 DIAGNOSIS — I2510 Atherosclerotic heart disease of native coronary artery without angina pectoris: Secondary | ICD-10-CM

## 2022-07-21 NOTE — Patient Instructions (Addendum)
Medication Instructions:  Your physician recommends that you continue on your current medications as directed. Please refer to the Current Medication list given to you today.  Labwork: none  Testing/Procedures: none  Follow-Up: Your physician recommends that you schedule a follow-up appointment in: 2 months  Any Other Special Instructions Will Be Listed Below (If Applicable).  If you need a refill on your cardiac medications before your next appointment, please call your pharmacy. 
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
## 2022-07-22 ENCOUNTER — Ambulatory Visit (HOSPITAL_COMMUNITY)
Admission: RE | Admit: 2022-07-22 | Discharge: 2022-07-22 | Disposition: A | Discharge status: Home / Self Care | Payer: PPO | Source: Ambulatory Visit | Attending: Pulmonary Disease | Admitting: Pulmonary Disease

## 2022-07-22 DIAGNOSIS — R0602 Shortness of breath: Secondary | ICD-10-CM | POA: Diagnosis not present

## 2022-07-22 DIAGNOSIS — I5032 Chronic diastolic (congestive) heart failure: Secondary | ICD-10-CM

## 2022-07-22 LAB — ECHOCARDIOGRAM COMPLETE
Area-P 1/2: 3.77 cm2
S' Lateral: 4.5 cm

## 2022-07-22 NOTE — Progress Notes (Signed)
  Echocardiogram 2D Echocardiogram has been performed.  Delcie Roch 07/22/2022, 4:59 PM

## 2022-07-26 ENCOUNTER — Other Ambulatory Visit: Payer: Self-pay | Admitting: Cardiology

## 2022-07-26 ENCOUNTER — Telehealth: Payer: Self-pay | Admitting: Cardiology

## 2022-07-26 NOTE — Telephone Encounter (Signed)
Patient called stating he recently had an echo done.  He would like for Dr. Elberta Fortis to take a look a those results and to see if his "pacemaker is right".

## 2022-07-26 NOTE — Telephone Encounter (Signed)
Left message to call back  (Informed pt in message that I was forwarding to MD for review/advisement)

## 2022-07-26 NOTE — Telephone Encounter (Signed)
Pt aware MD to review and we will let him know. Pt agreeable

## 2022-07-30 NOTE — Telephone Encounter (Signed)
Called pt to inform him of Camnitz recommendation for OV to discuss possible upgrade to ICD.  Unaware if pt needs OV w/ Camnitz yet or if needs to have medication adjustment first before discussing device upgrade. Pt aware I will follow up next week once reviewed/advised by Dr. Elberta Fortis. Pt agreeable to plan.

## 2022-08-04 NOTE — Telephone Encounter (Signed)
Pt aware Dr. Elberta Fortis reviewed Monday. Recommending an OV to discuss device upgrade. Pt reports that he is SOB, ongoing for the last several months.  And that he needs hip surgery, but Dr. Wyline Mood told him he would need device/heart issues to be resolved before he could go though w/ hip surgery. Aware forwarding to EP scheduler to get pt scheduled in the next several weeks to discuss. Patient verbalized understanding and agreeable to plan.

## 2022-08-04 NOTE — Telephone Encounter (Signed)
Patient is calling to f/u regarding his previous conversation with RN due to not hearing back. Please advise.
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
## 2022-08-11 ENCOUNTER — Ambulatory Visit: Payer: PPO | Attending: Cardiology | Admitting: Cardiology

## 2022-08-11 ENCOUNTER — Encounter: Payer: Self-pay | Admitting: *Deleted

## 2022-08-11 ENCOUNTER — Encounter: Payer: Self-pay | Admitting: Cardiology

## 2022-08-11 VITALS — BP 98/54 | HR 83 | Ht 66.0 in | Wt 230.0 lb

## 2022-08-11 DIAGNOSIS — I480 Paroxysmal atrial fibrillation: Secondary | ICD-10-CM | POA: Diagnosis not present

## 2022-08-11 DIAGNOSIS — I442 Atrioventricular block, complete: Secondary | ICD-10-CM | POA: Diagnosis not present

## 2022-08-11 DIAGNOSIS — I509 Heart failure, unspecified: Secondary | ICD-10-CM | POA: Diagnosis not present

## 2022-08-11 DIAGNOSIS — Z01812 Encounter for preprocedural laboratory examination: Secondary | ICD-10-CM

## 2022-08-11 NOTE — Patient Instructions (Addendum)
Medication Instructions:  Your physician recommends that you continue on your current medications as directed. Please refer to the Current Medication list given to you today.  *If you need a refill on your cardiac medications before your next appointment, please call your pharmacy*   Lab Work: Today: CBC & BMET If you have labs (blood work) drawn today and your tests are completely normal, you will receive your results only by: MyChart Message (if you have MyChart) OR A paper copy in the mail If you have any lab test that is abnormal or we need to change your treatment, we will call you to review the results.   Testing/Procedures: Your physician has requested that you have a cardiac catheterization. Cardiac catheterization is used to diagnose and/or treat various heart conditions. Doctors may recommend this procedure for a number of different reasons. The most common reason is to evaluate chest pain. Chest pain can be a symptom of coronary artery disease (CAD), and cardiac catheterization can show whether plaque is narrowing or blocking your heart's arteries. This procedure is also used to evaluate the valves, as well as measure the blood flow and oxygen levels in different parts of your heart. For further information please visit [email or web address] Please follow instruction sheet, as given.    Follow-Up: At [facility], you and your health needs are our priority.  As part of our continuing mission to provide you with exceptional heart care, we have created designated Provider Care Teams.  These Care Teams include your primary Cardiologist (physician) and Advanced Practice Providers (APPs -  Physician Assistants and Nurse Practitioners) who all work together to provide you with the care you need, when you need it.  Your next appointment:   To be  determined  The format for your next appointment:   In Person  Provider:   Loman Brooklyn, MD{   Thank you for choosing [facility]!!   Dory Horn, RN [phone]  Other Instructions

## 2022-08-12 ENCOUNTER — Encounter (HOSPITAL_COMMUNITY)
Admission: RE | Disposition: A | Discharge status: Home / Self Care | Payer: Self-pay | Source: Home / Self Care | Attending: Internal Medicine

## 2022-08-12 ENCOUNTER — Other Ambulatory Visit: Payer: Self-pay

## 2022-08-12 ENCOUNTER — Ambulatory Visit (HOSPITAL_COMMUNITY)
Admission: RE | Admit: 2022-08-12 | Discharge: 2022-08-12 | Disposition: A | Discharge status: Home / Self Care | Payer: PPO | Attending: Internal Medicine | Admitting: Internal Medicine

## 2022-08-12 DIAGNOSIS — Z7901 Long term (current) use of anticoagulants: Secondary | ICD-10-CM | POA: Insufficient documentation

## 2022-08-12 DIAGNOSIS — I5023 Acute on chronic systolic (congestive) heart failure: Secondary | ICD-10-CM | POA: Insufficient documentation

## 2022-08-12 DIAGNOSIS — I110 Hypertensive heart disease with heart failure: Secondary | ICD-10-CM | POA: Insufficient documentation

## 2022-08-12 DIAGNOSIS — G4733 Obstructive sleep apnea (adult) (pediatric): Secondary | ICD-10-CM | POA: Insufficient documentation

## 2022-08-12 DIAGNOSIS — I429 Cardiomyopathy, unspecified: Secondary | ICD-10-CM | POA: Diagnosis not present

## 2022-08-12 DIAGNOSIS — Z955 Presence of coronary angioplasty implant and graft: Secondary | ICD-10-CM | POA: Diagnosis not present

## 2022-08-12 DIAGNOSIS — I442 Atrioventricular block, complete: Secondary | ICD-10-CM | POA: Diagnosis not present

## 2022-08-12 DIAGNOSIS — I480 Paroxysmal atrial fibrillation: Secondary | ICD-10-CM | POA: Diagnosis not present

## 2022-08-12 DIAGNOSIS — D6869 Other thrombophilia: Secondary | ICD-10-CM | POA: Diagnosis not present

## 2022-08-12 DIAGNOSIS — I2510 Atherosclerotic heart disease of native coronary artery without angina pectoris: Secondary | ICD-10-CM | POA: Diagnosis not present

## 2022-08-12 DIAGNOSIS — I252 Old myocardial infarction: Secondary | ICD-10-CM | POA: Diagnosis not present

## 2022-08-12 DIAGNOSIS — Z950 Presence of cardiac pacemaker: Secondary | ICD-10-CM | POA: Diagnosis not present

## 2022-08-12 HISTORY — PX: RIGHT/LEFT HEART CATH AND CORONARY ANGIOGRAPHY: CATH118266

## 2022-08-12 LAB — CBC
Hematocrit: 47.3 % (ref 37.5–51.0)
Hemoglobin: 15 g/dL (ref 13.0–17.7)
MCH: 30.1 pg (ref 26.6–33.0)
MCHC: 31.7 g/dL (ref 31.5–35.7)
MCV: 95 fL (ref 79–97)
Platelets: 229 10*3/uL (ref 150–450)
RBC: 4.98 x10E6/uL (ref 4.14–5.80)
RDW: 13.5 % (ref 11.6–15.4)
WBC: 8.2 10*3/uL (ref 3.4–10.8)

## 2022-08-12 LAB — BASIC METABOLIC PANEL
BUN/Creatinine Ratio: 23 (ref 10–24)
BUN: 31 mg/dL — ABNORMAL HIGH (ref 8–27)
CO2: 22 mmol/L (ref 20–29)
Calcium: 9.8 mg/dL (ref 8.6–10.2)
Chloride: 104 mmol/L (ref 96–106)
Creatinine, Ser: 1.37 mg/dL — ABNORMAL HIGH (ref 0.76–1.27)
Glucose: 97 mg/dL (ref 70–99)
Potassium: 5 mmol/L (ref 3.5–5.2)
Sodium: 143 mmol/L (ref 134–144)
eGFR: 53 mL/min/{1.73_m2} — ABNORMAL LOW (ref >59)

## 2022-08-12 LAB — POCT I-STAT EG7
Acid-base deficit: 1 mmol/L (ref 0.0–2.0)
Acid-base deficit: 2 mmol/L (ref 0.0–2.0)
Acid-base deficit: 2 mmol/L (ref 0.0–2.0)
Bicarbonate: 23.1 mmol/L (ref 20.0–28.0)
Bicarbonate: 23.4 mmol/L (ref 20.0–28.0)
Bicarbonate: 24.3 mmol/L (ref 20.0–28.0)
Calcium, Ion: 1.15 mmol/L (ref 1.15–1.40)
Calcium, Ion: 1.17 mmol/L (ref 1.15–1.40)
Calcium, Ion: 1.19 mmol/L (ref 1.15–1.40)
HCT: 40 % (ref 39.0–52.0)
HCT: 41 % (ref 39.0–52.0)
HCT: 41 % (ref 39.0–52.0)
Hemoglobin: 13.6 g/dL (ref 13.0–17.0)
Hemoglobin: 13.9 g/dL (ref 13.0–17.0)
Hemoglobin: 13.9 g/dL (ref 13.0–17.0)
O2 Saturation: 68 %
O2 Saturation: 70 %
O2 Saturation: 72 %
Potassium: 4.1 mmol/L (ref 3.5–5.1)
Potassium: 4.1 mmol/L (ref 3.5–5.1)
Potassium: 4.2 mmol/L (ref 3.5–5.1)
Sodium: 141 mmol/L (ref 135–145)
Sodium: 142 mmol/L (ref 135–145)
Sodium: 143 mmol/L (ref 135–145)
TCO2: 24 mmol/L (ref 22–32)
TCO2: 25 mmol/L (ref 22–32)
TCO2: 26 mmol/L (ref 22–32)
pCO2, Ven: 40.2 mmHg — ABNORMAL LOW (ref 44–60)
pCO2, Ven: 40.6 mmHg — ABNORMAL LOW (ref 44–60)
pCO2, Ven: 42.8 mmHg — ABNORMAL LOW (ref 44–60)
pH, Ven: 7.362 (ref 7.25–7.43)
pH, Ven: 7.367 (ref 7.25–7.43)
pH, Ven: 7.369 (ref 7.25–7.43)
pO2, Ven: 37 mmHg (ref 32–45)
pO2, Ven: 38 mmHg (ref 32–45)
pO2, Ven: 39 mmHg (ref 32–45)

## 2022-08-12 LAB — POCT I-STAT 7, (LYTES, BLD GAS, ICA,H+H)
Acid-base deficit: 3 mmol/L — ABNORMAL HIGH (ref 0.0–2.0)
Bicarbonate: 21.6 mmol/L (ref 20.0–28.0)
Calcium, Ion: 1.17 mmol/L (ref 1.15–1.40)
HCT: 41 % (ref 39.0–52.0)
Hemoglobin: 13.9 g/dL (ref 13.0–17.0)
O2 Saturation: 94 %
Potassium: 4.1 mmol/L (ref 3.5–5.1)
Sodium: 142 mmol/L (ref 135–145)
TCO2: 23 mmol/L (ref 22–32)
pCO2 arterial: 35.2 mmHg (ref 32–48)
pH, Arterial: 7.397 (ref 7.35–7.45)
pO2, Arterial: 69 mmHg — ABNORMAL LOW (ref 83–108)

## 2022-08-12 SURGERY — RIGHT/LEFT HEART CATH AND CORONARY ANGIOGRAPHY
Anesthesia: LOCAL

## 2022-08-12 MED ORDER — SODIUM CHLORIDE 0.9% FLUSH: 3.0000 mL | Freq: Two times a day (BID) | INTRAVENOUS | Status: DC

## 2022-08-12 MED ORDER — ACETAMINOPHEN 325 MG PO TABS: 650.0000 mg | ORAL_TABLET | ORAL | Status: DC | PRN

## 2022-08-12 MED ORDER — VERAPAMIL HCL 2.5 MG/ML IV SOLN
INTRAVENOUS | Status: AC
  Filled 2022-08-12: qty 2

## 2022-08-12 MED ORDER — LIDOCAINE HCL (PF) 1 % IJ SOLN
INTRAMUSCULAR | Status: AC
  Filled 2022-08-12: qty 30

## 2022-08-12 MED ORDER — SODIUM CHLORIDE 0.9% FLUSH: 3.0000 mL | INTRAVENOUS | Status: DC | PRN

## 2022-08-12 MED ORDER — HEPARIN (PORCINE) IN NACL 1000-0.9 UT/500ML-% IV SOLN
INTRAVENOUS | Status: DC | PRN
  Administered 2022-08-12 (×2): 500 mL

## 2022-08-12 MED ORDER — FENTANYL CITRATE (PF) 100 MCG/2ML IJ SOLN
INTRAMUSCULAR | Status: AC
  Filled 2022-08-12: qty 2

## 2022-08-12 MED ORDER — SODIUM CHLORIDE 0.9 % IV SOLN: INTRAVENOUS | Status: DC

## 2022-08-12 MED ORDER — HEPARIN SODIUM (PORCINE) 1000 UNIT/ML IJ SOLN
INTRAMUSCULAR | Status: DC | PRN
  Administered 2022-08-12: 5000 [IU] via INTRA_ARTERIAL

## 2022-08-12 MED ORDER — HYDRALAZINE HCL 20 MG/ML IJ SOLN: 10.0000 mg | INTRAMUSCULAR | Status: DC | PRN

## 2022-08-12 MED ORDER — ONDANSETRON HCL 4 MG/2ML IJ SOLN: 4.0000 mg | Freq: Four times a day (QID) | INTRAMUSCULAR | Status: DC | PRN

## 2022-08-12 MED ORDER — LIDOCAINE HCL (PF) 1 % IJ SOLN
INTRAMUSCULAR | Status: DC | PRN
  Administered 2022-08-12 (×2): 2 mL

## 2022-08-12 MED ORDER — IOHEXOL 350 MG/ML SOLN
INTRAVENOUS | Status: DC | PRN
  Administered 2022-08-12: 70 mL

## 2022-08-12 MED ORDER — MIDAZOLAM HCL 2 MG/2ML IJ SOLN
INTRAMUSCULAR | Status: AC
  Filled 2022-08-12: qty 2

## 2022-08-12 MED ORDER — ASPIRIN 81 MG PO CHEW: 81.0000 mg | CHEWABLE_TABLET | ORAL | Status: DC

## 2022-08-12 MED ORDER — FENTANYL CITRATE (PF) 100 MCG/2ML IJ SOLN
INTRAMUSCULAR | Status: DC | PRN
  Administered 2022-08-12: 25 ug via INTRAVENOUS

## 2022-08-12 MED ORDER — SODIUM CHLORIDE 0.9 % IV SOLN: 250.0000 mL | INTRAVENOUS | Status: DC | PRN

## 2022-08-12 MED ORDER — VERAPAMIL HCL 2.5 MG/ML IV SOLN
INTRAVENOUS | Status: DC | PRN
  Administered 2022-08-12: 10 mL via INTRA_ARTERIAL

## 2022-08-12 MED ORDER — HEPARIN SODIUM (PORCINE) 1000 UNIT/ML IJ SOLN
INTRAMUSCULAR | Status: AC
  Filled 2022-08-12: qty 10

## 2022-08-12 MED ORDER — MIDAZOLAM HCL 2 MG/2ML IJ SOLN
INTRAMUSCULAR | Status: DC | PRN
  Administered 2022-08-12: 1 mg via INTRAVENOUS

## 2022-08-12 MED ORDER — LABETALOL HCL 5 MG/ML IV SOLN: 10.0000 mg | INTRAVENOUS | Status: DC | PRN

## 2022-08-12 SURGICAL SUPPLY — 13 items
CATH BALLN WEDGE 5F 110CM (CATHETERS) IMPLANT
CATH INFINITI 6F ANG MULTIPACK (CATHETERS) IMPLANT
DEVICE RAD COMP TR BAND LRG (VASCULAR PRODUCTS) IMPLANT
ELECT DEFIB PAD ADLT CADENCE (PAD) IMPLANT
GLIDESHEATH SLEND SS 6F .021 (SHEATH) IMPLANT
KIT HEART LEFT (KITS) ×1 IMPLANT
PACK CARDIAC CATHETERIZATION (CUSTOM PROCEDURE TRAY) ×1 IMPLANT
SHEATH GLIDE SLENDER 4/5FR (SHEATH) IMPLANT
SHEATH PROBE COVER 6X72 (BAG) IMPLANT
SYR MEDRAD MARK 7 150ML (SYRINGE) ×1 IMPLANT
TRANSDUCER W/STOPCOCK (MISCELLANEOUS) ×1 IMPLANT
TUBING CIL FLEX 10 FLL-RA (TUBING) ×1 IMPLANT
WIRE EMERALD 3MM-J .035X260CM (WIRE) IMPLANT

## 2022-08-12 NOTE — Discharge Instructions (Addendum)
Stop Plavix.  Restart Eliquis tonight.

## 2022-08-12 NOTE — Interval H&P Note (Signed)
History and Physical Interval Note:  08/12/2022 6:46 AM  Jonathan Garner.  has presented today for surgery, with the diagnosis of heart failure.  The various methods of treatment have been discussed with the patient and family. After consideration of risks, benefits and other options for treatment, the patient has consented to  Procedure(s): RIGHT/LEFT HEART CATH AND CORONARY ANGIOGRAPHY (N/A) as a surgical intervention.  The patient's history has been reviewed, patient examined, no change in status, stable for surgery.  I have reviewed the patient's chart and labs.  Questions were answered to the patient's satisfaction.     Jonathan Garner

## 2022-08-13 ENCOUNTER — Encounter (HOSPITAL_COMMUNITY): Payer: Self-pay | Admitting: Internal Medicine

## 2022-08-27 DIAGNOSIS — I25118 Atherosclerotic heart disease of native coronary artery with other forms of angina pectoris: Secondary | ICD-10-CM | POA: Diagnosis not present

## 2022-08-27 DIAGNOSIS — Z299 Encounter for prophylactic measures, unspecified: Secondary | ICD-10-CM | POA: Diagnosis not present

## 2022-08-27 DIAGNOSIS — I509 Heart failure, unspecified: Secondary | ICD-10-CM | POA: Diagnosis not present

## 2022-08-27 DIAGNOSIS — Z6839 Body mass index (BMI) 39.0-39.9, adult: Secondary | ICD-10-CM | POA: Diagnosis not present

## 2022-08-27 DIAGNOSIS — Z7189 Other specified counseling: Secondary | ICD-10-CM | POA: Diagnosis not present

## 2022-08-27 DIAGNOSIS — Z0000 Encounter for general adult medical examination without abnormal findings: Secondary | ICD-10-CM | POA: Diagnosis not present

## 2022-08-27 DIAGNOSIS — Z1339 Encounter for screening examination for other mental health and behavioral disorders: Secondary | ICD-10-CM | POA: Diagnosis not present

## 2022-08-27 DIAGNOSIS — Z87891 Personal history of nicotine dependence: Secondary | ICD-10-CM | POA: Diagnosis not present

## 2022-08-27 DIAGNOSIS — I10 Essential (primary) hypertension: Secondary | ICD-10-CM | POA: Diagnosis not present

## 2022-08-27 DIAGNOSIS — M25551 Pain in right hip: Secondary | ICD-10-CM | POA: Diagnosis not present

## 2022-08-27 DIAGNOSIS — Z1331 Encounter for screening for depression: Secondary | ICD-10-CM | POA: Diagnosis not present

## 2022-08-29 ENCOUNTER — Other Ambulatory Visit: Payer: Self-pay | Admitting: Cardiology

## 2022-08-30 ENCOUNTER — Ambulatory Visit: Payer: Medicare HMO

## 2022-08-30 DIAGNOSIS — I442 Atrioventricular block, complete: Secondary | ICD-10-CM

## 2022-08-30 LAB — CUP PACEART REMOTE DEVICE CHECK
Battery Remaining Longevity: 129 mo
Battery Voltage: 3.02 V
Brady Statistic AP VP Percent: 6.95 %
Brady Statistic AP VS Percent: 0 %
Brady Statistic AS VP Percent: 93.03 %
Brady Statistic AS VS Percent: 0.02 %
Brady Statistic RA Percent Paced: 6.92 %
Brady Statistic RV Percent Paced: 99.98 %
Date Time Interrogation Session: 20240715065238
Implantable Lead Connection Status: 753985
Implantable Lead Connection Status: 753985
Implantable Lead Implant Date: 20221014
Implantable Lead Implant Date: 20221014
Implantable Lead Location: 753859
Implantable Lead Location: 753860
Implantable Lead Model: 3830
Implantable Lead Model: 5076
Implantable Pulse Generator Implant Date: 20221014
Lead Channel Impedance Value: 323 Ohm
Lead Channel Impedance Value: 361 Ohm
Lead Channel Impedance Value: 456 Ohm
Lead Channel Impedance Value: 475 Ohm
Lead Channel Pacing Threshold Amplitude: 0.75 V
Lead Channel Pacing Threshold Amplitude: 0.875 V
Lead Channel Pacing Threshold Pulse Width: 0.4 ms
Lead Channel Pacing Threshold Pulse Width: 0.4 ms
Lead Channel Sensing Intrinsic Amplitude: 21.75 mV
Lead Channel Sensing Intrinsic Amplitude: 21.75 mV
Lead Channel Sensing Intrinsic Amplitude: 6.375 mV
Lead Channel Sensing Intrinsic Amplitude: 6.375 mV
Lead Channel Setting Pacing Amplitude: 1.5 V
Lead Channel Setting Pacing Amplitude: 2 V
Lead Channel Setting Pacing Pulse Width: 0.4 ms
Lead Channel Setting Sensing Sensitivity: 0.9 mV
Zone Setting Status: 755011
Zone Setting Status: 755011

## 2022-08-31 ENCOUNTER — Telehealth: Payer: Self-pay | Admitting: Cardiology

## 2022-08-31 DIAGNOSIS — I5022 Chronic systolic (congestive) heart failure: Secondary | ICD-10-CM

## 2022-08-31 DIAGNOSIS — I5020 Unspecified systolic (congestive) heart failure: Secondary | ICD-10-CM

## 2022-08-31 NOTE — Telephone Encounter (Signed)
Pt informed that Dr. Elberta Fortis recommends a referral to the [facility], for HF optimization before determining if ICD is needed. Aware will forward to their clinic to schedule him. Aware I will keep track of when he sees them and have Camnitz review.  Will determine next steps w/ EP after he sees heart failure clinic. Patient verbalized understanding and agreeable to plan.  Referral placed

## 2022-08-31 NOTE — Telephone Encounter (Signed)
Patient is calling to discuss procedure that Dr. Elberta Fortis had recommended patient have done. Requesting call back to discuss.

## 2022-09-01 ENCOUNTER — Telehealth (HOSPITAL_COMMUNITY): Payer: Self-pay | Admitting: Vascular Surgery

## 2022-09-01 NOTE — Telephone Encounter (Signed)
Appt sch in AHF clinic

## 2022-09-01 NOTE — Telephone Encounter (Signed)
Lvm to make np appt

## 2022-09-02 ENCOUNTER — Encounter (HOSPITAL_COMMUNITY): Payer: Self-pay | Admitting: Cardiology

## 2022-09-02 ENCOUNTER — Other Ambulatory Visit (HOSPITAL_COMMUNITY): Payer: Self-pay

## 2022-09-02 ENCOUNTER — Ambulatory Visit (HOSPITAL_COMMUNITY)
Admission: RE | Admit: 2022-09-02 | Discharge: 2022-09-02 | Disposition: A | Discharge status: Home / Self Care | Payer: PPO | Source: Ambulatory Visit | Attending: Cardiology | Admitting: Cardiology

## 2022-09-02 VITALS — BP 108/68 | HR 75 | Wt 244.0 lb

## 2022-09-02 DIAGNOSIS — I5020 Unspecified systolic (congestive) heart failure: Secondary | ICD-10-CM | POA: Diagnosis not present

## 2022-09-02 DIAGNOSIS — I5022 Chronic systolic (congestive) heart failure: Secondary | ICD-10-CM | POA: Insufficient documentation

## 2022-09-02 DIAGNOSIS — I2510 Atherosclerotic heart disease of native coronary artery without angina pectoris: Secondary | ICD-10-CM

## 2022-09-02 DIAGNOSIS — I442 Atrioventricular block, complete: Secondary | ICD-10-CM

## 2022-09-02 DIAGNOSIS — I4892 Unspecified atrial flutter: Secondary | ICD-10-CM

## 2022-09-02 DIAGNOSIS — G4733 Obstructive sleep apnea (adult) (pediatric): Secondary | ICD-10-CM | POA: Diagnosis not present

## 2022-09-02 DIAGNOSIS — I5032 Chronic diastolic (congestive) heart failure: Secondary | ICD-10-CM | POA: Diagnosis not present

## 2022-09-02 DIAGNOSIS — I480 Paroxysmal atrial fibrillation: Secondary | ICD-10-CM

## 2022-09-02 LAB — BASIC METABOLIC PANEL
Anion gap: 8 (ref 5–15)
BUN: 22 mg/dL (ref 8–23)
CO2: 23 mmol/L (ref 22–32)
Calcium: 9.2 mg/dL (ref 8.9–10.3)
Chloride: 105 mmol/L (ref 98–111)
Creatinine, Ser: 1.19 mg/dL (ref 0.61–1.24)
GFR, Estimated: >60 mL/min (ref >60)
Glucose, Bld: 106 mg/dL — ABNORMAL HIGH (ref 70–99)
Potassium: 4.9 mmol/L (ref 3.5–5.1)
Sodium: 136 mmol/L (ref 135–145)

## 2022-09-02 LAB — CBC
HCT: 49 % (ref 39.0–52.0)
Hemoglobin: 15.7 g/dL (ref 13.0–17.0)
MCH: 30 pg (ref 26.0–34.0)
MCHC: 32 g/dL (ref 30.0–36.0)
MCV: 93.5 fL (ref 80.0–100.0)
Platelets: 249 10*3/uL (ref 150–400)
RBC: 5.24 MIL/uL (ref 4.22–5.81)
RDW: 13.5 % (ref 11.5–15.5)
WBC: 8.3 10*3/uL (ref 4.0–10.5)
nRBC: 0 % (ref 0.0–0.2)

## 2022-09-02 MED ORDER — UMECLIDINIUM-VILANTEROL 62.5-25 MCG/ACT IN AEPB: 1.0000 | INHALATION_SPRAY | Freq: Every day | RESPIRATORY_TRACT | 3 refills | Status: DC

## 2022-09-02 NOTE — Progress Notes (Signed)
Ordered placed for TEE/Cardioversion

## 2022-09-02 NOTE — Patient Instructions (Addendum)
Medication Changes:  START ANORO INHALER- 1 PUFF DAILY   Lab Work:  Labs done today, your results will be available in MyChart, we will contact you for abnormal readings.  Referrals:  YOU HAVE BEEN REFERRED TO CARDIAC REHAB  THEY WILL REACH OUT TO YOU OR CALL TO ARRANGE THIS. PLEASE CALL us WITH ANY CONCERNS    Special Instructions // Education:     Dear Jonathan Garner Jonathan Garner.  You are scheduled for a TEE (Transesophageal Echocardiogram) Guided Cardioversion on Monday, July 29 with Dr. Gasper Lloyd.  Please arrive at the [facility] (Main Entrance A) at [facility]: [address] [place], [place] [address] at 9:00 AM (This time is 1 hour(s) before your procedure to ensure your preparation). Free valet parking service is available. You will check in at ADMITTING. The support person will be asked to wait in the waiting room.  It is OK to have someone drop you off and come back when you are ready to be discharged.     DIET:  Nothing to eat or drink after midnight except a sip of water with medications (see medication instructions below)  MEDICATION INSTRUCTIONS:  !!IF ANY NEW MEDICATIONS ARE STARTED AFTER TODAY, PLEASE NOTIFY YOUR PROVIDER AS SOON AS POSSIBLE!!  FYI: Medications such as Semaglutide (Ozempic, [place]), Tirzepatide (Mounjaro, Zepbound), Dulaglutide (Trulicity), etc ("GLP1 agonists") AND Canagliflozin (Invokana), Dapagliflozin (Farxiga), Empagliflozin (Jardiance), Ertugliflozin (Steglatro), Bexagliflozin [employer]) or any combination with one of these drugs such as Invokamet (Canagliflozin/Metformin), Synjardy (Empagliflozin/Metformin), etc ("SGLT2 inhibitors") must be held around the time of a procedure. This is not a comprehensive list of all of these drugs. Please review all of your medications and talk to your provider if you take any one of these. If you are not sure, ask your provider.         :1}HOLD: Empagliflozin (Jardiance) for 3 day prior to the procedure. Last  dose on Thursday, July 25.   DO NOT TAKE TORSEMIDE, JARDIANCE, OR SPIRONOLACTONE ON THE MORNING OF PROCEDURE         :1}Continue taking your anticoagulant (blood thinner): Apixaban (Eliquis).  You will need to continue this after your procedure until you are told by your provider that it is safe to stop.    LABS: TODAY  FYI:  For your safety, and to allow [place] to monitor your vital signs accurately during the surgery/procedure we request: If you have artificial nails, gel coating, SNS etc, please have those removed prior to your surgery/procedure. Not having the nail coverings /polish removed may result in cancellation or delay of your surgery/procedure.  You must have a responsible person to drive you home and stay in the waiting area during your procedure. Failure to do so could result in cancellation.  Bring your insurance cards.  *Special Note: Every effort is made to have your procedure done on time. Occasionally there are emergencies that occur at the hospital that may cause delays. Please be patient if a delay does occur.   Follow-Up in: 2 MONTHS AS SCHEDULED   At the [facility], you and your health needs are our priority. We have a designated team specialized in the treatment of Heart Failure. This Care Team includes your primary Heart Failure Specialized Cardiologist (physician), Advanced Practice Providers (APPs- Physician Assistants and Nurse Practitioners), and Pharmacist who all work together to provide you with the care you need, when you need it.   You may see any of the following providers on your designated Care  Team at your next follow up:  Dr. Arvilla Meres Dr. Marca Ancona Dr. Dorthula Nettles Tonye Becket, NP Robbie Lis, [place] [facility], [place] Brynda Peon, NP Karle Plumber, PharmD   Please be sure to bring in all your medications bottles to every appointment.   Need to Contact us:  If you have any questions or concerns  before your next appointment please send [place] a message through [place] or call our office at [phone].    TO LEAVE A MESSAGE FOR THE NURSE SELECT OPTION 2, PLEASE LEAVE A MESSAGE INCLUDING: YOUR NAME DATE OF BIRTH CALL BACK NUMBER REASON FOR CALL**this is important as we prioritize the call backs  YOU WILL RECEIVE A CALL BACK THE SAME DAY AS LONG AS YOU CALL BEFORE 4:00 PM

## 2022-09-02 NOTE — Progress Notes (Signed)
ReDS Vest / Clip - 09/02/22 1124       ReDS Vest / Clip   Station Marker D    Ruler Value 33    ReDS Value Range Low volume    ReDS Actual Value 31

## 2022-09-09 DIAGNOSIS — Z299 Encounter for prophylactic measures, unspecified: Secondary | ICD-10-CM | POA: Diagnosis not present

## 2022-09-09 DIAGNOSIS — Z79899 Other long term (current) drug therapy: Secondary | ICD-10-CM | POA: Diagnosis not present

## 2022-09-09 DIAGNOSIS — I10 Essential (primary) hypertension: Secondary | ICD-10-CM | POA: Diagnosis not present

## 2022-09-09 DIAGNOSIS — M25551 Pain in right hip: Secondary | ICD-10-CM | POA: Diagnosis not present

## 2022-09-09 DIAGNOSIS — I25118 Atherosclerotic heart disease of native coronary artery with other forms of angina pectoris: Secondary | ICD-10-CM | POA: Diagnosis not present

## 2022-09-09 DIAGNOSIS — I509 Heart failure, unspecified: Secondary | ICD-10-CM | POA: Diagnosis not present

## 2022-09-09 DIAGNOSIS — I4891 Unspecified atrial fibrillation: Secondary | ICD-10-CM | POA: Diagnosis not present

## 2022-09-10 NOTE — Progress Notes (Signed)
  Patient called with pre-procedure instructions for Monday, July 29 at 8:45AM. NPO after midnight Take morning medications w/sip of water: Take blood thinners and BP medication w/sip of water.  Hold [place], [place]. Ozempic hold for one week.  Confirmed no breaks in taking blood thinner for three weeks Needs a ride home and have a responsible adult to stay w/him or her for 24 hours No lotion day of procedure No jewelry

## 2022-09-10 NOTE — Progress Notes (Signed)
Remote pacemaker transmission.   

## 2022-09-12 ENCOUNTER — Other Ambulatory Visit: Payer: Self-pay | Admitting: Student

## 2022-09-12 DIAGNOSIS — I480 Paroxysmal atrial fibrillation: Secondary | ICD-10-CM

## 2022-09-13 ENCOUNTER — Encounter (HOSPITAL_COMMUNITY)
Admission: RE | Disposition: A | Discharge status: Home / Self Care | Payer: Self-pay | Source: Home / Self Care | Attending: Cardiology

## 2022-09-13 ENCOUNTER — Other Ambulatory Visit: Payer: Self-pay

## 2022-09-13 ENCOUNTER — Ambulatory Visit (HOSPITAL_COMMUNITY): Payer: PPO | Admitting: Anesthesiology

## 2022-09-13 ENCOUNTER — Ambulatory Visit (INDEPENDENT_AMBULATORY_CARE_PROVIDER_SITE_OTHER): Payer: PPO

## 2022-09-13 ENCOUNTER — Ambulatory Visit (HOSPITAL_COMMUNITY)
Admission: RE | Admit: 2022-09-13 | Discharge: 2022-09-13 | Disposition: A | Discharge status: Home / Self Care | Payer: PPO | Attending: Cardiology | Admitting: Cardiology

## 2022-09-13 ENCOUNTER — Ambulatory Visit (HOSPITAL_COMMUNITY): Payer: PPO

## 2022-09-13 ENCOUNTER — Ambulatory Visit (HOSPITAL_BASED_OUTPATIENT_CLINIC_OR_DEPARTMENT_OTHER): Payer: PPO | Admitting: Anesthesiology

## 2022-09-13 DIAGNOSIS — I2510 Atherosclerotic heart disease of native coronary artery without angina pectoris: Secondary | ICD-10-CM | POA: Diagnosis not present

## 2022-09-13 DIAGNOSIS — Z87891 Personal history of nicotine dependence: Secondary | ICD-10-CM | POA: Insufficient documentation

## 2022-09-13 DIAGNOSIS — J449 Chronic obstructive pulmonary disease, unspecified: Secondary | ICD-10-CM | POA: Insufficient documentation

## 2022-09-13 DIAGNOSIS — I442 Atrioventricular block, complete: Secondary | ICD-10-CM | POA: Insufficient documentation

## 2022-09-13 DIAGNOSIS — I509 Heart failure, unspecified: Secondary | ICD-10-CM | POA: Insufficient documentation

## 2022-09-13 DIAGNOSIS — Z7901 Long term (current) use of anticoagulants: Secondary | ICD-10-CM | POA: Diagnosis not present

## 2022-09-13 DIAGNOSIS — I5032 Chronic diastolic (congestive) heart failure: Secondary | ICD-10-CM | POA: Diagnosis not present

## 2022-09-13 DIAGNOSIS — I110 Hypertensive heart disease with heart failure: Secondary | ICD-10-CM | POA: Insufficient documentation

## 2022-09-13 DIAGNOSIS — G4733 Obstructive sleep apnea (adult) (pediatric): Secondary | ICD-10-CM | POA: Insufficient documentation

## 2022-09-13 DIAGNOSIS — I4892 Unspecified atrial flutter: Secondary | ICD-10-CM

## 2022-09-13 DIAGNOSIS — I714 Abdominal aortic aneurysm, without rupture, unspecified: Secondary | ICD-10-CM | POA: Insufficient documentation

## 2022-09-13 DIAGNOSIS — Z950 Presence of cardiac pacemaker: Secondary | ICD-10-CM | POA: Diagnosis not present

## 2022-09-13 DIAGNOSIS — I255 Ischemic cardiomyopathy: Secondary | ICD-10-CM | POA: Diagnosis not present

## 2022-09-13 DIAGNOSIS — E785 Hyperlipidemia, unspecified: Secondary | ICD-10-CM | POA: Diagnosis not present

## 2022-09-13 HISTORY — PX: CARDIOVERSION: SHX1299

## 2022-09-13 LAB — CUP PACEART REMOTE DEVICE CHECK
Battery Remaining Longevity: 131 mo
Battery Voltage: 3.02 V
Brady Statistic RA Percent Paced: 0.02 %
Brady Statistic RV Percent Paced: 99.98 %
Date Time Interrogation Session: 20240729021517
Implantable Lead Connection Status: 753985
Implantable Lead Connection Status: 753985
Implantable Lead Implant Date: 20221014
Implantable Lead Implant Date: 20221014
Implantable Lead Location: 753859
Implantable Lead Location: 753860
Implantable Lead Model: 3830
Implantable Lead Model: 5076
Implantable Pulse Generator Implant Date: 20221014
Lead Channel Impedance Value: 323 Ohm
Lead Channel Impedance Value: 342 Ohm
Lead Channel Impedance Value: 418 Ohm
Lead Channel Impedance Value: 532 Ohm
Lead Channel Pacing Threshold Amplitude: 0.75 V
Lead Channel Pacing Threshold Amplitude: 0.875 V
Lead Channel Pacing Threshold Pulse Width: 0.4 ms
Lead Channel Pacing Threshold Pulse Width: 0.4 ms
Lead Channel Sensing Intrinsic Amplitude: 21.75 mV
Lead Channel Sensing Intrinsic Amplitude: 21.75 mV
Lead Channel Sensing Intrinsic Amplitude: 5 mV
Lead Channel Sensing Intrinsic Amplitude: 5 mV
Lead Channel Setting Pacing Amplitude: 1.5 V
Lead Channel Setting Pacing Amplitude: 2 V
Lead Channel Setting Pacing Pulse Width: 0.4 ms
Lead Channel Setting Sensing Sensitivity: 0.9 mV
Zone Setting Status: 755011
Zone Setting Status: 755011

## 2022-09-13 SURGERY — CARDIOVERSION
Anesthesia: General

## 2022-09-13 MED ORDER — ONDANSETRON HCL 4 MG/2ML IJ SOLN: 4.0000 mg | Freq: Four times a day (QID) | INTRAMUSCULAR | Status: DC | PRN

## 2022-09-13 MED ORDER — LIDOCAINE 2% (20 MG/ML) 5 ML SYRINGE
INTRAMUSCULAR | Status: DC | PRN
  Administered 2022-09-13: 40 mg via INTRAVENOUS

## 2022-09-13 MED ORDER — PROPOFOL 10 MG/ML IV BOLUS
INTRAVENOUS | Status: DC | PRN
  Administered 2022-09-13: 80 mg via INTRAVENOUS

## 2022-09-13 MED ORDER — SODIUM CHLORIDE 0.9 % IV SOLN: INTRAVENOUS | Status: DC

## 2022-09-13 MED ORDER — OXYCODONE HCL 5 MG PO TABS: 5.0000 mg | ORAL_TABLET | Freq: Once | ORAL | Status: DC | PRN

## 2022-09-13 MED ORDER — FENTANYL CITRATE (PF) 100 MCG/2ML IJ SOLN: 25.0000 ug | INTRAMUSCULAR | Status: DC | PRN

## 2022-09-13 MED ORDER — OXYCODONE HCL 5 MG/5ML PO SOLN: 5.0000 mg | Freq: Once | ORAL | Status: DC | PRN

## 2022-09-13 SURGICAL SUPPLY — 1 items: ELECT DEFIB PAD ADLT CADENCE (PAD) ×1 IMPLANT

## 2022-09-13 NOTE — CV Procedure (Signed)
   TRANSESOPHAGEAL ECHOCARDIOGRAM GUIDED DIRECT CURRENT CARDIOVERSION  NAME:  Jonathan Garner.    MRN: [id number] DOB:  12-Dec-1945    ADMIT DATE: 09/13/2022  INDICATIONS: Symptomatic atrial flutter  CARDIOVERSION:     Indications:  Symptomatic Atrial Flutter  Procedure Details:  The patient had the defibrillator pads placed in the anterior and posterior position. Once an appropriate level of sedation was confirmed, the patient was cardioverted x 1 with 200J of biphasic synchronized energy.  The patient converted to NSR.  There were no apparent complications.  The patient had normal neuro status and respiratory status post procedure with vitals stable as recorded elsewhere.  Adequate airway was maintained throughout and vital signs monitored per protocol.  Anuradha Chabot Advanced Heart Failure 10:10 AM
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
## 2022-09-13 NOTE — Anesthesia Postprocedure Evaluation (Signed)
Anesthesia Post Note  Patient: Jonathan Garner.  Procedure(s) Performed: CARDIOVERSION     Patient location during evaluation: Cath Lab Anesthesia Type: General Level of consciousness: awake and alert Pain management: pain level controlled Vital Signs Assessment: post-procedure vital signs reviewed and stable Respiratory status: spontaneous breathing, nonlabored ventilation, respiratory function stable and patient connected to nasal cannula oxygen Cardiovascular status: blood pressure returned to baseline and stable Postop Assessment: no apparent nausea or vomiting Anesthetic complications: no   No notable events documented.  Last Vitals:  Vitals:   09/13/22 1020 09/13/22 1025  BP: 126/75 128/73  Pulse: 84 84  Resp: 14 13  Temp:    SpO2: 96% 95%    Last Pain:  Vitals:   09/13/22 1000  TempSrc: Temporal  PainSc: 0-No pain                 Bryona Foxworthy S

## 2022-09-13 NOTE — Anesthesia Preprocedure Evaluation (Signed)
Anesthesia Evaluation  Patient identified by MRN, date of birth, ID band Patient awake    Reviewed: Allergy & Precautions, H&P , NPO status , Patient's Chart, lab work & pertinent test results  Airway Mallampati: II   Neck ROM: full    Dental   Pulmonary shortness of breath, sleep apnea , COPD, former smoker   breath sounds clear to auscultation       Cardiovascular hypertension, + CAD and +CHF  + dysrhythmias Atrial Fibrillation  Rhythm:irregular Rate:Normal     Neuro/Psych    GI/Hepatic   Endo/Other    Renal/GU      Musculoskeletal  (+) Arthritis ,    Abdominal   Peds  Hematology   Anesthesia Other Findings   Reproductive/Obstetrics                             Anesthesia Physical Anesthesia Plan  ASA: 3  Anesthesia Plan: MAC   Post-op Pain Management:    Induction: Intravenous  PONV Risk Score and Plan: 1 and Propofol infusion and Treatment may vary due to age or medical condition  Airway Management Planned: Nasal Cannula  Additional Equipment:   Intra-op Plan:   Post-operative Plan:   Informed Consent: I have reviewed the patients History and Physical, chart, labs and discussed the procedure including the risks, benefits and alternatives for the proposed anesthesia with the patient or authorized representative who has indicated his/her understanding and acceptance.     Dental advisory given  Plan Discussed with: CRNA, Anesthesiologist and Surgeon  Anesthesia Plan Comments:        Anesthesia Quick Evaluation

## 2022-09-13 NOTE — Transfer of Care (Signed)
Immediate Anesthesia Transfer of Care Note  Patient: Jonathan Garner.  Procedure(s) Performed: CARDIOVERSION  Patient Location: Cath Lab  Anesthesia Type:General  Level of Consciousness: awake, drowsy, and patient cooperative  Airway & Oxygen Therapy: Patient Spontanous Breathing and Patient connected to nasal cannula oxygen  Post-op Assessment: Report given to RN and Post -op Vital signs reviewed and stable  Post vital signs: Reviewed and stable  Last Vitals:  Vitals Value Taken Time  BP 110/74 09/13/22 0957  Temp    Pulse 90 09/13/22 0957  Resp 25 09/13/22 0957  SpO2 98 % 09/13/22 0957  Vitals shown include unfiled device data.  Last Pain:  Vitals:   09/13/22 0843  TempSrc: Temporal         Complications: No notable events documented.

## 2022-09-13 NOTE — H&P (Addendum)
ADVANCED HEART FAILURE Brief pre procedure H&P  Referring Physician: No ref. provider found  Primary Care: Orlene Plum, NP   HPI: Jonathan Garner. is a 77 y.o. 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  He is doing very well today.  Reports taking his anticoagulation.  Baseline LVEF has ranged from 30 to 40%.   Past Medical History:  Diagnosis Date   AAA (abdominal aortic aneurysm) (HCC)    CAD (coronary artery disease)    CHF (congestive heart failure) (HCC)    Diverticulosis    Dyspnea    W/ EXERTION    Gout    Gynecomastia    Hx of emphysema (HCC)    Hyperlipidemia    Hypertension    Lumbar spinal stenosis    OSA (obstructive sleep apnea)    Pedal edema     Current Facility-Administered Medications  Medication Dose Route Frequency Provider Last Rate Last Admin   0.9 %  sodium chloride infusion   Intravenous Continuous Jonathan Grewe, DO        Allergies  Allergen Reactions   Atorvastatin     joint pain       Social History   Socioeconomic History   Marital status: Married    Spouse name: Not on file   Number of children: Not on file   Years of education: Not on file   Highest education level: Not on file  Occupational History   Not on file  Tobacco Use   Smoking status: Former    Current packs/day: 0.00    Average packs/day: 0.3 packs/day for 58.8 years (14.7 ttl pk-yrs)    Types: Cigars, Cigarettes    Start date: 11/15/1961    Quit date: 09/15/2020    Years since quitting: 1.9   Smokeless tobacco: Never  Vaping Use   Vaping status: Never Used  Substance and Sexual Activity   Alcohol use: Yes    Comment: occasionally   Drug use: Never   Sexual activity: Not on file  Other Topics  Concern   Not on file  Social History Narrative   Not on file   Social Determinants of Health   Financial Resource Strain: Not on file  Food Insecurity: No Food Insecurity (01/26/2022)   Hunger Vital Sign    Worried About Running Out of Food in the Last Year: Never true    Ran Out of Food in the Last Year: Never true  Transportation Needs: No Transportation Needs (01/26/2022)   PRAPARE - Administrator, Civil Service (Medical): No    Lack of Transportation (Non-Medical): No  Physical Activity: Not on file  Stress: Not on file  Social Connections: Not on file  Intimate Partner Violence: Not on file      Family History  Problem Relation Age of Onset   Alzheimer's disease Mother    Heart attack Father    Leukemia Father    Hypertension Father    Gout Maternal Uncle    Hypertension Son     PHYSICAL EXAM: Vitals:   09/13/22 0843  BP: 130/70  Pulse: 88  Resp: 15  Temp: 97.6 F (36.4 C)  SpO2: 96%   GENERAL: Well nourished, well developed, and in no apparent distress at rest.  HEENT: Negative for arcus senilis or xanthelasma. There is no scleral  icterus.  The mucous membranes are pink and moist.   NECK: Supple, No masses. Normal carotid upstrokes without bruits. No masses or thyromegaly.    CHEST: There are no chest wall deformities. There is no chest wall tenderness. Respirations are unlabored.  Lungs- CTA B/L CARDIAC:  JVP: 7 cm H2O         Normal S1, S2  Normal rate with regular rhythm. No murmurs, rubs or gallops.  Pulses are 2+ and symmetrical in upper and lower extremities. No edema.  ABDOMEN: Soft, non-tender, non-distended. There are no masses or hepatomegaly. There are normal bowel sounds.  EXTREMITIES: Warm and well perfused with no cyanosis, clubbing.  LYMPHATIC: No axillary or supraclavicular lymphadenopathy.  NEUROLOGIC: Patient is oriented x3 with no focal or lateralizing neurologic deficits.  PSYCH: Patients affect is appropriate, there is no  evidence of anxiety or depression.  SKIN: Warm and dry; no lesions or wounds.   DATA REVIEW  Telemetry:  AFL    ASSESSMENT & PLAN:  Atrial flutter with variable conduction -Reports compliance with anticoagulation.  Rates this morning up to 180. -Proceed with TEE/cardioversion.  Addendum:  Patient reports taking anti-coagulation for 1 month without missing any doses. Anxious about TEE. Risks/benefits of TEE explained to him. At this time will proceed with DCCV only.    Norine Reddington Advanced Heart Failure Mechanical Circulatory Support

## 2022-09-14 ENCOUNTER — Encounter (HOSPITAL_COMMUNITY): Payer: Self-pay | Admitting: Cardiology

## 2022-09-14 ENCOUNTER — Other Ambulatory Visit: Payer: Self-pay | Admitting: *Deleted

## 2022-09-14 DIAGNOSIS — I480 Paroxysmal atrial fibrillation: Secondary | ICD-10-CM

## 2022-09-14 MED ORDER — METOPROLOL SUCCINATE ER 25 MG PO TB24
25.0000 mg | ORAL_TABLET | Freq: Every day | ORAL | 3 refills | Status: DC
Start: 2022-09-14 — End: 2023-09-29

## 2022-09-15 ENCOUNTER — Other Ambulatory Visit (HOSPITAL_COMMUNITY): Payer: Self-pay | Admitting: *Deleted

## 2022-09-15 DIAGNOSIS — I5032 Chronic diastolic (congestive) heart failure: Secondary | ICD-10-CM

## 2022-09-15 DIAGNOSIS — R0609 Other forms of dyspnea: Secondary | ICD-10-CM

## 2022-09-15 DIAGNOSIS — J449 Chronic obstructive pulmonary disease, unspecified: Secondary | ICD-10-CM

## 2022-09-15 DIAGNOSIS — G4733 Obstructive sleep apnea (adult) (pediatric): Secondary | ICD-10-CM

## 2022-09-20 NOTE — Addendum Note (Signed)
Encounter addended by: Dorthula Nettles, DO on: 09/20/2022 1:28 PM  Actions taken: Clinical Note Signed, Level of Service modified, Visit diagnoses modified

## 2022-09-21 ENCOUNTER — Ambulatory Visit: Payer: PPO | Attending: Nurse Practitioner | Admitting: Nurse Practitioner

## 2022-09-21 VITALS — BP 120/72 | HR 73 | Wt 243.4 lb

## 2022-09-21 DIAGNOSIS — E782 Mixed hyperlipidemia: Secondary | ICD-10-CM

## 2022-09-21 DIAGNOSIS — I2510 Atherosclerotic heart disease of native coronary artery without angina pectoris: Secondary | ICD-10-CM

## 2022-09-21 DIAGNOSIS — Z8679 Personal history of other diseases of the circulatory system: Secondary | ICD-10-CM | POA: Diagnosis not present

## 2022-09-21 DIAGNOSIS — J449 Chronic obstructive pulmonary disease, unspecified: Secondary | ICD-10-CM

## 2022-09-21 DIAGNOSIS — I442 Atrioventricular block, complete: Secondary | ICD-10-CM

## 2022-09-21 DIAGNOSIS — G4733 Obstructive sleep apnea (adult) (pediatric): Secondary | ICD-10-CM

## 2022-09-21 DIAGNOSIS — Z79899 Other long term (current) drug therapy: Secondary | ICD-10-CM

## 2022-09-21 DIAGNOSIS — I4891 Unspecified atrial fibrillation: Secondary | ICD-10-CM

## 2022-09-21 DIAGNOSIS — Z950 Presence of cardiac pacemaker: Secondary | ICD-10-CM

## 2022-09-21 DIAGNOSIS — E785 Hyperlipidemia, unspecified: Secondary | ICD-10-CM | POA: Diagnosis not present

## 2022-09-21 DIAGNOSIS — I5020 Unspecified systolic (congestive) heart failure: Secondary | ICD-10-CM

## 2022-09-21 DIAGNOSIS — Z9889 Other specified postprocedural states: Secondary | ICD-10-CM | POA: Diagnosis not present

## 2022-09-21 DIAGNOSIS — I4892 Unspecified atrial flutter: Secondary | ICD-10-CM
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
## 2022-09-21 NOTE — Patient Instructions (Addendum)
Medication Instructions:  Your physician has recommended you make the following change in your medication:  Stop aspirin Continue all other medications the same  Labwork: Your physician recommends that you return for a FASTING lipid/liver profile in 1 month at [facility]. Please do not eat or drink for at least 8 hours when you have this done. You may take your medications that morning with a sip of water.  Testing/Procedures: none  Follow-Up: Your physician recommends that you schedule a follow-up appointment in: 2-3 months  Any Other Special Instructions Will Be Listed Below (If Applicable). Mediterranean Diet information given  If you need a refill on your cardiac medications before your next appointment, please call your pharmacy.

## 2022-09-29 NOTE — Progress Notes (Signed)
Remote pacemaker transmission.   

## 2022-10-12 DIAGNOSIS — I10 Essential (primary) hypertension: Secondary | ICD-10-CM | POA: Diagnosis not present

## 2022-10-12 DIAGNOSIS — I4891 Unspecified atrial fibrillation: Secondary | ICD-10-CM | POA: Diagnosis not present

## 2022-10-12 DIAGNOSIS — Z299 Encounter for prophylactic measures, unspecified: Secondary | ICD-10-CM | POA: Diagnosis not present

## 2022-10-12 DIAGNOSIS — Z79899 Other long term (current) drug therapy: Secondary | ICD-10-CM | POA: Diagnosis not present

## 2022-10-12 DIAGNOSIS — D6869 Other thrombophilia: Secondary | ICD-10-CM | POA: Diagnosis not present

## 2022-10-12 DIAGNOSIS — M25551 Pain in right hip: Secondary | ICD-10-CM | POA: Diagnosis not present

## 2022-10-20 ENCOUNTER — Telehealth: Payer: Self-pay | Admitting: Cardiology

## 2022-10-20 MED ORDER — POTASSIUM CHLORIDE CRYS ER 20 MEQ PO TBCR: 20.0000 meq | EXTENDED_RELEASE_TABLET | Freq: Every day | ORAL | 2 refills | Status: DC

## 2022-10-20 NOTE — Telephone Encounter (Signed)
*  STAT* If patient is at the pharmacy, call can be transferred to refill team.   1. Which medications need to be refilled? (please list name of each medication and dose if known) potassium chloride SA (KLOR-CON M20) 20 MEQ tablet   2. Which pharmacy/location (including street and city if local pharmacy) is medication to be sent to? [facility] [address] [place] - [address]   3. Do they need a 30 day or 90 day supply? 90

## 2022-10-20 NOTE — Telephone Encounter (Signed)
filled

## 2022-10-25 ENCOUNTER — Other Ambulatory Visit: Payer: Self-pay | Admitting: Student

## 2022-10-25 DIAGNOSIS — I480 Paroxysmal atrial fibrillation: Secondary | ICD-10-CM

## 2022-11-04 ENCOUNTER — Ambulatory Visit (HOSPITAL_COMMUNITY)
Admission: RE | Admit: 2022-11-04 | Discharge: 2022-11-04 | Disposition: A | Discharge status: Home / Self Care | Payer: PPO | Source: Ambulatory Visit | Attending: Cardiology | Admitting: Cardiology

## 2022-11-04 ENCOUNTER — Encounter (HOSPITAL_COMMUNITY): Payer: Self-pay | Admitting: Cardiology

## 2022-11-04 VITALS — BP 100/60 | HR 68 | Wt 246.0 lb

## 2022-11-04 DIAGNOSIS — Z7901 Long term (current) use of anticoagulants: Secondary | ICD-10-CM | POA: Diagnosis not present

## 2022-11-04 DIAGNOSIS — Z955 Presence of coronary angioplasty implant and graft: Secondary | ICD-10-CM | POA: Insufficient documentation

## 2022-11-04 DIAGNOSIS — I4891 Unspecified atrial fibrillation: Secondary | ICD-10-CM

## 2022-11-04 DIAGNOSIS — I110 Hypertensive heart disease with heart failure: Secondary | ICD-10-CM | POA: Insufficient documentation

## 2022-11-04 DIAGNOSIS — Z79899 Other long term (current) drug therapy: Secondary | ICD-10-CM | POA: Diagnosis not present

## 2022-11-04 DIAGNOSIS — M1611 Unilateral primary osteoarthritis, right hip: Secondary | ICD-10-CM | POA: Diagnosis not present

## 2022-11-04 DIAGNOSIS — I2510 Atherosclerotic heart disease of native coronary artery without angina pectoris: Secondary | ICD-10-CM | POA: Insufficient documentation

## 2022-11-04 DIAGNOSIS — I5022 Chronic systolic (congestive) heart failure: Secondary | ICD-10-CM | POA: Diagnosis not present

## 2022-11-04 DIAGNOSIS — Z950 Presence of cardiac pacemaker: Secondary | ICD-10-CM | POA: Diagnosis not present

## 2022-11-04 DIAGNOSIS — I4892 Unspecified atrial flutter: Secondary | ICD-10-CM | POA: Insufficient documentation

## 2022-11-04 DIAGNOSIS — I5032 Chronic diastolic (congestive) heart failure: Secondary | ICD-10-CM | POA: Diagnosis not present

## 2022-11-04 DIAGNOSIS — Z7984 Long term (current) use of oral hypoglycemic drugs: Secondary | ICD-10-CM | POA: Diagnosis not present

## 2022-11-04 DIAGNOSIS — G4733 Obstructive sleep apnea (adult) (pediatric): Secondary | ICD-10-CM | POA: Diagnosis not present

## 2022-11-04 LAB — BASIC METABOLIC PANEL
Anion gap: 7 (ref 5–15)
BUN: 26 mg/dL — ABNORMAL HIGH (ref 8–23)
CO2: 25 mmol/L (ref 22–32)
Calcium: 9.2 mg/dL (ref 8.9–10.3)
Chloride: 106 mmol/L (ref 98–111)
Creatinine, Ser: 1.26 mg/dL — ABNORMAL HIGH (ref 0.61–1.24)
GFR, Estimated: 59 mL/min — ABNORMAL LOW (ref >60)
Glucose, Bld: 96 mg/dL (ref 70–99)
Potassium: 4.8 mmol/L (ref 3.5–5.1)
Sodium: 138 mmol/L (ref 135–145)

## 2022-11-04 LAB — BRAIN NATRIURETIC PEPTIDE: B Natriuretic Peptide: 39.9 pg/mL (ref 0.0–100.0)
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
## 2022-11-04 NOTE — Patient Instructions (Signed)
Medication Changes:  No Changes In Medications at this time.   Lab Work:  Labs done today, your results will be available in MyChart, we will contact you for abnormal readings.  Testing/Procedures:  Your physician has requested that you have an echocardiogram IN 4 MONTHS. Echocardiography is a painless test that uses sound waves to create images of your heart. It provides your doctor with information about the size and shape of your heart and how well your heart's chambers and valves are working. This procedure takes approximately one hour. There are no restrictions for this procedure. Please do NOT wear cologne, perfume, aftershave, or lotions (deodorant is allowed). Please arrive 15 minutes prior to your appointment time.  Follow-Up in: 4 MONTHS WITH DR. Gasper Lloyd PLEASE CALL OUR OFFICE AROUND NOVEMBER TO GET SCHEDULED FOR YOUR APPOINTMENT. PHONE NUMBER IS [phone] OPTION 2    At the [facility], you and your health needs are our priority. We have a designated team specialized in the treatment of Heart Failure. This Care Team includes your primary Heart Failure Specialized Cardiologist (physician), Advanced Practice Providers (APPs- Physician Assistants and Nurse Practitioners), and Pharmacist who all work together to provide you with the care you need, when you need it.   You may see any of the following providers on your designated Care Team at your next follow up:  Dr. Arvilla Meres Dr. Marca Ancona Dr. Marcos Eke, NP Robbie Lis, [place] [facility] [place], [place] Brynda Peon, NP Karle Plumber, PharmD   Please be sure to bring in all your medications bottles to every appointment.   Need to Contact us:  If you have any questions or concerns before your next appointment please send [place] a message through [place] or call our office at [phone].    TO LEAVE A MESSAGE FOR THE NURSE SELECT OPTION 2, PLEASE LEAVE A MESSAGE  INCLUDING: YOUR NAME DATE OF BIRTH CALL BACK NUMBER REASON FOR CALL**this is important as we prioritize the call backs  YOU WILL RECEIVE A CALL BACK THE SAME DAY AS LONG AS YOU CALL BEFORE 4:00 PM

## 2022-11-08 DIAGNOSIS — Z0000 Encounter for general adult medical examination without abnormal findings: Secondary | ICD-10-CM | POA: Diagnosis not present

## 2022-11-08 DIAGNOSIS — Z79899 Other long term (current) drug therapy: Secondary | ICD-10-CM | POA: Diagnosis not present

## 2022-11-08 DIAGNOSIS — Z23 Encounter for immunization: Secondary | ICD-10-CM | POA: Diagnosis not present

## 2022-11-08 DIAGNOSIS — Z299 Encounter for prophylactic measures, unspecified: Secondary | ICD-10-CM | POA: Diagnosis not present

## 2022-11-08 DIAGNOSIS — I10 Essential (primary) hypertension: Secondary | ICD-10-CM | POA: Diagnosis not present

## 2022-11-08 DIAGNOSIS — J449 Chronic obstructive pulmonary disease, unspecified: Secondary | ICD-10-CM | POA: Diagnosis not present

## 2022-11-08 DIAGNOSIS — I25118 Atherosclerotic heart disease of native coronary artery with other forms of angina pectoris: Secondary | ICD-10-CM | POA: Diagnosis not present

## 2022-11-08 DIAGNOSIS — R52 Pain, unspecified: Secondary | ICD-10-CM | POA: Diagnosis not present

## 2022-11-21 ENCOUNTER — Other Ambulatory Visit: Payer: Self-pay | Admitting: Cardiology

## 2022-11-22 ENCOUNTER — Other Ambulatory Visit: Payer: Self-pay

## 2022-11-22 DIAGNOSIS — I480 Paroxysmal atrial fibrillation: Secondary | ICD-10-CM

## 2022-11-22 MED ORDER — APIXABAN 5 MG PO TABS
5.0000 mg | ORAL_TABLET | Freq: Two times a day (BID) | ORAL | 5 refills | Status: DC
Start: 2022-11-22 — End: 2023-06-07

## 2022-11-22 NOTE — Telephone Encounter (Signed)
Prescription refill request for Eliquis received. Indication: Aflutter Last office visit: 11/04/22 (Sabharwal)  Scr: 1.26 (11/04/22)  Age: 77 Weight: 111.6kg  Appropriate dose. Refill sent.

## 2022-12-06 ENCOUNTER — Ambulatory Visit (INDEPENDENT_AMBULATORY_CARE_PROVIDER_SITE_OTHER): Payer: PPO

## 2022-12-06 DIAGNOSIS — I442 Atrioventricular block, complete: Secondary | ICD-10-CM | POA: Diagnosis not present

## 2022-12-06 DIAGNOSIS — I4891 Unspecified atrial fibrillation: Secondary | ICD-10-CM

## 2022-12-06 LAB — CUP PACEART REMOTE DEVICE CHECK
Battery Remaining Longevity: 117 mo
Battery Voltage: 3.02 V
Brady Statistic AP VP Percent: 3.11 %
Brady Statistic AP VS Percent: 0 %
Brady Statistic AS VP Percent: 96.86 %
Brady Statistic AS VS Percent: 0.03 %
Brady Statistic RA Percent Paced: 3.09 %
Brady Statistic RV Percent Paced: 99.97 %
Date Time Interrogation Session: 20241020212908
Implantable Lead Connection Status: 753985
Implantable Lead Connection Status: 753985
Implantable Lead Implant Date: 20221014
Implantable Lead Implant Date: 20221014
Implantable Lead Location: 753859
Implantable Lead Location: 753860
Implantable Lead Model: 3830
Implantable Lead Model: 5076
Implantable Pulse Generator Implant Date: 20221014
Lead Channel Impedance Value: 323 Ohm
Lead Channel Impedance Value: 361 Ohm
Lead Channel Impedance Value: 418 Ohm
Lead Channel Impedance Value: 513 Ohm
Lead Channel Pacing Threshold Amplitude: 0.625 V
Lead Channel Pacing Threshold Amplitude: 1.125 V
Lead Channel Pacing Threshold Pulse Width: 0.4 ms
Lead Channel Pacing Threshold Pulse Width: 0.4 ms
Lead Channel Sensing Intrinsic Amplitude: 21.75 mV
Lead Channel Sensing Intrinsic Amplitude: 21.75 mV
Lead Channel Sensing Intrinsic Amplitude: 6.375 mV
Lead Channel Sensing Intrinsic Amplitude: 6.375 mV
Lead Channel Setting Pacing Amplitude: 1.5 V
Lead Channel Setting Pacing Amplitude: 2.25 V
Lead Channel Setting Pacing Pulse Width: 0.4 ms
Lead Channel Setting Sensing Sensitivity: 0.9 mV
Zone Setting Status: 755011
Zone Setting Status: 755011

## 2022-12-14 DIAGNOSIS — R7303 Prediabetes: Secondary | ICD-10-CM | POA: Diagnosis not present

## 2022-12-14 DIAGNOSIS — R52 Pain, unspecified: Secondary | ICD-10-CM | POA: Diagnosis not present

## 2022-12-14 DIAGNOSIS — M25551 Pain in right hip: Secondary | ICD-10-CM | POA: Diagnosis not present

## 2022-12-14 DIAGNOSIS — Z79899 Other long term (current) drug therapy: Secondary | ICD-10-CM | POA: Diagnosis not present

## 2022-12-14 DIAGNOSIS — Z299 Encounter for prophylactic measures, unspecified: Secondary | ICD-10-CM | POA: Diagnosis not present

## 2022-12-14 DIAGNOSIS — I10 Essential (primary) hypertension: Secondary | ICD-10-CM | POA: Diagnosis not present

## 2022-12-14 DIAGNOSIS — R739 Hyperglycemia, unspecified: Secondary | ICD-10-CM | POA: Diagnosis not present

## 2022-12-14 DIAGNOSIS — I509 Heart failure, unspecified: Secondary | ICD-10-CM | POA: Diagnosis not present

## 2022-12-21 NOTE — Progress Notes (Signed)
Remote pacemaker transmission.   

## 2022-12-23 ENCOUNTER — Ambulatory Visit: Payer: PPO | Admitting: Nurse Practitioner

## 2022-12-29 DIAGNOSIS — G4733 Obstructive sleep apnea (adult) (pediatric): Secondary | ICD-10-CM | POA: Diagnosis not present

## 2023-01-03 ENCOUNTER — Other Ambulatory Visit: Payer: Self-pay | Admitting: Cardiology

## 2023-01-06 DIAGNOSIS — M1611 Unilateral primary osteoarthritis, right hip: Secondary | ICD-10-CM | POA: Diagnosis not present

## 2023-01-06 DIAGNOSIS — M25551 Pain in right hip: Secondary | ICD-10-CM | POA: Diagnosis not present

## 2023-01-07 ENCOUNTER — Telehealth: Payer: Self-pay

## 2023-01-07 NOTE — Telephone Encounter (Signed)
   Pre-operative Risk Assessment    Patient Name: Jonathan Garner.  DOB: 1945-02-20 MRN: [id number]     Request for Surgical Clearance    Procedure:  Right hip arthroplasty   Date of Surgery:  Clearance 02/28/23                                 Surgeon:  Dr. Ollen Gross Surgeon's Group or Practice Name:  [facility] Phone number:  [phone] Fax number:  [phone]   Type of Clearance Requested:   - Pharmacy:  Hold Apixaban (Eliquis)     Type of Anesthesia:  Choice    Additional requests/questions:    Scarlette Shorts   01/07/2023, 4:48 PM

## 2023-01-10 ENCOUNTER — Other Ambulatory Visit: Payer: Self-pay | Admitting: Cardiology

## 2023-01-12 DIAGNOSIS — I10 Essential (primary) hypertension: Secondary | ICD-10-CM | POA: Diagnosis not present

## 2023-01-12 DIAGNOSIS — Z299 Encounter for prophylactic measures, unspecified: Secondary | ICD-10-CM | POA: Diagnosis not present

## 2023-01-12 DIAGNOSIS — I4891 Unspecified atrial fibrillation: Secondary | ICD-10-CM | POA: Diagnosis not present

## 2023-01-12 DIAGNOSIS — M25551 Pain in right hip: Secondary | ICD-10-CM | POA: Diagnosis not present

## 2023-01-12 DIAGNOSIS — J449 Chronic obstructive pulmonary disease, unspecified: Secondary | ICD-10-CM | POA: Diagnosis not present

## 2023-01-15 NOTE — Telephone Encounter (Signed)
Patient with diagnosis of atrial fibrillation on Eliquis for anticoagulation.    Procedure:  Right hip arthroplasty    Date of Surgery:  Clearance 02/28/23   CHA2DS2-VASc Score = 5   This indicates a 7.2% annual risk of stroke. The patient's score is based upon: CHF History: 1 HTN History: 1 Diabetes History: 0 Stroke History: 0 Vascular Disease History: 1 Age Score: 2 Gender Score: 0     CrCl 75 Platelet count 249  Per office protocol, patient can hold Eliquis for 3 days prior to procedure.   Patient will not need bridging with Lovenox (enoxaparin) around procedure.  **This guidance is not considered finalized until pre-operative APP has relayed final recommendations.**

## 2023-01-17 NOTE — Telephone Encounter (Signed)
Pt is followed by advanced HF clinic. Last seen by Dr. Gasper Lloyd on 11/04/2022. Please address medical clearance for right hip arthroplasty on 02/28/2023.    Per office protocol, patient can hold Eliquis for 3 days prior to procedure.   Patient will not need bridging with Lovenox (enoxaparin) around procedure.  Rise Paganini, [place], [place]

## 2023-01-20 ENCOUNTER — Telehealth: Payer: Self-pay | Admitting: *Deleted

## 2023-01-20 NOTE — Telephone Encounter (Signed)
   Name: Jonathan Garner.  DOB: Dec 30, 1945  MRN: [id number]  Primary Cardiologist: Dina Rich, MD   Preoperative team, please contact this patient and set up a phone call appointment for further preoperative risk assessment. Please obtain consent and complete medication review. Thank you for your help.  I confirm that guidance regarding antiplatelet and oral anticoagulation therapy has been completed and, if necessary, noted below.  Per office protocol, patient can hold Eliquis for 3 days prior to procedure.   Patient will not need bridging with Lovenox (enoxaparin) around procedure.  Patient was seen by advanced heart failure clinic 9/24.  He was evaluated for upcoming surgery at that time.  Due to time since visit he will need follow-up phone call.  Thank you.  I also confirmed the patient resides in the state of [place]. As per [facility] Medical Board telemedicine laws, the patient must reside in the state in which the provider is licensed.   Ronney Asters, NP 01/20/2023, 9:36 AM [facility]

## 2023-01-20 NOTE — Telephone Encounter (Signed)
I s/w the pt and he has been scheduled tele preop appt 02/07/23. Med rec and consent are done.    Pt asked doesn't he need to hold the Plavix as well as the Eliquis. I answered yes and that I will update the preop APP as well.

## 2023-01-20 NOTE — Telephone Encounter (Signed)
I s/w the pt and he has been scheduled tele preop appt 02/07/23. Med rec and consent are done.   Pt asked doesn't he need to hold the Plavix as well as the Eliquis. I answered yes and that I will update the preop APP as well.     Patient Consent for Virtual Visit        Jonathan Garner. has provided verbal consent on 01/20/2023 for a virtual visit (video or telephone).   CONSENT FOR VIRTUAL VISIT FOR:  Jonathan Garner.  By participating in this virtual visit I agree to the following:  I hereby voluntarily request, consent and authorize [facility] and its employed or contracted physicians, physician assistants, nurse practitioners or other licensed health care professionals (the Practitioner), to provide me with telemedicine health care services (the "Services") as deemed necessary by the treating Practitioner. I acknowledge and consent to receive the Services by the Practitioner via telemedicine. I understand that the telemedicine visit will involve communicating with the Practitioner through live audiovisual communication technology and the disclosure of certain medical information by electronic transmission. I acknowledge that I have been given the opportunity to request an in-person assessment or other available alternative prior to the telemedicine visit and am voluntarily participating in the telemedicine visit.  I understand that I have the right to withhold or withdraw my consent to the use of telemedicine in the course of my care at any time, without affecting my right to future care or treatment, and that the Practitioner or I may terminate the telemedicine visit at any time. I understand that I have the right to inspect all information obtained and/or recorded in the course of the telemedicine visit and may receive copies of available information for a reasonable fee.  I understand that some of the potential risks of receiving the Services via telemedicine include:   Delay or interruption in medical evaluation due to technological equipment failure or disruption; Information transmitted may not be sufficient (e.g. poor resolution of images) to allow for appropriate medical decision making by the Practitioner; and/or  In rare instances, security protocols could fail, causing a breach of personal health information.  Furthermore, I acknowledge that it is my responsibility to provide information about my medical history, conditions and care that is complete and accurate to the best of my ability. I acknowledge that Practitioner's advice, recommendations, and/or decision may be based on factors not within their control, such as incomplete or inaccurate data provided by me or distortions of diagnostic images or specimens that may result from electronic transmissions. I understand that the practice of medicine is not an exact science and that Practitioner makes no warranties or guarantees regarding treatment outcomes. I acknowledge that a copy of this consent can be made available to me via my patient portal [facility] MyChart), or I can request a printed copy by calling the office of [facility].    I understand that my insurance will be billed for this visit.   I have read or had this consent read to me. I understand the contents of this consent, which adequately explains the benefits and risks of the Services being provided via telemedicine.  I have been provided ample opportunity to ask questions regarding this consent and the Services and have had my questions answered to my satisfaction. I give my informed consent for the services to be provided through the use of telemedicine in my medical care

## 2023-02-01 NOTE — H&P (Signed)
TOTAL HIP ADMISSION H&P  Patient is admitted for right total hip arthroplasty.  Subjective:  Chief Complaint: Right hip pain  HPI: Jonathan Garner., 77 y.o. male, has a history of pain and functional disability in the right hip due to arthritis and patient has failed non-surgical conservative treatments for greater than 12 weeks to include NSAID's and/or analgesics, use of assistive devices, and activity modification. Onset of symptoms was gradual, starting  several  years ago with gradually worsening course since that time. The patient noted no past surgery on the right hip. Patient currently rates pain in the right hip at 9 out of 10 with activity. Patient has night pain, worsening of pain with activity and weight bearing, and trendelenberg gait. 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 This condition presents safety issues increasing the risk of falls. There is no current active infection.  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   Cigarette smoker 11/20/2019   OSA on CPAP 11/20/2019   Morbid obesity due to excess calories (HCC) 11/20/2019   DOE (dyspnea on exertion) 11/19/2019   Paresthesia 03/26/2019   Chronic diastolic heart failure (HCC) 03/09/2018   COPD GOLD 0 / still smoking  03/09/2018   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   Hypertension     Past Medical History:  Diagnosis Date   AAA (abdominal aortic aneurysm) (HCC)    CAD (coronary artery disease)    CHF (congestive  heart failure) (HCC)    Diverticulosis    Dyspnea    W/ EXERTION    Gout    Gynecomastia    Hx of emphysema (HCC)    Hyperlipidemia    Hypertension    Lumbar spinal stenosis    OSA (obstructive sleep apnea)    Pedal edema     Past Surgical History:  Procedure Laterality Date   ABDOMINAL AORTIC ANEURYSM REPAIR  2013 and 2018   BACK SURGERY     CARDIAC CATHETERIZATION  04/2016   [place], [place]   CARDIOVERSION N/A 09/13/2022   Procedure: CARDIOVERSION;  Surgeon: Dorthula Nettles, DO;  Location: [facility] INVASIVE [facility];  Service: Cardiovascular;  Laterality: N/A;   ELBOW ARTHROSCOPY Left 2011   I & D EXTREMITY Right 08/23/2020   Procedure: IRRIGATION AND DEBRIDEMENT HAND;  Surgeon: Mack Hook, MD;  [facility];  Service: Orthopedics;  Laterality: Right;  Wound VAC application   I & D EXTREMITY Right 08/26/2020   Procedure: IRRIGATION AND DEBRIDEMENT RIGHT HAND;  Surgeon: Allena Napoleon, MD;  Location: [facility] OR;  Service: Plastics;  Laterality: Right;   I & D EXTREMITY Right 08/29/2020   Procedure: INCISION AND DEBRIDEMENT, HAND;  Surgeon: Allena Napoleon, MD;  Location: [facility] OR;  Service: Plastics;  Laterality: Right;   NECK SURGERY     PACEMAKER IMPLANT N/A 11/28/2020   Procedure: PACEMAKER IMPLANT;  Surgeon: Regan Lemming, MD;  Location: [facility];  Service: Cardiovascular;  Laterality: N/A;   RIGHT/LEFT HEART CATH AND CORONARY ANGIOGRAPHY N/A 08/12/2022  Procedure: RIGHT/LEFT HEART CATH AND CORONARY ANGIOGRAPHY;  Surgeon: Orbie Pyo, MD;  Location: [facility];  Service: Cardiovascular;  Laterality: N/A;   THORACIC AORTIC ENDOVASCULAR STENT GRAFT N/A 10/26/2017   Procedure: THORACIC AORTIC ENDOVASCULAR STENT GRAFT WITH ILIAC EXTENSIONS;  Surgeon: Larina Earthly, MD;  Location: [facility] OR;  Service: Vascular;  Laterality: N/A;  PATIENT WILL NEED SPINAL CORD DRAIN BY ANESTHESIA   VASECTOMY  1976    Prior to Admission medications   Medication Sig Start Date End Date  Taking? Authorizing Provider  acetaminophen (TYLENOL) 500 MG tablet Take 500 mg by mouth every 8 (eight) hours as needed for moderate pain.    [provider]  apixaban (ELIQUIS) 5 MG TABS tablet Take 1 tablet (5 mg total) by mouth 2 (two) times daily. 11/22/22   Sabharwal, Eliezer Lofts, DO  clopidogrel (PLAVIX) 75 MG tablet Take 1 tablet by mouth once daily 01/03/23   Antoine Poche, MD  Cyanocobalamin ([employer] VITAMIN B-12) 5000 MCG SUBL Place 5,000 mcg under the tongue daily.    [provider]  ENTRESTO 24-26 MG Take 1 tablet by mouth twice daily 01/10/23   Antoine Poche, MD  HYDROcodone-acetaminophen (NORCO/VICODIN) 5-325 MG tablet Take 1 tablet by mouth 3 (three) times daily as needed for moderate pain. 08/27/22   [provider]  JARDIANCE 10 MG TABS tablet Take 1 tablet by mouth once daily 03/25/22   Antoine Poche, MD  metoprolol succinate (TOPROL XL) 25 MG 24 hr tablet Take 1 tablet (25 mg total) by mouth at bedtime. 09/14/22   Graciella Freer, PA-C  nitroGLYCERIN (NITROSTAT) 0.4 MG SL tablet Place 1 tablet (0.4 mg total) under the tongue every 5 (five) minutes x 3 doses as needed for chest pain. 11/29/20   Strader, Lennart Pall, PA-C  potassium chloride SA (KLOR-CON M20) 20 MEQ tablet Take 1 tablet (20 mEq total) by mouth daily. 10/20/22   Antoine Poche, MD  spironolactone (ALDACTONE) 25 MG tablet Take 1/2 (one-half) tablet by mouth once daily 11/22/22   Antoine Poche, MD  torsemide [facility]) 20 MG tablet Take 1 tablet by mouth once daily 03/16/22   Antoine Poche, MD  umeclidinium-vilanterol (ANORO ELLIPTA) 62.5-25 MCG/ACT AEPB Inhale 1 puff into the lungs daily. Patient not taking: Reported on 01/20/2023 09/02/22   Dorthula Nettles, DO    Allergies  Allergen Reactions   Atorvastatin     joint pain     Social History   Socioeconomic History   Marital status: Married    Spouse name: Not on file   Number of children: Not on file   Years  of education: Not on file   Highest education level: Not on file  Occupational History   Not on file  Tobacco Use   Smoking status: Former    Current packs/day: 0.00    Average packs/day: 0.3 packs/day for 58.8 years (14.7 ttl pk-yrs)    Types: Cigars, Cigarettes    Start date: 11/15/1961    Quit date: 09/15/2020    Years since quitting: 2.3   Smokeless tobacco: Never  Vaping Use   Vaping status: Never Used  Substance and Sexual Activity   Alcohol use: Yes    Comment: occasionally   Drug use: Never   Sexual activity: Not on file  Other Topics Concern   Not on file  Social History Narrative   Not on file   Social Drivers of Health   Financial Resource Strain: Not on  file  Food Insecurity: No Food Insecurity (01/26/2022)   Hunger Vital Sign    Worried About Running Out of Food in the Last Year: Never true    Ran Out of Food in the Last Year: Never true  Transportation Needs: No Transportation Needs (01/26/2022)   PRAPARE - Administrator, Civil Service (Medical): No    Lack of Transportation (Non-Medical): No  Physical Activity: Not on file  Stress: Not on file  Social Connections: Not on file  Intimate Partner Violence: Not on file    Tobacco Use: Medium Risk (11/04/2022)   Patient History    Smoking Tobacco Use: Former    Smokeless Tobacco Use: Never    Passive Exposure: Not on file   Social History   Substance and Sexual Activity  Alcohol Use Yes   Comment: occasionally    Family History  Problem Relation Age of Onset   Alzheimer's disease Mother    Heart attack Father    Leukemia Father    Hypertension Father    Gout Maternal Uncle    Hypertension Son     ROS   Objective:  Physical Exam: Well nourished and well developed.  General: Alert and oriented x3, cooperative and pleasant, no acute distress.  Head: normocephalic, atraumatic, neck supple.  Eyes: EOMI. Abdomen: non-tender to palpation and soft, normoactive bowel  sounds. Musculoskeletal: - Left hip can be flexed to 110 degrees, rotated internally to 30 degrees, externally to 40 degrees, and abducted to 40 degrees without pain on range of motion.  - Right hip can be flexed to 90 degrees, rotated approximately 10 degrees in each direction, and abducted to 20 degrees with pain on range of motion.  - Gait pattern is significantly antalgic on the right. Calves soft and nontender. Motor function intact in LE. Strength 5/5 LE bilaterally. Neuro: Distal pulses 2+. Sensation to light touch intact in LE.  Vital signs in last 24 hours: BP: ()/()  Arterial Line BP: ()/()   Imaging Review Plain radiographs demonstrate severe degenerative joint disease of the right hip. The bone quality appears to be adequate for age and reported activity level.  Assessment/Plan:  End stage arthritis, right hip  The patient history, physical examination, clinical judgement of the provider and imaging studies are consistent with end stage degenerative joint disease of the right hip and total hip arthroplasty is deemed medically necessary. The treatment options including medical management, injection therapy, arthroscopy and arthroplasty were discussed at length. The risks and benefits of total hip arthroplasty were presented and reviewed. The risks due to aseptic loosening, infection, stiffness, dislocation/subluxation, thromboembolic complications and other imponderables were discussed. The patient acknowledged the explanation, agreed to proceed with the plan and consent was signed. Patient is being admitted for inpatient treatment for surgery, pain control, PT, OT, prophylactic antibiotics, VTE prophylaxis, progressive ambulation and ADLs and discharge planning.The patient is planning to be discharged  home .  Therapy Plans: HEP Disposition: Home with Wife Planned DVT Prophylaxis: Plavix + Eliquis DME Needed: RW PCP: Max Sane, DNP (requesting cardiac  clearance) Cardiologist: Dina Rich, MD (appt 12/23) TXA: IV Allergies: NKDA Anesthesia Concerns: see below. BMI: 38.5 Last HgbA1c: not diabetic  Pharmacy: [facility])  Other: -Hx of pacemaker, a fib, and CHF -Currently taking hydrocodone 7.5 mg BID - discussed oxy post-op  - Patient was instructed on what medications to stop prior to surgery. - Follow-up visit in 2 weeks with Dr. Lequita Halt - Begin physical therapy following surgery - Pre-operative  lab work as pre-surgical testing - Prescriptions will be provided in hospital at time of discharge  R. [place]a Jansky, PA-C Orthopedic Surgery [facility] [place]

## 2023-02-04 ENCOUNTER — Other Ambulatory Visit: Payer: Self-pay | Admitting: Cardiology

## 2023-02-07 ENCOUNTER — Ambulatory Visit: Payer: PPO | Attending: Cardiology | Admitting: General Practice

## 2023-02-07 DIAGNOSIS — Z01810 Encounter for preprocedural cardiovascular examination: Secondary | ICD-10-CM | POA: Diagnosis not present

## 2023-02-07 NOTE — Progress Notes (Signed)
Virtual Visit via Telephone Note   Because of Jonathan EHLY Jr.'s co-morbid illnesses, he is at least at moderate risk for complications without adequate follow up.  This format is felt to be most appropriate for this patient at this time.  The patient did not have access to video technology/had technical difficulties with video requiring transitioning to audio format only (telephone).  All issues noted in this document were discussed and addressed.  No physical exam could be performed with this format.  Please refer to the patient's chart for his consent to telehealth for Jonathan Garner.  Evaluation Performed:  Preoperative cardiovascular risk assessment _____________   Date:  02/07/2023   Patient ID:  Jonathan Garner., DOB 09-18-1945, MRN [id number] Patient Location:  Home Provider location:   Office  Primary Care Provider:  Orlene Plum, NP Primary Cardiologist:  Dina Rich, MD  Chief Complaint / Patient Profile   77 y.o. y/o male with a h/o systolic heart failure atrial flutter, obstructive lung disease, coronary artery disease who is pending right total hip arthroplasty and presents today for telephonic preoperative cardiovascular risk assessment.  History of Present Illness    Jonathan Maidonado. is a 77 y.o. male who presents via audio/video conferencing for a telehealth visit today.  Pt was last seen in cardiology clinic on 11/04/2022 by  Dr. Gasper Lloyd.  At that time Jonathan Garner. was doing well .  The patient is now pending procedure as outlined above. Since his last visit, he remains stable form a cardiac standpoint.   Today denies chest pain, shortness of breath, lower extremity edema, fatigue, palpitations, melena, hematuria, hemoptysis, diaphoresis, weakness, presyncope, syncope, orthopnea, and PND.   Past Medical History    Past Medical History:  Diagnosis Date   AAA (abdominal aortic aneurysm) (HCC)    CAD (coronary artery  disease)    CHF (congestive heart failure) (HCC)    Diverticulosis    Dyspnea    W/ EXERTION    Gout    Gynecomastia    Hx of emphysema (HCC)    Hyperlipidemia    Hypertension    Lumbar spinal stenosis    OSA (obstructive sleep apnea)    Pedal edema    Past Surgical History:  Procedure Laterality Date   ABDOMINAL AORTIC ANEURYSM REPAIR  2013 and 2018   BACK SURGERY     CARDIAC CATHETERIZATION  04/2016   [place], [place]   CARDIOVERSION N/A 09/13/2022   Procedure: CARDIOVERSION;  Surgeon: Dorthula Nettles, DO;  Location: [facility];  Service: Cardiovascular;  Laterality: N/A;   ELBOW ARTHROSCOPY Left 2011   I & D EXTREMITY Right 08/23/2020   Procedure: IRRIGATION AND DEBRIDEMENT HAND;  Surgeon: Mack Hook, MD;  Location: [facility] OR;  Service: Orthopedics;  Laterality: Right;  Wound VAC application   I & D EXTREMITY Right 08/26/2020   Procedure: IRRIGATION AND DEBRIDEMENT RIGHT HAND;  Surgeon: Allena Napoleon, MD;  Location: [facility] OR;  Service: Plastics;  Laterality: Right;   I & D EXTREMITY Right 08/29/2020   Procedure: INCISION AND DEBRIDEMENT, HAND;  Surgeon: Allena Napoleon, MD;  Location: [facility] OR;  Service: Plastics;  Laterality: Right;   NECK SURGERY     PACEMAKER IMPLANT N/A 11/28/2020   Procedure: PACEMAKER IMPLANT;  Surgeon: Regan Lemming, MD;  Location: [facility];  Service: Cardiovascular;  Laterality: N/A;   RIGHT/LEFT HEART CATH AND CORONARY ANGIOGRAPHY N/A 08/12/2022   Procedure: RIGHT/LEFT HEART CATH  AND CORONARY ANGIOGRAPHY;  Surgeon: Orbie Pyo, MD;  [facility] INVASIVE CV LAB;  Service: Cardiovascular;  Laterality: N/A;   THORACIC AORTIC ENDOVASCULAR STENT GRAFT N/A 10/26/2017   Procedure: THORACIC AORTIC ENDOVASCULAR STENT GRAFT WITH ILIAC EXTENSIONS;  Surgeon: Larina Earthly, MD;  Location: [facility] OR;  Service: Vascular;  Laterality: N/A;  PATIENT WILL NEED SPINAL CORD DRAIN BY ANESTHESIA   VASECTOMY  1976    Allergies  Allergies  Allergen Reactions    Atorvastatin     joint pain     Home Medications    Prior to Admission medications   Medication Sig Start Date End Date Taking? Authorizing Provider  acetaminophen (TYLENOL) 500 MG tablet Take 500 mg by mouth every 8 (eight) hours as needed for moderate pain.    [provider]  apixaban (ELIQUIS) 5 MG TABS tablet Take 1 tablet (5 mg total) by mouth 2 (two) times daily. 11/22/22   Sabharwal, Eliezer Lofts, DO  clopidogrel (PLAVIX) 75 MG tablet Take 1 tablet by mouth once daily 01/03/23   Antoine Poche, MD  Cyanocobalamin ([employer] VITAMIN B-12) 5000 MCG SUBL Place 5,000 mcg under the tongue daily.    [provider]  ENTRESTO 24-26 MG Take 1 tablet by mouth twice daily 01/10/23   Antoine Poche, MD  HYDROcodone-acetaminophen (NORCO/VICODIN) 5-325 MG tablet Take 1 tablet by mouth 3 (three) times daily as needed for moderate pain. 08/27/22   [provider]  JARDIANCE 10 MG TABS tablet Take 1 tablet by mouth once daily 03/25/22   Antoine Poche, MD  metoprolol succinate (TOPROL XL) 25 MG 24 hr tablet Take 1 tablet (25 mg total) by mouth at bedtime. 09/14/22   Graciella Freer, PA-C  nitroGLYCERIN (NITROSTAT) 0.4 MG SL tablet Place 1 tablet (0.4 mg total) under the tongue every 5 (five) minutes x 3 doses as needed for chest pain. 11/29/20   Strader, Lennart Pall, PA-C  potassium chloride SA (KLOR-CON M20) 20 MEQ tablet Take 1 tablet (20 mEq total) by mouth daily. 10/20/22   Antoine Poche, MD  spironolactone (ALDACTONE) 25 MG tablet Take 1/2 (one-half) tablet by mouth once daily 11/22/22   Antoine Poche, MD  torsemide [facility]) 20 MG tablet Take 1 tablet by mouth once daily 02/04/23   Antoine Poche, MD  umeclidinium-vilanterol (ANORO ELLIPTA) 62.5-25 MCG/ACT AEPB Inhale 1 puff into the lungs daily. Patient not taking: Reported on 01/20/2023 09/02/22   Dorthula Nettles, DO    Physical Exam    Vital Signs:  Jonathan Garner Edythe Clarity. does not have vital signs  available for review today.  Given telephonic nature of communication, physical exam is limited. AAOx3. NAD. Normal affect.  Speech and respirations are unlabored.  Accessory Clinical Findings    None  Assessment & Plan    1.  Preoperative Cardiovascular Risk Assessment:Right hip arthroplasty,  02/28/23 , Surgeon:  Dr. Ollen Gross Surgeon's Group or Practice Name:  [facility] Fax number:  [phone]       Primary Cardiologist: Dina Rich, MD  Chart reviewed as part of pre-operative protocol coverage. Given past medical history and time since last visit, based on ACC/AHA guidelines, Kaymon Licea. would be at acceptable risk for the planned procedure without further cardiovascular testing.   His RCRI is high risk, greater than 11% risk of major cardiac event.  He is able to complete greater than 4 METS of physical activity.  Patient was advised that if he develops new symptoms  prior to surgery to contact our office to arrange a follow-up appointment.  He verbalized understanding.  CHA2DS2-VASc Score = 5   This indicates a 7.2% annual risk of stroke. The patient's score is based upon: CHF History: 1 HTN History: 1 Diabetes History: 0 Stroke History: 0 Vascular Disease History: 1 Age Score: 2 Gender Score: 0      CrCl 75 Platelet count 249   Per office protocol, patient can hold Eliquis for 3 days prior to procedure.   Patient will not need bridging with Lovenox (enoxaparin) around procedure.  His Plavix may be held for 5 days prior to his procedure. Please resume as soon as hemostasis is achieved.  Patient's Jardiance should be held 3 days prior to his surgery.  Please resume when safe postoperatively.  I will route this recommendation to the requesting party via Epic fax function and remove from pre-op pool.       Time:   Today, I have spent 6 minutes with the patient with telehealth technology discussing medical history, symptoms, and  management plan.     Ronney Asters, NP  02/07/2023, 7:02 AM    Prior to patient's phone evaluation I spent greater than 10 minutes reviewing their past medical history and cardiac medications.

## 2023-02-16 ENCOUNTER — Encounter: Payer: Self-pay | Admitting: Cardiology

## 2023-02-16 NOTE — Progress Notes (Addendum)
 COVID Vaccine received:  []  No [x]  Yes Date of any COVID positive Test in last 90 days: none  PCP - Jacquline Eastern, NP at [facility]  Phone: [phone]   Fax: [phone]  Cardiologist - Dorn Ross, MD,  Josefa Beauvais, NP 02-07-2023  Epic note cardiac clearance.   EP- Soyla Norton, MD   Chest x-ray - 11-29-2020  2v   Epic EKG - 09-13-2022  Epic  Stress Test -  ECHO - 07-22-2022  Epic Cardiac Cath - 08-02-22  [facility] by Dr. Lurena Red Cardioversion-  09-13-2022  Dr. Ria Commander  PCR screen: [x]  Ordered & Completed []   No Order but Needs PROFEND     []   N/A for this surgery  Surgery Plan:  []  Ambulatory   [x]  Outpatient in bed  []  Admit Anesthesia:    []  General  []  Spinal  [x]   Choice []   MAC  Pacemaker / ICD device []  No [x]  Yes  11-28-2020 Medtronic [id number] Azure XT DR MRI  - Device orders requested from [facility] Spinal Cord Stimulator:[x]  No []  Yes       History of Sleep Apnea? []  No [x]  Yes   CPAP used?- []  No [x]  Yes    Does the patient monitor blood sugar?   []  N/A   [x]  No []  Yes  Patient has: []  NO Hx DM   [x]  Pre-DM   []  DM1  []   DM2 Last A1c was:  5.9 on 12-14-2022     Does patient have a [employer] or Dexacom? [x]  No []  Yes   Fasting Blood Sugar Ranges-  Checks Blood Sugar _0_ times a day  JARDIANCE -  Hold x 72 hours  Last Dose will be taken on Thursday 02-24-2023    Blood Thinner / Instructions: Eliquis  - Hold x 3 days Patient is aware   Plavix   Hold x 5 days     per J. Cleaver, NP Aspirin  Instructions:  none  ERAS Protocol Ordered: []  No  [x]  Yes PRE-SURGERY []  ENSURE  [x]  G2  Patient is to be NPO after: 1000  Dental hx: [x]  Dentures: full set of dentures [x]  N/A      []  Bridge or Partial:                   []  Loose or Damaged teeth:   Comments: Patient was given the 5 CHG shower / bath instructions for THA  surgery along with 2 bottles of the CHG soap. Patient will start this on:  Thursday 02-24-2023 All questions were asked and answered, Patient  voiced understanding of this process.   Activity level: Patient is unable to climb a flight of stairs without difficulty; [x]  No CP  but would have SOB and hip pain. Patient can perform ADLs without assistance.   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  Patient verbalized understanding and agreement to the Pre-Surgical Instructions that were given to them at this PAT appointment. Patient was also educated of the need to review these PAT instructions again prior to his surgery.I reviewed the appropriate phone numbers to call if they have any and questions or concerns.

## 2023-02-16 NOTE — Patient Instructions (Signed)
 SURGICAL WAITING ROOM VISITATION Patients having surgery or a procedure may have no more than 2 support people in the waiting area - these visitors may rotate in the visitor waiting room.   Due to an increase in RSV and influenza rates and associated hospitalizations, children ages 8 and under may not visit patients in [facility] hospitals. If the patient needs to stay at the hospital during part of their recovery, the visitor guidelines for inpatient rooms apply.  PRE-OP VISITATION  Pre-op nurse will coordinate an appropriate time for 1 support person to accompany the patient in pre-op.  This support person may not rotate.  This visitor will be contacted when the time is appropriate for the visitor to come back in the pre-op area.  Please refer to the [facility] website for the visitor guidelines for Inpatients (after your surgery is over and you are in a regular room).  You are not required to quarantine at this time prior to your surgery. However, you must do this: Hand Hygiene often Do NOT share personal items Notify your provider if you are in close contact with someone who has COVID or you develop fever 100.4 or greater, new onset of sneezing, cough, sore throat, shortness of breath or body aches.  If you test positive for Covid or have been in contact with anyone that has tested positive in the last 10 days please notify you surgeon.    Your procedure is scheduled on:  Monday  February 28, 2023  Report to [facility] Main Entrance: [address] entrance where the [employer] is available.   Report to admitting at:  10:30   AM  Call this number if you have any questions or problems the morning of surgery [phone]  Do not eat food after Midnight the night prior to your surgery/procedure.  After Midnight you may have the following liquids until  10:00 AM DAY OF SURGERY  Clear Liquid Diet Water Black Coffee (sugar ok, NO MILK/CREAM OR CREAMERS)  Tea (sugar ok, NO  MILK/CREAM OR CREAMERS) regular and decaf                             Plain Jell-O  with no fruit (NO RED)                                           Fruit ices (not with fruit pulp, NO RED)                                     Popsicles (NO RED)                                                                  Juice: NO CITRUS JUICES: only apple, WHITE grape, WHITE cranberry Sports drinks like Gatorade or Powerade (NO RED)                    The day of surgery:  Drink ONE (1) Pre-Surgery G2 at  10:00 AM the morning of surgery. Drink  in one sitting. Do not sip.  This drink was given to you during your hospital pre-op appointment visit. Nothing else to drink after completing the Pre-Surgery  G2 : No candy, chewing gum or throat lozenges.    FOLLOW ANY ADDITIONAL PRE OP INSTRUCTIONS YOU RECEIVED FROM YOUR SURGEON'S OFFICE!!!   Oral Hygiene is also important to reduce your risk of infection.        Remember - BRUSH YOUR TEETH THE MORNING OF SURGERY WITH YOUR REGULAR TOOTHPASTE  Do NOT smoke after Midnight the night before surgery.  JARDIANCE -  Stop taking 72 hours before surgery.  Last Dose will be taken on Thursday 02-24-2023    Eliquis  -Stop taking 3 days before surgery. Last dose will be taken on Thursday 02-24-2023   Plavix   stop taking 5 days before surgery. Last dose will be taken on Tuesday  02-22-2023   STOP TAKING all Vitamins, Herbs and supplements 1 week before your surgery.   Take ONLY these medicines the morning of surgery with A SIP OF WATER: You may take EITHER TYLENOL  OR HYDROCODONE APAP if needed depending on pain level.  If You have been diagnosed with Sleep Apnea - Bring CPAP mask and tubing day of surgery. We will provide you with a CPAP machine on the day of your surgery.                   You may not have any metal on your body including  jewelry, and body piercing  Do not wear  lotions, powders, cologne, or deodorant  Men may shave face and neck.  Contacts,  Hearing Aids, dentures or bridgework may not be worn into surgery. DENTURES WILL BE REMOVED PRIOR TO SURGERY PLEASE DO NOT APPLY Poly grip OR ADHESIVES!!!  You may bring a small overnight bag with you on the day of surgery, only pack items that are not valuable. [facility] IS NOT RESPONSIBLE   FOR VALUABLES THAT ARE LOST OR STOLEN.   Do not bring your home medications to the hospital. The Pharmacy will dispense medications listed on your medication list to you during your admission in the Hospital.  Special Instructions: Bring a copy of your healthcare power of attorney and living will documents the day of surgery, if you wish to have them scanned into your [facility] Records- EPIC  Please read over the following fact sheets you were given: IF YOU HAVE QUESTIONS ABOUT YOUR PRE-OP INSTRUCTIONS, PLEASE CALL [phone].     Pre-operative 5 CHG Bath Instructions   You can play a key role in reducing the risk of infection after surgery. Your skin needs to be as free of germs as possible. You can reduce the number of germs on your skin by washing with CHG (chlorhexidine  gluconate) soap before surgery. CHG is an antiseptic soap that kills germs and continues to kill germs even after washing.   DO NOT use if you have an allergy to chlorhexidine /CHG or antibacterial soaps. If your skin becomes reddened or irritated, stop using the CHG and notify one of our RNs at [phone]  Please shower with the CHG soap starting 4 days before surgery using the following schedule: START SHOWERS ON  Thursday  February 24, 2023  Please keep in mind the following:  DO NOT shave, including legs and underarms, starting the day of your first shower.   You may shave your face at any point before/day of surgery.   Place clean sheets on  your bed the day you start using CHG soap. Use a clean washcloth (not used since being washed) for each shower. DO NOT sleep with pets once you start using the CHG.   CHG Shower Instructions:  If you choose to wash your hair and private area, wash first with your normal shampoo/soap.  After you use shampoo/soap, rinse your hair and body thoroughly to remove shampoo/soap residue.  Turn the water OFF and apply about 3 tablespoons (45 ml) of CHG soap to a CLEAN washcloth.  Apply CHG soap ONLY FROM YOUR NECK DOWN TO YOUR TOES (washing for 3-5 minutes)  DO NOT use CHG soap on face, private areas, open wounds, or sores.  Pay special attention to the area where your surgery is being performed.  If you are having back surgery, having someone wash your back for you may be helpful.  Wait 2 minutes after CHG soap is applied, then you may rinse off the CHG soap.  Pat dry with a clean towel  Put on clean clothes/pajamas   If you choose to wear lotion, please use ONLY the CHG-compatible lotions on the back of this paper.     Additional instructions for the day of surgery: DO NOT APPLY any lotions, deodorants, cologne, or perfumes.   Put on clean/comfortable clothes.  Brush your teeth.  Ask your nurse before applying any prescription medications to the skin.      CHG Compatible Lotions   Aveeno Moisturizing lotion  Cetaphil Moisturizing Cream  Cetaphil Moisturizing Lotion  Clairol Herbal Essence Moisturizing Lotion, Dry Skin  Clairol Herbal Essence Moisturizing Lotion, Extra Dry Skin  Clairol Herbal Essence Moisturizing Lotion, Normal Skin  Curel Age Defying Therapeutic Moisturizing Lotion with Alpha Hydroxy  Curel Extreme Care Body Lotion  Curel Soothing Hands Moisturizing Hand Lotion  Curel Therapeutic Moisturizing Cream, Fragrance-Free  Curel Therapeutic Moisturizing Lotion, Fragrance-Free  Curel Therapeutic Moisturizing Lotion, Original Formula  Eucerin Daily Replenishing Lotion   Eucerin Dry Skin Therapy Plus Alpha Hydroxy Crme  Eucerin Dry Skin Therapy Plus Alpha Hydroxy Lotion  Eucerin Original Crme  Eucerin Original Lotion  Eucerin Plus Crme Eucerin Plus Lotion  Eucerin TriLipid Replenishing Lotion  Keri Anti-Bacterial Hand Lotion  Keri Deep Conditioning Original Lotion Dry Skin Formula Softly Scented  Keri Deep Conditioning Original Lotion, Fragrance Free Sensitive Skin Formula  Keri Lotion Fast Absorbing Fragrance Free Sensitive Skin Formula  Keri Lotion Fast Absorbing Softly Scented Dry Skin Formula  Keri Original Lotion  Keri Skin Renewal Lotion Keri Silky Smooth Lotion  Keri Silky Smooth Sensitive Skin Lotion  Nivea Body Creamy Conditioning Oil  Nivea Body Extra Enriched Lotion  Nivea Body Original Lotion  Nivea Body Sheer Moisturizing Lotion Nivea Crme  Nivea Skin Firming Lotion  NutraDerm 30 Skin Lotion  NutraDerm Skin Lotion  NutraDerm Therapeutic Skin Cream  NutraDerm Therapeutic Skin Lotion  ProShield Protective Hand Cream  Provon moisturizing lotion   FAILURE TO FOLLOW THESE INSTRUCTIONS MAY RESULT IN THE CANCELLATION OF YOUR SURGERY  PATIENT SIGNATURE_________________________________  NURSE SIGNATURE__________________________________  ________________________________________________________________________      Jonathan Garner    An incentive spirometer is a tool that can help keep your lungs clear and active. This tool measures how well you are filling your lungs with each breath. Taking long  deep breaths may help reverse or decrease the chance of developing breathing (pulmonary) problems (especially infection) following: A long period of time when you are unable to move or be active. BEFORE THE PROCEDURE  If the spirometer includes an indicator to show your best effort, your nurse or respiratory therapist will set it to a desired goal. If possible, sit up straight or lean slightly forward. Try not to slouch. Hold the  incentive spirometer in an upright position. INSTRUCTIONS FOR USE  Sit on the edge of your bed if possible, or sit up as far as you can in bed or on a chair. Hold the incentive spirometer in an upright position. Breathe out normally. Place the mouthpiece in your mouth and seal your lips tightly around it. Breathe in slowly and as deeply as possible, raising the piston or the ball toward the top of the column. Hold your breath for 3-5 seconds or for as long as possible. Allow the piston or ball to fall to the bottom of the column. Remove the mouthpiece from your mouth and breathe out normally. Rest for a few seconds and repeat Steps 1 through 7 at least 10 times every 1-2 hours when you are awake. Take your time and take a few normal breaths between deep breaths. The spirometer may include an indicator to show your best effort. Use the indicator as a goal to work toward during each repetition. After each set of 10 deep breaths, practice coughing to be sure your lungs are clear. If you have an incision (the cut made at the time of surgery), support your incision when coughing by placing a pillow or rolled up towels firmly against it. Once you are able to get out of bed, walk around indoors and cough well. You may stop using the incentive spirometer when instructed by your caregiver.  RISKS AND COMPLICATIONS Take your time so you do not get dizzy or light-headed. If you are in pain, you may need to take or ask for pain medication before doing incentive spirometry. It is harder to take a deep breath if you are having pain. AFTER USE Rest and breathe slowly and easily. It can be helpful to keep track of a log of your progress. Your caregiver can provide you with a simple table to help with this. If you are using the spirometer at home, follow these instructions: SEEK MEDICAL CARE IF:  You are having difficultly using the spirometer. You have trouble using the spirometer as often as instructed. Your  pain medication is not giving enough relief while using the spirometer. You develop fever of 100.5 F (38.1 C) or higher.                                                                                                    SEEK IMMEDIATE MEDICAL CARE IF:  You cough up bloody sputum that had not been present before. You develop fever of 102 F (38.9 C) or greater. You develop worsening pain at or near the incision site. MAKE SURE YOU:  Understand these instructions. Will watch your condition. Will  get help right away if you are not doing well or get worse. Document Released: 06/14/2006 Document Revised: 04/26/2011 Document Reviewed: 08/15/2006 [facility] Patient Information 2014 [place], [place].     WHAT IS A BLOOD TRANSFUSION? Blood Transfusion Information  A transfusion is the replacement of blood or some of its parts. Blood is made up of multiple cells which provide different functions. Red blood cells carry oxygen and are used for blood loss replacement. White blood cells fight against infection. Platelets control bleeding. Plasma helps clot blood. Other blood products are available for specialized needs, such as hemophilia or other clotting disorders. BEFORE THE TRANSFUSION  Who gives blood for transfusions?  Healthy volunteers who are fully evaluated to make sure their blood is safe. This is blood bank blood. Transfusion therapy is the safest it has ever been in the practice of medicine. Before blood is taken from a donor, a complete history is taken to make sure that person has no history of diseases nor engages in risky social behavior (examples are intravenous drug use or sexual activity with multiple partners). The donor's travel history is screened to minimize risk of transmitting infections, such as malaria. The donated blood is tested for signs of infectious diseases, such as HIV and hepatitis. The blood is then tested to be sure it is compatible with you in order to minimize the  chance of a transfusion reaction. If you or a relative donates blood, this is often done in anticipation of surgery and is not appropriate for emergency situations. It takes many days to process the donated blood. RISKS AND COMPLICATIONS Although transfusion therapy is very safe and saves many lives, the main dangers of transfusion include:  Getting an infectious disease. Developing a transfusion reaction. This is an allergic reaction to something in the blood you were given. Every precaution is taken to prevent this. The decision to have a blood transfusion has been considered carefully by your caregiver before blood is given. Blood is not given unless the benefits outweigh the risks. AFTER THE TRANSFUSION Right after receiving a blood transfusion, you will usually feel much better and more energetic. This is especially true if your red blood cells have gotten low (anemic). The transfusion raises the level of the red blood cells which carry oxygen, and this usually causes an energy increase. The nurse administering the transfusion will monitor you carefully for complications. HOME CARE INSTRUCTIONS  No special instructions are needed after a transfusion. You may find your energy is better. Speak with your caregiver about any limitations on activity for underlying diseases you may have. SEEK MEDICAL CARE IF:  Your condition is not improving after your transfusion. You develop redness or irritation at the intravenous (IV) site. SEEK IMMEDIATE MEDICAL CARE IF:  Any of the following symptoms occur over the next 12 hours: Shaking chills. You have a temperature by mouth above 102 F (38.9 C), not controlled by medicine. Chest, back, or muscle pain. People around you feel you are not acting correctly or are confused. Shortness of breath or difficulty breathing. Dizziness and fainting. You get a rash or develop hives. You have a decrease in urine output. Your urine turns a dark color or changes to  pink, red, or brown. Any of the following symptoms occur over the next 10 days: You have a temperature by mouth above 102 F (38.9 C), not controlled by medicine. Shortness of breath. Weakness after normal activity. The white part of the eye turns yellow (jaundice). You have a decrease in  the amount of urine or are urinating less often. Your urine turns a dark color or changes to pink, red, or brown. Document Released: 01/30/2000 Document Revised: 04/26/2011 Document Reviewed: 09/18/2007 [facility] Patient Information 2014 [place], [place].  _______________________________________________________________________

## 2023-02-16 NOTE — Progress Notes (Signed)
 PERIOPERATIVE PRESCRIPTION FOR IMPLANTED CARDIAC DEVICE PROGRAMMING  Patient Information: Name:  Jonathan Garner.  DOB:  1946/01/31  MRN:  [id number]    Planned Procedure: Right THA   Surgeon: Dr. Melodi  Date of Procedure: 02-28-2023  Cautery will be used.  Position during surgery: Supine   Please send documentation back to:  [facility] (Fax# [phone]) or Respond to the IB message.  Device Information:  Clinic EP Physician:  Soyla Norton, MD   Device Type:  Pacemaker Manufacturer and Phone #:  [employer]: [id number] Pacemaker Dependent?:  Yes.   Date of Last Device Check:  12/05/22 Normal Device Function?:  Yes.    Electrophysiologist's Recommendations:  Have magnet available. Provide continuous ECG monitoring when magnet is used or reprogramming is to be performed.  Procedure should not interfere with device function.  No device programming or magnet placement needed.  Per Device Clinic Standing Orders, Almarie ONEIDA Shutter, RN  7:33 PM 02/16/2023

## 2023-02-17 ENCOUNTER — Other Ambulatory Visit: Payer: Self-pay

## 2023-02-17 ENCOUNTER — Encounter (HOSPITAL_COMMUNITY): Payer: Self-pay

## 2023-02-17 ENCOUNTER — Encounter (HOSPITAL_COMMUNITY)
Admission: RE | Admit: 2023-02-17 | Discharge: 2023-02-17 | Disposition: A | Discharge status: Home / Self Care | Payer: PPO | Source: Ambulatory Visit | Attending: Orthopedic Surgery | Admitting: Orthopedic Surgery

## 2023-02-17 VITALS — BP 106/56 | HR 75 | Temp 98.2°F | Resp 16 | Ht 66.0 in | Wt 242.0 lb

## 2023-02-17 DIAGNOSIS — G4733 Obstructive sleep apnea (adult) (pediatric): Secondary | ICD-10-CM | POA: Insufficient documentation

## 2023-02-17 DIAGNOSIS — Z87891 Personal history of nicotine dependence: Secondary | ICD-10-CM | POA: Insufficient documentation

## 2023-02-17 DIAGNOSIS — Z8679 Personal history of other diseases of the circulatory system: Secondary | ICD-10-CM | POA: Diagnosis not present

## 2023-02-17 DIAGNOSIS — I5032 Chronic diastolic (congestive) heart failure: Secondary | ICD-10-CM | POA: Diagnosis not present

## 2023-02-17 DIAGNOSIS — Z950 Presence of cardiac pacemaker: Secondary | ICD-10-CM | POA: Diagnosis not present

## 2023-02-17 DIAGNOSIS — I2510 Atherosclerotic heart disease of native coronary artery without angina pectoris: Secondary | ICD-10-CM | POA: Diagnosis not present

## 2023-02-17 DIAGNOSIS — I110 Hypertensive heart disease with heart failure: Secondary | ICD-10-CM | POA: Insufficient documentation

## 2023-02-17 DIAGNOSIS — Z01812 Encounter for preprocedural laboratory examination: Secondary | ICD-10-CM | POA: Diagnosis present

## 2023-02-17 DIAGNOSIS — Z01818 Encounter for other preprocedural examination: Secondary | ICD-10-CM

## 2023-02-17 DIAGNOSIS — M1611 Unilateral primary osteoarthritis, right hip: Secondary | ICD-10-CM | POA: Diagnosis not present

## 2023-02-17 DIAGNOSIS — J449 Chronic obstructive pulmonary disease, unspecified: Secondary | ICD-10-CM | POA: Diagnosis not present

## 2023-02-17 DIAGNOSIS — I10 Essential (primary) hypertension: Secondary | ICD-10-CM

## 2023-02-17 HISTORY — DX: Acute myocardial infarction, unspecified: I21.9

## 2023-02-17 HISTORY — DX: Prediabetes: R73.03

## 2023-02-17 HISTORY — DX: Unspecified osteoarthritis, unspecified site: M19.90

## 2023-02-17 HISTORY — DX: Chronic obstructive pulmonary disease, unspecified: J44.9

## 2023-02-17 HISTORY — DX: Cardiac arrhythmia, unspecified: I49.9

## 2023-02-17 LAB — BASIC METABOLIC PANEL
Anion gap: 9 (ref 5–15)
BUN: 30 mg/dL — ABNORMAL HIGH (ref 8–23)
CO2: 21 mmol/L — ABNORMAL LOW (ref 22–32)
Calcium: 9.1 mg/dL (ref 8.9–10.3)
Chloride: 106 mmol/L (ref 98–111)
Creatinine, Ser: 1.53 mg/dL — ABNORMAL HIGH (ref 0.61–1.24)
GFR, Estimated: 46 mL/min — ABNORMAL LOW (ref >60)
Glucose, Bld: 99 mg/dL (ref 70–99)
Potassium: 4.9 mmol/L (ref 3.5–5.1)
Sodium: 136 mmol/L (ref 135–145)

## 2023-02-17 LAB — TYPE AND SCREEN
ABO/RH(D): O POS
Antibody Screen: NEGATIVE

## 2023-02-17 LAB — CBC
HCT: 40.2 % (ref 39.0–52.0)
Hemoglobin: 12.5 g/dL — ABNORMAL LOW (ref 13.0–17.0)
MCH: 27.9 pg (ref 26.0–34.0)
MCHC: 31.1 g/dL (ref 30.0–36.0)
MCV: 89.7 fL (ref 80.0–100.0)
Platelets: 253 10*3/uL (ref 150–400)
RBC: 4.48 MIL/uL (ref 4.22–5.81)
RDW: 15.2 % (ref 11.5–15.5)
WBC: 7.2 10*3/uL (ref 4.0–10.5)
nRBC: 0 % (ref 0.0–0.2)

## 2023-02-17 LAB — SURGICAL PCR SCREEN
MRSA, PCR: NEGATIVE
Staphylococcus aureus: NEGATIVE
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
## 2023-02-28 ENCOUNTER — Other Ambulatory Visit: Payer: Self-pay

## 2023-02-28 ENCOUNTER — Observation Stay (HOSPITAL_COMMUNITY)
Admission: RE | Admit: 2023-02-28 | Discharge: 2023-03-01 | Disposition: A | Discharge status: Home / Self Care | Payer: PPO | Attending: Orthopedic Surgery | Admitting: Orthopedic Surgery

## 2023-02-28 ENCOUNTER — Ambulatory Visit (HOSPITAL_COMMUNITY): Payer: PPO | Admitting: Physician Assistant

## 2023-02-28 ENCOUNTER — Encounter (HOSPITAL_COMMUNITY): Payer: Self-pay | Admitting: Orthopedic Surgery

## 2023-02-28 ENCOUNTER — Encounter (HOSPITAL_COMMUNITY)
Admission: RE | Disposition: A | Discharge status: Home / Self Care | Payer: Self-pay | Source: Home / Self Care | Attending: Orthopedic Surgery

## 2023-02-28 ENCOUNTER — Ambulatory Visit (HOSPITAL_COMMUNITY): Payer: PPO

## 2023-02-28 ENCOUNTER — Ambulatory Visit (HOSPITAL_BASED_OUTPATIENT_CLINIC_OR_DEPARTMENT_OTHER): Payer: PPO | Admitting: Anesthesiology

## 2023-02-28 ENCOUNTER — Observation Stay (HOSPITAL_COMMUNITY): Payer: PPO

## 2023-02-28 DIAGNOSIS — M1611 Unilateral primary osteoarthritis, right hip: Secondary | ICD-10-CM

## 2023-02-28 DIAGNOSIS — J449 Chronic obstructive pulmonary disease, unspecified: Secondary | ICD-10-CM | POA: Insufficient documentation

## 2023-02-28 DIAGNOSIS — I2510 Atherosclerotic heart disease of native coronary artery without angina pectoris: Secondary | ICD-10-CM | POA: Insufficient documentation

## 2023-02-28 DIAGNOSIS — Z950 Presence of cardiac pacemaker: Secondary | ICD-10-CM | POA: Diagnosis not present

## 2023-02-28 DIAGNOSIS — M169 Osteoarthritis of hip, unspecified: Principal | ICD-10-CM | POA: Diagnosis present

## 2023-02-28 DIAGNOSIS — I5032 Chronic diastolic (congestive) heart failure: Secondary | ICD-10-CM | POA: Insufficient documentation

## 2023-02-28 DIAGNOSIS — Z87891 Personal history of nicotine dependence: Secondary | ICD-10-CM | POA: Insufficient documentation

## 2023-02-28 DIAGNOSIS — I110 Hypertensive heart disease with heart failure: Secondary | ICD-10-CM | POA: Insufficient documentation

## 2023-02-28 DIAGNOSIS — I509 Heart failure, unspecified: Secondary | ICD-10-CM | POA: Insufficient documentation

## 2023-02-28 DIAGNOSIS — Z79899 Other long term (current) drug therapy: Secondary | ICD-10-CM | POA: Insufficient documentation

## 2023-02-28 DIAGNOSIS — Z7901 Long term (current) use of anticoagulants: Secondary | ICD-10-CM | POA: Diagnosis not present

## 2023-02-28 DIAGNOSIS — F17210 Nicotine dependence, cigarettes, uncomplicated: Secondary | ICD-10-CM

## 2023-02-28 DIAGNOSIS — I4891 Unspecified atrial fibrillation: Secondary | ICD-10-CM | POA: Insufficient documentation

## 2023-02-28 DIAGNOSIS — Z7902 Long term (current) use of antithrombotics/antiplatelets: Secondary | ICD-10-CM | POA: Diagnosis not present

## 2023-02-28 HISTORY — PX: TOTAL HIP ARTHROPLASTY: SHX124

## 2023-02-28 HISTORY — DX: Presence of cardiac pacemaker: Z95.0

## 2023-02-28 SURGERY — ARTHROPLASTY, HIP, TOTAL, ANTERIOR APPROACH
Anesthesia: General | Site: Hip | Laterality: Right

## 2023-02-28 MED ORDER — PHENOL 1.4 % MT LIQD: 1.0000 | OROMUCOSAL | Status: DC | PRN

## 2023-02-28 MED ORDER — ONDANSETRON HCL 4 MG/2ML IJ SOLN: 4.0000 mg | Freq: Four times a day (QID) | INTRAMUSCULAR | Status: DC | PRN

## 2023-02-28 MED ORDER — SACUBITRIL-VALSARTAN 24-26 MG PO TABS
1.0000 | ORAL_TABLET | Freq: Two times a day (BID) | ORAL | Status: DC
  Administered 2023-02-28 – 2023-03-01 (×2): 1 via ORAL
  Filled 2023-02-28 (×2): qty 1

## 2023-02-28 MED ORDER — PROPOFOL 10 MG/ML IV BOLUS
INTRAVENOUS | Status: AC
  Filled 2023-02-28: qty 20

## 2023-02-28 MED ORDER — PHENYLEPHRINE 80 MCG/ML (10ML) SYRINGE FOR IV PUSH (FOR BLOOD PRESSURE SUPPORT)
PREFILLED_SYRINGE | INTRAVENOUS | Status: DC | PRN
  Administered 2023-02-28: 160 ug via INTRAVENOUS

## 2023-02-28 MED ORDER — OXYCODONE HCL 5 MG PO TABS
5.0000 mg | ORAL_TABLET | ORAL | Status: DC | PRN
Start: 2023-02-28 — End: 2023-03-01
  Administered 2023-03-01: 5 mg via ORAL
  Filled 2023-02-28: qty 2
  Filled 2023-02-28: qty 1

## 2023-02-28 MED ORDER — ACETAMINOPHEN 325 MG PO TABS
325.0000 mg | ORAL_TABLET | Freq: Four times a day (QID) | ORAL | Status: DC | PRN
  Administered 2023-03-01: 650 mg via ORAL
  Filled 2023-02-28: qty 2

## 2023-02-28 MED ORDER — TRAMADOL HCL 50 MG PO TABS
50.0000 mg | ORAL_TABLET | Freq: Four times a day (QID) | ORAL | Status: DC
  Administered 2023-02-28 – 2023-03-01 (×3): 50 mg via ORAL
  Filled 2023-02-28 (×3): qty 1

## 2023-02-28 MED ORDER — METOPROLOL SUCCINATE ER 25 MG PO TB24
25.0000 mg | ORAL_TABLET | Freq: Every day | ORAL | Status: DC
  Administered 2023-02-28: 25 mg via ORAL
  Filled 2023-02-28: qty 1

## 2023-02-28 MED ORDER — ROCURONIUM BROMIDE 10 MG/ML (PF) SYRINGE
PREFILLED_SYRINGE | INTRAVENOUS | Status: DC | PRN
  Administered 2023-02-28: 60 mg via INTRAVENOUS
  Administered 2023-02-28: 20 mg via INTRAVENOUS

## 2023-02-28 MED ORDER — APIXABAN 2.5 MG PO TABS
2.5000 mg | ORAL_TABLET | Freq: Two times a day (BID) | ORAL | Status: DC
  Administered 2023-03-01: 2.5 mg via ORAL
  Filled 2023-02-28: qty 1

## 2023-02-28 MED ORDER — HYDROMORPHONE HCL 1 MG/ML IJ SOLN
INTRAMUSCULAR | Status: AC
  Filled 2023-02-28: qty 1

## 2023-02-28 MED ORDER — SODIUM CHLORIDE 0.9 % IV SOLN: INTRAVENOUS | Status: DC

## 2023-02-28 MED ORDER — DEXAMETHASONE SODIUM PHOSPHATE 10 MG/ML IJ SOLN
INTRAMUSCULAR | Status: DC | PRN
  Administered 2023-02-28: 10 mg via INTRAVENOUS

## 2023-02-28 MED ORDER — LACTATED RINGERS IV SOLN: INTRAVENOUS | Status: DC

## 2023-02-28 MED ORDER — MAGNESIUM CITRATE PO SOLN: 1.0000 | Freq: Once | ORAL | Status: DC | PRN

## 2023-02-28 MED ORDER — DOCUSATE SODIUM 100 MG PO CAPS
100.0000 mg | ORAL_CAPSULE | Freq: Two times a day (BID) | ORAL | Status: DC
  Administered 2023-02-28 – 2023-03-01 (×2): 100 mg via ORAL
  Filled 2023-02-28 (×2): qty 1

## 2023-02-28 MED ORDER — PHENYLEPHRINE 80 MCG/ML (10ML) SYRINGE FOR IV PUSH (FOR BLOOD PRESSURE SUPPORT)
PREFILLED_SYRINGE | INTRAVENOUS | Status: AC
  Filled 2023-02-28: qty 10

## 2023-02-28 MED ORDER — POTASSIUM CHLORIDE CRYS ER 20 MEQ PO TBCR
20.0000 meq | EXTENDED_RELEASE_TABLET | Freq: Every day | ORAL | Status: DC
Start: 2023-03-01 — End: 2023-03-01
  Administered 2023-03-01: 20 meq via ORAL
  Filled 2023-02-28: qty 1

## 2023-02-28 MED ORDER — HYDROMORPHONE HCL 1 MG/ML IJ SOLN
0.2500 mg | INTRAMUSCULAR | Status: DC | PRN
  Administered 2023-02-28 (×4): 0.5 mg via INTRAVENOUS

## 2023-02-28 MED ORDER — BISACODYL 10 MG RE SUPP: 10.0000 mg | Freq: Every day | RECTAL | Status: DC | PRN

## 2023-02-28 MED ORDER — PROPOFOL 10 MG/ML IV BOLUS
INTRAVENOUS | Status: DC | PRN
  Administered 2023-02-28: 150 mg via INTRAVENOUS

## 2023-02-28 MED ORDER — ONDANSETRON HCL 4 MG/2ML IJ SOLN
INTRAMUSCULAR | Status: DC | PRN
  Administered 2023-02-28: 4 mg via INTRAVENOUS

## 2023-02-28 MED ORDER — CEFAZOLIN SODIUM-DEXTROSE 2-4 GM/100ML-% IV SOLN
2.0000 g | INTRAVENOUS | Status: AC
  Administered 2023-02-28: 2 g via INTRAVENOUS
  Filled 2023-02-28: qty 100

## 2023-02-28 MED ORDER — PROPOFOL 1000 MG/100ML IV EMUL
INTRAVENOUS | Status: AC
  Filled 2023-02-28: qty 100

## 2023-02-28 MED ORDER — LIDOCAINE HCL (PF) 2 % IJ SOLN
INTRAMUSCULAR | Status: AC
  Filled 2023-02-28: qty 5

## 2023-02-28 MED ORDER — ORAL CARE MOUTH RINSE: 15.0000 mL | OROMUCOSAL | Status: DC | PRN

## 2023-02-28 MED ORDER — FENTANYL CITRATE PF 50 MCG/ML IJ SOSY
PREFILLED_SYRINGE | INTRAMUSCULAR | Status: AC
  Filled 2023-02-28: qty 3

## 2023-02-28 MED ORDER — BUPIVACAINE-EPINEPHRINE 0.25% -1:200000 IJ SOLN
INTRAMUSCULAR | Status: AC
  Filled 2023-02-28: qty 1

## 2023-02-28 MED ORDER — FENTANYL CITRATE (PF) 100 MCG/2ML IJ SOLN
INTRAMUSCULAR | Status: DC | PRN
  Administered 2023-02-28 (×2): 50 ug via INTRAVENOUS

## 2023-02-28 MED ORDER — LIDOCAINE 2% (20 MG/ML) 5 ML SYRINGE
INTRAMUSCULAR | Status: DC | PRN
  Administered 2023-02-28: 100 mg via INTRAVENOUS

## 2023-02-28 MED ORDER — DEXAMETHASONE SODIUM PHOSPHATE 10 MG/ML IJ SOLN: 8.0000 mg | Freq: Once | INTRAMUSCULAR | Status: AC

## 2023-02-28 MED ORDER — ONDANSETRON HCL 4 MG PO TABS: 4.0000 mg | ORAL_TABLET | Freq: Four times a day (QID) | ORAL | Status: DC | PRN

## 2023-02-28 MED ORDER — CEFAZOLIN SODIUM-DEXTROSE 2-4 GM/100ML-% IV SOLN
2.0000 g | Freq: Four times a day (QID) | INTRAVENOUS | Status: AC
  Administered 2023-02-28 – 2023-03-01 (×2): 2 g via INTRAVENOUS
  Filled 2023-02-28 (×2): qty 100

## 2023-02-28 MED ORDER — EMPAGLIFLOZIN 10 MG PO TABS
10.0000 mg | ORAL_TABLET | Freq: Every day | ORAL | Status: DC
  Administered 2023-03-01: 10 mg via ORAL
  Filled 2023-02-28: qty 1

## 2023-02-28 MED ORDER — METOCLOPRAMIDE HCL 5 MG/ML IJ SOLN: 5.0000 mg | Freq: Three times a day (TID) | INTRAMUSCULAR | Status: DC | PRN

## 2023-02-28 MED ORDER — ACETAMINOPHEN 500 MG PO TABS: 1000.0000 mg | ORAL_TABLET | Freq: Once | ORAL | Status: DC

## 2023-02-28 MED ORDER — DEXAMETHASONE SODIUM PHOSPHATE 10 MG/ML IJ SOLN
10.0000 mg | Freq: Once | INTRAMUSCULAR | Status: AC
  Administered 2023-03-01: 10 mg via INTRAVENOUS
  Filled 2023-02-28: qty 1

## 2023-02-28 MED ORDER — FENTANYL CITRATE (PF) 100 MCG/2ML IJ SOLN
INTRAMUSCULAR | Status: AC
  Filled 2023-02-28: qty 2

## 2023-02-28 MED ORDER — OXYCODONE HCL 5 MG/5ML PO SOLN
5.0000 mg | Freq: Once | ORAL | Status: DC | PRN
Start: 2023-02-28 — End: 2023-02-28

## 2023-02-28 MED ORDER — DEXAMETHASONE SODIUM PHOSPHATE 10 MG/ML IJ SOLN
INTRAMUSCULAR | Status: AC
  Filled 2023-02-28: qty 1

## 2023-02-28 MED ORDER — MENTHOL 3 MG MT LOZG: 1.0000 | LOZENGE | OROMUCOSAL | Status: DC | PRN

## 2023-02-28 MED ORDER — CLOPIDOGREL BISULFATE 75 MG PO TABS
75.0000 mg | ORAL_TABLET | Freq: Every day | ORAL | Status: DC
Start: 2023-03-01 — End: 2023-03-01
  Administered 2023-03-01: 75 mg via ORAL
  Filled 2023-02-28: qty 1

## 2023-02-28 MED ORDER — TORSEMIDE 20 MG PO TABS
20.0000 mg | ORAL_TABLET | Freq: Every day | ORAL | Status: DC
  Filled 2023-02-28: qty 1

## 2023-02-28 MED ORDER — MORPHINE SULFATE (PF) 2 MG/ML IV SOLN
0.5000 mg | INTRAVENOUS | Status: DC | PRN
Start: 2023-02-28 — End: 2023-03-01

## 2023-02-28 MED ORDER — CHLORHEXIDINE GLUCONATE 0.12 % MT SOLN
15.0000 mL | Freq: Once | OROMUCOSAL | Status: AC
  Administered 2023-02-28: 15 mL via OROMUCOSAL

## 2023-02-28 MED ORDER — FENTANYL CITRATE PF 50 MCG/ML IJ SOSY
50.0000 ug | PREFILLED_SYRINGE | Freq: Once | INTRAMUSCULAR | Status: AC
Start: 2023-02-28 — End: 2023-02-28

## 2023-02-28 MED ORDER — ORAL CARE MOUTH RINSE: 15.0000 mL | Freq: Once | OROMUCOSAL | Status: AC

## 2023-02-28 MED ORDER — ONDANSETRON HCL 4 MG/2ML IJ SOLN
INTRAMUSCULAR | Status: AC
  Filled 2023-02-28: qty 2

## 2023-02-28 MED ORDER — METOCLOPRAMIDE HCL 5 MG PO TABS: 5.0000 mg | ORAL_TABLET | Freq: Three times a day (TID) | ORAL | Status: DC | PRN

## 2023-02-28 MED ORDER — SUGAMMADEX SODIUM 200 MG/2ML IV SOLN
INTRAVENOUS | Status: DC | PRN
  Administered 2023-02-28: 200 mg via INTRAVENOUS

## 2023-02-28 MED ORDER — TRANEXAMIC ACID-NACL 1000-0.7 MG/100ML-% IV SOLN
1000.0000 mg | INTRAVENOUS | Status: AC
  Administered 2023-02-28: 1000 mg via INTRAVENOUS
  Filled 2023-02-28: qty 100

## 2023-02-28 MED ORDER — METHOCARBAMOL 1000 MG/10ML IJ SOLN: 500.0000 mg | Freq: Four times a day (QID) | INTRAMUSCULAR | Status: DC | PRN

## 2023-02-28 MED ORDER — FENTANYL CITRATE PF 50 MCG/ML IJ SOSY
25.0000 ug | PREFILLED_SYRINGE | INTRAMUSCULAR | Status: DC | PRN
  Administered 2023-02-28 (×2): 50 ug via INTRAVENOUS

## 2023-02-28 MED ORDER — OXYCODONE HCL 5 MG PO TABS: 5.0000 mg | ORAL_TABLET | Freq: Once | ORAL | Status: DC | PRN

## 2023-02-28 MED ORDER — BUPIVACAINE-EPINEPHRINE (PF) 0.25% -1:200000 IJ SOLN
INTRAMUSCULAR | Status: DC | PRN
  Administered 2023-02-28: 30 mL via PERINEURAL

## 2023-02-28 MED ORDER — POVIDONE-IODINE 10 % EX SWAB
2.0000 | Freq: Once | CUTANEOUS | Status: AC
  Administered 2023-02-28: 2 via TOPICAL

## 2023-02-28 MED ORDER — ACETAMINOPHEN 10 MG/ML IV SOLN
1000.0000 mg | Freq: Four times a day (QID) | INTRAVENOUS | Status: DC
  Administered 2023-02-28: 1000 mg via INTRAVENOUS
  Filled 2023-02-28: qty 100

## 2023-02-28 MED ORDER — EPHEDRINE 5 MG/ML INJ
INTRAVENOUS | Status: AC
  Filled 2023-02-28: qty 5

## 2023-02-28 MED ORDER — DIPHENHYDRAMINE HCL 50 MG/ML IJ SOLN
INTRAMUSCULAR | Status: AC
  Filled 2023-02-28: qty 1

## 2023-02-28 MED ORDER — SPIRONOLACTONE 12.5 MG HALF TABLET
12.5000 mg | ORAL_TABLET | Freq: Every day | ORAL | Status: DC
Start: 2023-03-01 — End: 2023-03-01
  Filled 2023-02-28: qty 1

## 2023-02-28 MED ORDER — AMISULPRIDE (ANTIEMETIC) 5 MG/2ML IV SOLN: 10.0000 mg | Freq: Once | INTRAVENOUS | Status: DC | PRN

## 2023-02-28 MED ORDER — POLYETHYLENE GLYCOL 3350 17 G PO PACK: 17.0000 g | PACK | Freq: Every day | ORAL | Status: DC | PRN

## 2023-02-28 MED ORDER — METHOCARBAMOL 500 MG PO TABS
500.0000 mg | ORAL_TABLET | Freq: Four times a day (QID) | ORAL | Status: DC | PRN
  Administered 2023-03-01: 500 mg via ORAL
  Filled 2023-02-28: qty 1

## 2023-02-28 MED ORDER — 0.9 % SODIUM CHLORIDE (POUR BTL) OPTIME
TOPICAL | Status: DC | PRN
  Administered 2023-02-28: 1000 mL

## 2023-02-28 SURGICAL SUPPLY — 37 items
BAG COUNTER SPONGE SURGICOUNT (BAG) IMPLANT
BAG ZIPLOCK 12X15 (MISCELLANEOUS) IMPLANT
BLADE SAG 18X100X1.27 (BLADE) ×1 IMPLANT
COVER PERINEAL POST (MISCELLANEOUS) ×1 IMPLANT
COVER SURGICAL LIGHT HANDLE (MISCELLANEOUS) ×1 IMPLANT
CUP ACETBLR 54 OD PINNACLE (Hips) IMPLANT
DERMABOND ADVANCED .7 DNX12 (GAUZE/BANDAGES/DRESSINGS) ×1 IMPLANT
DRAPE FOOT SWITCH (DRAPES) ×1 IMPLANT
DRAPE STERI IOBAN 125X83 (DRAPES) ×1 IMPLANT
DRAPE U-SHAPE 47X51 STRL (DRAPES) ×2 IMPLANT
DRSG AQUACEL AG ADV 3.5X10 (GAUZE/BANDAGES/DRESSINGS) ×1 IMPLANT
DURAPREP 26ML APPLICATOR (WOUND CARE) ×1 IMPLANT
ELECT REM PT RETURN 15FT ADLT (MISCELLANEOUS) ×1 IMPLANT
GLOVE BIO SURGEON STRL SZ 6.5 (GLOVE) ×1 IMPLANT
GLOVE BIO SURGEON STRL SZ8 (GLOVE) ×1 IMPLANT
GLOVE BIOGEL PI IND STRL 6.5 (GLOVE) IMPLANT
GLOVE BIOGEL PI IND STRL 7.0 (GLOVE) IMPLANT
GLOVE BIOGEL PI IND STRL 8 (GLOVE) ×1 IMPLANT
GOWN STRL REUS W/ TWL LRG LVL3 (GOWN DISPOSABLE) ×2 IMPLANT
HEAD M SROM 36MM PLUS 1.5 (Hips) IMPLANT
HOLDER FOLEY CATH W/STRAP (MISCELLANEOUS) ×1 IMPLANT
KIT TURNOVER KIT A (KITS) IMPLANT
LINER MARATHON NEUT +4X54X36 (Hips) IMPLANT
MANIFOLD NEPTUNE II (INSTRUMENTS) ×1 IMPLANT
PACK ANTERIOR HIP CUSTOM (KITS) ×1 IMPLANT
PENCIL SMOKE EVACUATOR COATED (MISCELLANEOUS) ×1 IMPLANT
SPIKE FLUID TRANSFER (MISCELLANEOUS) ×1 IMPLANT
SROM M HEAD 36MM PLUS 1.5 (Hips) ×1 IMPLANT
STEM FEMORAL SZ8 STD ACTIS (Stem) IMPLANT
SUT ETHIBOND NAB CT1 #1 30IN (SUTURE) ×1 IMPLANT
SUT MNCRL AB 4-0 PS2 18 (SUTURE) ×1 IMPLANT
SUT STRATAFIX 0 PDS 27 VIOLET (SUTURE) ×1
SUT VIC AB 2-0 CT1 TAPERPNT 27 (SUTURE) ×2 IMPLANT
SUTURE STRATFX 0 PDS 27 VIOLET (SUTURE) ×1 IMPLANT
TOWEL GREEN STERILE FF (TOWEL DISPOSABLE) ×1 IMPLANT
TRAY FOLEY MTR SLVR 16FR STAT (SET/KITS/TRAYS/PACK) ×1 IMPLANT
TUBE SUCTION HIGH CAP CLEAR NV (SUCTIONS) ×1 IMPLANT

## 2023-02-28 NOTE — Op Note (Signed)
 OPERATIVE REPORT- TOTAL HIP ARTHROPLASTY   PREOPERATIVE DIAGNOSIS: Osteoarthritis of the Right hip.   POSTOPERATIVE DIAGNOSIS: Osteoarthritis of the Right  hip.   PROCEDURE: Right total hip arthroplasty, anterior approach.   SURGEON: Dempsey Moan, MD   ASSISTANT: Zelda Kobs, PA-C  ANESTHESIA:  General  ESTIMATED BLOOD LOSS:-300 mL    DRAINS: None  COMPLICATIONS: None   CONDITION: PACU - hemodynamically stable.   BRIEF CLINICAL NOTE: Jonathan Garner. is a 78 y.o. male who has advanced end-  stage arthritis of their Right  hip with progressively worsening pain and  dysfunction.The patient has failed nonoperative management and presents for  total hip arthroplasty.   PROCEDURE IN DETAIL: After successful administration of spinal  anesthetic, the traction boots for the [facility] bed were placed on both  feet and the patient was placed onto the [facility] bed, boots placed into the leg  holders. The Right hip was then isolated from the perineum with plastic  drapes and prepped and draped in the usual sterile fashion. ASIS and  greater trochanter were marked and a oblique incision was made, starting  at about 1 cm lateral and 2 cm distal to the ASIS and coursing towards  the anterior cortex of the femur. The skin was cut with a 10 blade  through subcutaneous tissue to the level of the fascia overlying the  tensor fascia lata muscle. The fascia was then incised in line with the  incision at the junction of the anterior third and posterior 2/3rd. The  muscle was teased off the fascia and then the interval between the TFL  and the rectus was developed. The Hohmann retractor was then placed at  the top of the femoral neck over the capsule. The vessels overlying the  capsule were cauterized and the fat on top of the capsule was removed.  A Hohmann retractor was then placed anterior underneath the rectus  femoris to give exposure to the entire anterior capsule. A T-shaped   capsulotomy was performed. The edges were tagged and the femoral head  was identified.       Osteophytes are removed off the superior acetabulum.  The femoral neck was then cut in situ with an oscillating saw. Traction  was then applied to the left lower extremity utilizing the [facility]  traction. The femoral head was then removed. Retractors were placed  around the acetabulum and then circumferential removal of the labrum was  performed. Osteophytes were also removed. Reaming starts at 49 mm to  medialize and  Increased in 2 mm increments to 53 mm. We reamed in  approximately 40 degrees of abduction, 20 degrees anteversion. A 54 mm  pinnacle acetabular shell was then impacted in anatomic position under  fluoroscopic guidance with excellent purchase. We did not need to place  any additional dome screws. A 36 mm neutral + 4 marathon liner was then  placed into the acetabular shell.       The femoral lift was then placed along the lateral aspect of the femur  just distal to the vastus ridge. The leg was  externally rotated and capsule  was stripped off the inferior aspect of the femoral neck down to the  level of the lesser trochanter, this was done with electrocautery. The femur was lifted after this was performed. The  leg was then placed in an extended and adducted position essentially delivering the femur. We also removed the capsule superiorly and the piriformis from the piriformis fossa  to gain excellent exposure of the  proximal femur. Rongeur was used to remove some cancellous bone to get  into the lateral portion of the proximal femur for placement of the  initial starter reamer. The starter broaches was placed  the starter broach  and was shown to go down the center of the canal. Broaching  with the Actis system was then performed starting at size 0  coursing  Up to size 8. A size 8 had excellent torsional and rotational  and axial stability. The trial standard offset neck was then  placed  with a 36 + 1.5 trial head. The hip was then reduced. We confirmed that  the stem was in the canal both on AP and lateral x-rays. It also has excellent sizing. The hip was reduced with outstanding stability through full extension and full external rotation.. AP pelvis was taken and the leg lengths were measured and found to be equal. Hip was then dislocated again and the femoral head and neck removed. The  femoral broach was removed. Size 8 Actis stem with a standard offset  neck was then impacted into the femur following native anteversion. Has  excellent purchase in the canal. Excellent torsional and rotational and  axial stability. It is confirmed to be in the canal on AP and lateral  fluoroscopic views. The 36 + 1.5 metal head was placed and the hip  reduced with outstanding stability. Again AP pelvis was taken and it  confirmed that the leg lengths were equal. The wound was then copiously  irrigated with saline solution and the capsule reattached and repaired  with Ethibond suture. 30 ml of .25% Bupivicaine was  injected into the capsule and into the edge of the tensor fascia lata as well as subcutaneous tissue. The fascia overlying the tensor fascia lata was then closed with a running #1 V-Loc. Subcu was closed with interrupted 2-0 Vicryl and subcuticular running 4-0 Monocryl. Incision was cleaned  and dried. Steri-Strips and a bulky sterile dressing applied. The patient was awakened and transported to  recovery in stable condition.        Please note that a surgical assistant was a medical necessity for this procedure to perform it in a safe and expeditious manner. Assistant was necessary to provide appropriate retraction of vital neurovascular structures and to prevent femoral fracture and allow for anatomic placement of the prosthesis.  Dempsey Moan, M.D.

## 2023-02-28 NOTE — Transfer of Care (Signed)
 Immediate Anesthesia Transfer of Care Note  Patient: Jonathan Garner.  Procedure(s) Performed: Procedure(s): TOTAL HIP ARTHROPLASTY ANTERIOR APPROACH (Right)  Patient Location: PACU  Anesthesia Type:General  Level of Consciousness: Alert, Awake, Oriented  Airway & Oxygen Therapy: Patient Spontanous Breathing  Post-op Assessment: Report given to RN  Post vital signs: Reviewed and stable  Last Vitals:  Vitals:   02/28/23 1008  BP: (!) 154/69  Resp: 15  Temp: 36.5 C    Complications: No apparent anesthesia complications

## 2023-02-28 NOTE — Anesthesia Preprocedure Evaluation (Addendum)
 Anesthesia Evaluation  Patient identified by MRN, date of birth, ID band Patient awake    Reviewed: Allergy & Precautions, NPO status , Patient's Chart, lab work & pertinent test results  History of Anesthesia Complications Negative for: history of anesthetic complications  Airway Mallampati: III  TM Distance: >3 FB Neck ROM: Full    Dental  (+) Edentulous Upper, Edentulous Lower, Dental Advisory Given   Pulmonary neg shortness of breath, sleep apnea and Continuous Positive Airway Pressure Ventilation , COPD (denies, no inhaler use), neg recent URI, former smoker   Pulmonary exam normal breath sounds clear to auscultation       Cardiovascular hypertension, Pt. on home beta blockers (-) angina + CAD, + Past MI, +CHF and + DOE  (-) Cardiac Stents and (-) CABG + dysrhythmias (complete HB, afib s/p CV) + pacemaker  Rhythm:Regular Rate:Normal  AAA s/p repair, HLD  R/LHC 08/12/2022:   Previously placed Ost LAD to Prox LAD stent of unknown type is  widely patent.   Previously placed Prox RCA to Mid RCA stent of unknown type is  widely patent.   1.  Widely patent proximal LAD and mid RCA stents with minimal luminal irregularities elsewhere. 2.  Fick cardiac output of 5.7 L/min and Fick cardiac index of 2.7 L/min/m with the following hemodynamics:   Right atrial pressure mean 2 mmHg Right ventricular pressure 31/-2 with an end-diastolic pressure of 4 mmHg Wedge pressure mean 4 mmHg Pulmonary artery pressure 25/5 with a mean of 14 mmHg  TTE 07/22/2022: IMPRESSIONS     1. Poor Echo windows. Left ventricular ejection fraction, by estimation,  is 30 to 40%. The left ventricle has moderately decreased function.  Recommend Limited Echo with contrast for accurate LVEF and RWMA  assessment. Left ventricular endocardial border  not optimally defined to evaluate regional wall motion. There is mild left  ventricular hypertrophy. Left  ventricular diastolic parameters are  consistent with Grade I diastolic dysfunction (impaired relaxation).   2. Right ventricular systolic function is normal. The right ventricular  size is normal. There is normal pulmonary artery systolic pressure.   3. The mitral valve is abnormal. No evidence of mitral valve  regurgitation. No evidence of mitral stenosis. Severe mitral annular  calcification.   4. The aortic valve was not well visualized. Aortic valve regurgitation  is not visualized. No aortic stenosis is present.   5. Aortic dilatation noted. There is borderline dilatation of the  ascending aorta, measuring 38 mm.   6. The inferior vena cava is normal in size with greater than 50%  respiratory variability, suggesting right atrial pressure of 3 mmHg.      Neuro/Psych neg Seizures  Neuromuscular disease (lumbar spinal stenosis)    GI/Hepatic Neg liver ROS,neg GERD  ,,Diverticulosis    Endo/Other  negative endocrine ROS  Pre-diabetes  Renal/GU negative Renal ROS     Musculoskeletal  (+) Arthritis , Osteoarthritis,    Abdominal  (+) + obese  Peds  Hematology negative hematology ROS (+) Lab Results      Component                Value               Date                      WBC                      7.2  02/17/2023                HGB                      12.5 (L)            02/17/2023                HCT                      40.2                02/17/2023                MCV                      89.7                02/17/2023                PLT                      253                 02/17/2023              Anesthesia Other Findings Last Eliquis : 02/24/2023  Last Plavix : 02/24/2023  Reproductive/Obstetrics                              Anesthesia Physical Anesthesia Plan  ASA: 3  Anesthesia Plan: General   Post-op Pain Management: Tylenol  PO (pre-op)*   Induction: Intravenous  PONV Risk Score and Plan: 2 and Ondansetron ,  Dexamethasone  and Treatment may vary due to age or medical condition  Airway Management Planned: Simple Face Mask and Oral ETT  Additional Equipment:   Intra-op Plan:   Post-operative Plan: Extubation in OR  Informed Consent: I have reviewed the patients History and Physical, chart, labs and discussed the procedure including the risks, benefits and alternatives for the proposed anesthesia with the patient or authorized representative who has indicated his/her understanding and acceptance.     Dental advisory given  Plan Discussed with: CRNA and Anesthesiologist  Anesthesia Plan Comments: (Patient took his Plavix  4 days ago. Will proceed with GA.  Risks of general anesthesia discussed including, but not limited to, sore throat, hoarse voice, chipped/damaged teeth, injury to vocal cords, nausea and vomiting, allergic reactions, lung infection, heart attack, stroke, and death. All questions answered.  )         Anesthesia Quick Evaluation

## 2023-02-28 NOTE — Discharge Instructions (Addendum)
 Dempsey Moan, MD Total Joint Specialist [facility] [address] [place], [place] [address] [phone]  ANTERIOR APPROACH TOTAL HIP REPLACEMENT POSTOPERATIVE DIRECTIONS     Hip Rehabilitation, Guidelines Following Surgery  The results of a hip operation are greatly improved after range of motion and muscle strengthening exercises. Follow all safety measures which are given to protect your hip. If any of these exercises cause increased pain or swelling in your joint, decrease the amount until you are comfortable again. Then slowly increase the exercises. Call your caregiver if you have problems or questions.   BLOOD CLOT PREVENTION Resume your normal dose of Eliquis  and Plavix  upon discharge from the hospital. You may resume your vitamins/supplements once you have been discharged from the hospital. Do not take any NSAIDs (Advil, Aleve, Ibuprofen, Meloxicam, etc.) while taking Eliquis  or Plavix .  HOME CARE INSTRUCTIONS  Remove items at home which could result in a fall. This includes throw rugs or furniture in walking pathways.  ICE to the affected hip as frequently as 20-30 minutes an hour and then as needed for pain and swelling. Continue to use ice on the hip for pain and swelling from surgery. You may notice swelling that will progress down to the foot and ankle. This is normal after surgery. Elevate the leg when you are not up walking on it.   Continue to use the breathing machine which will help keep your temperature down.  It is common for your temperature to cycle up and down following surgery, especially at night when you are not up moving around and exerting yourself.  The breathing machine keeps your lungs expanded and your temperature down.  DIET You may resume your previous home diet once your are discharged from the hospital.  DRESSING / WOUND CARE / SHOWERING You have an adhesive waterproof bandage over the incision. Leave this in place until  your first follow-up appointment. Once you remove this you will not need to place another bandage.  You may begin showering 3 days following surgery, but do not submerge the incision under water.  ACTIVITY For the first 3-5 days, it is important to rest and keep the operative leg elevated. You should, as a general rule, rest for 50 minutes and walk/stretch for 10 minutes per hour. After 5 days, you may slowly increase activity as tolerated.  Perform the exercises you were provided twice a day for about 15-20 minutes each session. Begin these 2 days following surgery. Walk with your walker as instructed. Use the walker until you are comfortable transitioning to a cane. Walk with the cane in the opposite hand of the operative leg. You may discontinue the cane once you are comfortable and walking steadily. Avoid periods of inactivity such as sitting longer than an hour when not asleep. This helps prevent blood clots.  Do not drive a car for 6 weeks or until released by your surgeon.  Do not drive while taking narcotics.  TED HOSE STOCKINGS Wear the elastic stockings on both legs for three weeks following surgery during the day. You may remove them at night while sleeping.  WEIGHT BEARING Weight bearing as tolerated with assist device (walker, cane, etc) as directed, use it as long as suggested by your surgeon or therapist, typically at least 4-6 weeks.  POSTOPERATIVE CONSTIPATION PROTOCOL Constipation - defined medically as fewer than three stools per week and severe constipation as less than one stool per week.  One of the most common issues patients have following  surgery is constipation.  Even if you have a regular bowel pattern at home, your normal regimen is likely to be disrupted due to multiple reasons following surgery.  Combination of anesthesia, postoperative narcotics, change in appetite and fluid intake all can affect your bowels.  In order to avoid complications following surgery, here  are some recommendations in order to help you during your recovery period.  Colace (docusate) - Pick up an over-the-counter form of Colace or another stool softener and take twice a day as long as you are requiring postoperative pain medications.  Take with a full glass of water daily.  If you experience loose stools or diarrhea, hold the colace until you stool forms back up.  If your symptoms do not get better within 1 week or if they get worse, check with your doctor. Dulcolax (bisacodyl ) - Pick up over-the-counter and take as directed by the product packaging as needed to assist with the movement of your bowels.  Take with a full glass of water.  Use this product as needed if not relieved by Colace only.  MiraLax  (polyethylene glycol) - Pick up over-the-counter to have on hand.  MiraLax  is a solution that will increase the amount of water in your bowels to assist with bowel movements.  Take as directed and can mix with a glass of water, juice, soda, coffee, or tea.  Take if you go more than two days without a movement.Do not use MiraLax  more than once per day. Call your doctor if you are still constipated or irregular after using this medication for 7 days in a row.  If you continue to have problems with postoperative constipation, please contact the office for further assistance and recommendations.  If you experience the worst abdominal pain ever or develop nausea or vomiting, please contact the office immediatly for further recommendations for treatment.  ITCHING  If you experience itching with your medications, try taking only a single pain pill, or even half a pain pill at a time.  You can also use Benadryl  over the counter for itching or also to help with sleep.   MEDICATIONS See your medication summary on the "After Visit Summary" that the nursing staff will review with you prior to discharge.  You may have some home medications which will be placed on hold until you complete the course of  blood thinner medication.  It is important for you to complete the blood thinner medication as prescribed by your surgeon.  Continue your approved medications as instructed at time of discharge.  PRECAUTIONS If you experience chest pain or shortness of breath - call 911 immediately for transfer to the hospital emergency department.  If you develop a fever greater that 101 F, purulent drainage from wound, increased redness or drainage from wound, foul odor from the wound/dressing, or calf pain - CONTACT YOUR SURGEON.                                                   FOLLOW-UP APPOINTMENTS Make sure you keep all of your appointments after your operation with your surgeon and caregivers. You should call the office at the above phone number and make an appointment for approximately two weeks after the date of your surgery or on the date instructed by your surgeon outlined in the After Visit Summary.  RANGE OF MOTION AND  STRENGTHENING EXERCISES  These exercises are designed to help you keep full movement of your hip joint. Follow your caregiver's or physical therapist's instructions. Perform all exercises about fifteen times, three times per day or as directed. Exercise both hips, even if you have had only one joint replacement. These exercises can be done on a training (exercise) mat, on the floor, on a table or on a bed. Use whatever works the best and is most comfortable for you. Use music or television while you are exercising so that the exercises are a pleasant break in your day. This will make your life better with the exercises acting as a break in routine you can look forward to.  Lying on your back, slowly slide your foot toward your buttocks, raising your knee up off the floor. Then slowly slide your foot back down until your leg is straight again.  Lying on your back spread your legs as far apart as you can without causing discomfort.  Lying on your side, raise your upper leg and foot straight  up from the floor as far as is comfortable. Slowly lower the leg and repeat.  Lying on your back, tighten up the muscle in the front of your thigh (quadriceps muscles). You can do this by keeping your leg straight and trying to raise your heel off the floor. This helps strengthen the largest muscle supporting your knee.  Lying on your back, tighten up the muscles of your buttocks both with the legs straight and with the knee bent at a comfortable angle while keeping your heel on the floor.   POST-OPERATIVE OPIOID TAPER INSTRUCTIONS: It is important to wean off of your opioid medication as soon as possible. If you do not need pain medication after your surgery it is ok to stop day one. Opioids include: Codeine, Hydrocodone(Norco, Vicodin), Oxycodone (Percocet, oxycontin ) and hydromorphone  amongst others.  Long term and even short term use of opiods can cause: Increased pain response Dependence Constipation Depression Respiratory depression And more.  Withdrawal symptoms can include Flu like symptoms Nausea, vomiting And more Techniques to manage these symptoms Hydrate well Eat regular healthy meals Stay active Use relaxation techniques(deep breathing, meditating, yoga) Do Not substitute Alcohol  to help with tapering If you have been on opioids for less than two weeks and do not have pain than it is ok to stop all together.  Plan to wean off of opioids This plan should start within one week post op of your joint replacement. Maintain the same interval or time between taking each dose and first decrease the dose.  Cut the total daily intake of opioids by one tablet each day Next start to increase the time between doses. The last dose that should be eliminated is the evening dose.   IF YOU ARE TRANSFERRED TO A SKILLED REHAB FACILITY If the patient is transferred to a skilled rehab facility following release from the hospital, a list of the current medications will be sent to the facility  for the patient to continue.  When discharged from the skilled rehab facility, please have the facility set up the patient's Home Health Physical Therapy prior to being released. Also, the skilled facility will be responsible for providing the patient with their medications at time of release from the facility to include their pain medication, the muscle relaxants, and their blood thinner medication. If the patient is still at the rehab facility at time of the two week follow up appointment, the skilled rehab facility will also need  to assist the patient in arranging follow up appointment in our office and any transportation needs.  MAKE SURE YOU:  Understand these instructions.  Get help right away if you are not doing well or get worse.    DENTAL ANTIBIOTICS:  In most cases prophylactic antibiotics for Dental procdeures after total joint surgery are not necessary.  Exceptions are as follows:  1. History of prior total joint infection  2. Severely immunocompromised (Organ Transplant, cancer chemotherapy, Rheumatoid biologic meds such as Humera)  3. Poorly controlled diabetes (A1C &gt; 8.0, blood glucose over 200)  If you have one of these conditions, contact your surgeon for an antibiotic prescription, prior to your dental procedure.    Pick up stool softner and laxative for home use following surgery while on pain medications. Do not submerge incision under water. Please use good hand washing techniques while changing dressing each day. May shower starting three days after surgery. Please use a clean towel to pat the incision dry following showers. Continue to use ice for pain and swelling after surgery. Do not use any lotions or creams on the incision until instructed by your surgeon.

## 2023-02-28 NOTE — Anesthesia Procedure Notes (Signed)
 Procedure Name: Intubation Date/Time: 02/28/2023 12:48 PM  Performed by: Mitchell Celestine BRAVO, CRNAPre-anesthesia Checklist: Patient identified, Patient being monitored, Timeout performed, Emergency Drugs available and Suction available Patient Re-evaluated:Patient Re-evaluated prior to induction Oxygen Delivery Method: Circle system utilized Preoxygenation: Pre-oxygenation with 100% oxygen Induction Type: IV induction Ventilation: Mask ventilation without difficulty Laryngoscope Size: Mac and 3 Grade View: Grade I Tube type: Oral Tube size: 7.5 mm Number of attempts: 1 Airway Equipment and Method: Stylet Placement Confirmation: ETT inserted through vocal cords under direct vision, positive ETCO2 and breath sounds checked- equal and bilateral Secured at: 23 cm Tube secured with: Tape Dental Injury: Teeth and Oropharynx as per pre-operative assessment

## 2023-02-28 NOTE — Care Plan (Signed)
 Ortho Bundle Case Management Note  Patient Details  Name: Jonathan Garner. MRN: [id number] Date of Birth: [birth year]                  R THA on 02-28-23  DCP: Home with wife  DME: RW ordered through [employer]  PT: HEP   DME Arranged:  Walker rolling DME Agency:  [employer]    Additional Comments: Please contact me with any questions of if this plan should need to change.   Kate DELENA Kraft, RN,CCM [facility]  [phone] 02/28/2023, 9:51 AM

## 2023-02-28 NOTE — Progress Notes (Signed)
 Orthopedic Tech Progress Note Patient Details:  Jonathan Garner 19-Nov-1945 [id number]  Patient ID: Jonathan LELON Kendall Mickey., male   DOB: [birth year], 78 y.o.   MRN: [id number] Patient doesn't meet criteria for ohf. Patient must be under 70 to get ohf. Chandra Dorn PARAS 02/28/2023, 11:11 PM

## 2023-02-28 NOTE — Anesthesia Postprocedure Evaluation (Signed)
 Anesthesia Post Note  Patient: Jonathan Garner.  Procedure(s) Performed: TOTAL HIP ARTHROPLASTY ANTERIOR APPROACH (Right: Hip)     Patient location during evaluation: PACU Anesthesia Type: General Level of consciousness: awake Pain management: pain level controlled Vital Signs Assessment: post-procedure vital signs reviewed and stable Respiratory status: spontaneous breathing, nonlabored ventilation and respiratory function stable Cardiovascular status: blood pressure returned to baseline and stable Postop Assessment: no apparent nausea or vomiting Anesthetic complications: no   No notable events documented.  Last Vitals:  Vitals:   02/28/23 1530 02/28/23 1556  BP: (!) 146/64 131/73  Pulse: 82 84  Resp: 19 15  Temp:    SpO2: 96% 96%    Last Pain:  Vitals:   02/28/23 1515  TempSrc:   PainSc: Asleep                 [name]

## 2023-02-28 NOTE — Interval H&P Note (Signed)
 History and Physical Interval Note:  02/28/2023 10:37 AM  Jonathan Garner.  has presented today for surgery, with the diagnosis of right hip osteoarthritis.  The various methods of treatment have been discussed with the patient and family. After consideration of risks, benefits and other options for treatment, the patient has consented to  Procedure(s): TOTAL HIP ARTHROPLASTY ANTERIOR APPROACH (Right) as a surgical intervention.  The patient's history has been reviewed, patient examined, no change in status, stable for surgery.  I have reviewed the patient's chart and labs.  Questions were answered to the patient's satisfaction.     [name]

## 2023-03-01 ENCOUNTER — Encounter (HOSPITAL_COMMUNITY): Payer: Self-pay | Admitting: Orthopedic Surgery

## 2023-03-01 DIAGNOSIS — M1611 Unilateral primary osteoarthritis, right hip: Secondary | ICD-10-CM | POA: Diagnosis not present

## 2023-03-01 LAB — CBC
HCT: 35.5 % — ABNORMAL LOW (ref 39.0–52.0)
Hemoglobin: 11.2 g/dL — ABNORMAL LOW (ref 13.0–17.0)
MCH: 27.7 pg (ref 26.0–34.0)
MCHC: 31.5 g/dL (ref 30.0–36.0)
MCV: 87.7 fL (ref 80.0–100.0)
Platelets: 178 10*3/uL (ref 150–400)
RBC: 4.05 MIL/uL — ABNORMAL LOW (ref 4.22–5.81)
RDW: 14.8 % (ref 11.5–15.5)
WBC: 11.7 10*3/uL — ABNORMAL HIGH (ref 4.0–10.5)
nRBC: 0 % (ref 0.0–0.2)

## 2023-03-01 LAB — BASIC METABOLIC PANEL
Anion gap: 8 (ref 5–15)
BUN: 24 mg/dL — ABNORMAL HIGH (ref 8–23)
CO2: 21 mmol/L — ABNORMAL LOW (ref 22–32)
Calcium: 8.3 mg/dL — ABNORMAL LOW (ref 8.9–10.3)
Chloride: 106 mmol/L (ref 98–111)
Creatinine, Ser: 1.1 mg/dL (ref 0.61–1.24)
GFR, Estimated: >60 mL/min (ref >60)
Glucose, Bld: 135 mg/dL — ABNORMAL HIGH (ref 70–99)
Potassium: 4.8 mmol/L (ref 3.5–5.1)
Sodium: 135 mmol/L (ref 135–145)

## 2023-03-01 MED ORDER — OXYCODONE HCL 5 MG PO TABS: 5.0000 mg | ORAL_TABLET | Freq: Three times a day (TID) | ORAL | 0 refills | Status: DC | PRN

## 2023-03-01 MED ORDER — METHOCARBAMOL 500 MG PO TABS: 500.0000 mg | ORAL_TABLET | Freq: Four times a day (QID) | ORAL | 0 refills | Status: DC | PRN

## 2023-03-01 MED ORDER — ONDANSETRON HCL 4 MG PO TABS: 4.0000 mg | ORAL_TABLET | Freq: Four times a day (QID) | ORAL | 0 refills | Status: DC | PRN

## 2023-03-01 MED ORDER — TRAMADOL HCL 50 MG PO TABS: 50.0000 mg | ORAL_TABLET | Freq: Three times a day (TID) | ORAL | 0 refills | Status: DC | PRN

## 2023-03-01 NOTE — TOC Transition Note (Signed)
 Transition of Care [facility]) - Discharge Note   Patient Details  Name: Jonathan Garner. MRN: [id number] Date of Birth: May 28, 1945  [facility] CM/SW Contact:  Alfonse JONELLE Rex, RN Phone Number: 03/01/2023, 10:40 AM   Clinical Narrative:  Met with patient and spouse at bedside, confirmed follow up therapy HEP, RW delivered to room by [employer]. No TOC needs       Final next level of care: Home/Self Care (HEP) Barriers to Discharge: No Barriers Identified   Patient Goals and CMS Choice Patient states their goals for this hospitalization and ongoing recovery are:: return home          Discharge Placement                       Discharge Plan and Services Additional resources added to the After Visit Summary for                  DME Arranged: Walker rolling DME Agency: [employer]                  Social Drivers of Health (SDOH) Interventions SDOH Screenings   Food Insecurity: No Food Insecurity (02/28/2023)  Housing: Low Risk  (02/28/2023)  Transportation Needs: No Transportation Needs (02/28/2023)  Utilities: Not At Risk (02/28/2023)  Depression (PHQ2-9): Low Risk  (01/26/2022)  Tobacco Use: Medium Risk (02/28/2023)     Readmission Risk Interventions     No data to display

## 2023-03-01 NOTE — Evaluation (Signed)
 Physical Therapy Evaluation Patient Details Name: Jonathan Garner. MRN: [id number] DOB: 12-17-1945 Today's Date: 03/01/2023  History of Present Illness  78 y.o. male admitted 02/28/23 for R AA THA. PMH: gout, AAA, CHF, COPD, MI, spinal stenosis  Clinical Impression  Pt is mobilizing well, he ambulated 150' with RW, no loss of balance. Stair training completed. He demonstrates good understanding of HEP. He is ready to DC home from a PT standpoint.          If plan is discharge home, recommend the following: A little help with bathing/dressing/bathroom;Assist for transportation;Help with stairs or ramp for entrance   Can travel by private vehicle        Equipment Recommendations Rolling walker (2 wheels)  Recommendations for Other Services       Functional Status Assessment Patient has had a recent decline in their functional status and demonstrates the ability to make significant improvements in function in a reasonable and predictable amount of time.     Precautions / Restrictions Precautions Precautions: Fall Precaution Comments: 2 falls in past 6 months (hunting in the woods), pt attributes this to numb feet (neuropathy) Restrictions Weight Bearing Restrictions Per Provider Order: No      Mobility  Bed Mobility Overal bed mobility: Modified Independent             General bed mobility comments: HOB up, used rail    Transfers Overall transfer level: Needs assistance Equipment used: Rolling walker (2 wheels) Transfers: Sit to/from Stand Sit to Stand: Contact guard assist           General transfer comment: VCs hand placement    Ambulation/Gait Ambulation/Gait assistance: Supervision Gait Distance (Feet): 150 Feet Assistive device: Rolling walker (2 wheels) Gait Pattern/deviations: Step-through pattern, Decreased step length - right, Decreased step length - left Gait velocity: decr     General Gait Details: steady, no loss of  balance  Stairs Stairs: Yes Stairs assistance: Min assist Stair Management: Forwards, Two rails, Step to pattern Number of Stairs: 3 General stair comments: 2 steps backwards with no rails with wife steadying RW and VCs for sequencing; then 3 rails forwards with B rails  Wheelchair Mobility     Tilt Bed    Modified Rankin (Stroke Patients Only)       Balance Overall balance assessment: Modified Independent                                           Pertinent Vitals/Pain Pain Assessment Pain Assessment: 0-10 Pain Score: 3  Pain Location: R hip Pain Descriptors / Indicators: Sore Pain Intervention(s): Limited activity within patient's tolerance, Monitored during session, Premedicated before session, Ice applied    Home Living Family/patient expects to be discharged to:: Private residence Living Arrangements: Spouse/significant other     Home Access: Stairs to enter Entrance Stairs-Rails: None Entrance Stairs-Number of Steps: 2 Alternate Level Stairs-Number of Steps: 6 + 8 Home Layout: Multi-level Home Equipment: Cane - single point      Prior Function Prior Level of Function : Independent/Modified Independent;Driving             Mobility Comments: walked with [facility] ADLs Comments: needed help with socks, has a sock aid     Extremity/Trunk Assessment   Upper Extremity Assessment Upper Extremity Assessment: Overall WFL for tasks assessed    Lower Extremity Assessment Lower Extremity Assessment: RLE deficits/detail;LLE  deficits/detail RLE Deficits / Details: hip AAROM ~ 45* flexion, ~15* abduction, knee ext at least 3/5 RLE Sensation: history of peripheral neuropathy;decreased light touch LLE Sensation: history of peripheral neuropathy    Cervical / Trunk Assessment Cervical / Trunk Assessment: Normal  Communication   Communication Communication: Hearing impairment  Cognition Arousal: Alert Behavior During Therapy: WFL for tasks  assessed/performed Overall Cognitive Status: Within Functional Limits for tasks assessed                                          General Comments      Exercises Total Joint Exercises Ankle Circles/Pumps: AROM, Both, 10 reps, Supine Quad Sets: AROM, Both, 5 reps, Supine Short Arc Quad: AROM, Right, 5 reps, Supine Heel Slides: AAROM, Right, 10 reps, Supine Hip ABduction/ADduction: AAROM, Right, 10 reps, Supine Long Arc Quad: AROM, Right, 5 reps, Seated   Assessment/Plan    PT Assessment Patient does not need any further PT services  PT Problem List         PT Treatment Interventions      PT Goals (Current goals can be found in the Care Plan section)  Acute Rehab PT Goals Patient Stated Goal: hunting PT Goal Formulation: All assessment and education complete, DC therapy    Frequency       Co-evaluation               AM-PAC PT 6 Clicks Mobility  Outcome Measure Help needed turning from your back to your side while in a flat bed without using bedrails?: None Help needed moving from lying on your back to sitting on the side of a flat bed without using bedrails?: None Help needed moving to and from a bed to a chair (including a wheelchair)?: None Help needed standing up from a chair using your arms (e.g., wheelchair or bedside chair)?: None Help needed to walk in hospital room?: None Help needed climbing 3-5 steps with a railing? : A Little 6 Click Score: 23    End of Session Equipment Utilized During Treatment: Gait belt Activity Tolerance: Patient tolerated treatment well Patient left: in chair;with call bell/phone within reach;with chair alarm set;with family/visitor present Nurse Communication: Mobility status      Time: 8954-8879 PT Time Calculation (min) (ACUTE ONLY): 35 min   Charges:   PT Evaluation $PT Eval Moderate Complexity: 1 Mod PT Treatments $Gait Training: 8-22 mins PT General Charges $$ ACUTE PT VISIT: 1 Visit          Sylvan Delon Copp PT 03/01/2023  Acute Rehabilitation Services  Office [phone]

## 2023-03-01 NOTE — Care Management Obs Status (Signed)
 MEDICARE OBSERVATION STATUS NOTIFICATION   Patient Details  Name: Jonathan Garner. MRN: [id number] Date of Birth: 1945/06/24   Medicare Observation Status Notification Given:       Howell Rucks, RN 03/01/2023, 10:03 AM

## 2023-03-01 NOTE — Discharge Summary (Signed)
 Physician Discharge Summary   Patient ID: Jonathan Garner. MRN: [id number] DOB/AGE: 04/15/1945 78 y.o.  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   Weight as of this encounter: 109.8 kg.  Procedure:  Procedure(s) (LRB): TOTAL HIP ARTHROPLASTY ANTERIOR APPROACH (Right)   Consults: None  HPI: Jonathan Garner. is a 78 y.o. male who has advanced end-stage arthritis of their Right  hip with progressively worsening pain and dysfunction.The patient has failed nonoperative management and presents for total hip arthroplasty.  Laboratory Data: Admission on 02/28/2023, Discharged on 03/01/2023  Component Date Value Ref Range Status   WBC 03/01/2023 11.7 (H)  4.0 - 10.5 K/uL Final   RBC 03/01/2023 4.05 (L)  4.22 - 5.81 MIL/uL Final   Hemoglobin 03/01/2023 11.2 (L)  13.0 - 17.0 g/dL Final   HCT 98/85/7974 35.5 (L)  39.0 - 52.0 % Final   MCV 03/01/2023 87.7  80.0 - 100.0 fL Final   MCH 03/01/2023 27.7  26.0 - 34.0 pg Final   MCHC 03/01/2023 31.5  30.0 - 36.0 g/dL Final   RDW 98/85/7974 14.8   11.5 - 15.5 % Final   Platelets 03/01/2023 178  150 - 400 K/uL Final   nRBC 03/01/2023 0.0  0.0 - 0.2 % Final   Performed at [facility], [address], [place], [place] [address]   Sodium 03/01/2023 135  135 - 145 mmol/L Final   Potassium 03/01/2023 4.8  3.5 - 5.1 mmol/L Final   Chloride 03/01/2023 106  98 - 111 mmol/L Final   CO2 03/01/2023 21 (L)  22 - 32 mmol/L Final   Glucose, Bld 03/01/2023 135 (H)  70 - 99 mg/dL Final   Glucose reference range applies only to samples taken after fasting for at least 8 hours.   BUN 03/01/2023 24 (H)  8 - 23 mg/dL Final   Creatinine, Ser 03/01/2023 1.10  0.61 - 1.24 mg/dL Final   Calcium  03/01/2023 8.3 (L)  8.9 - 10.3 mg/dL Final   GFR, Estimated 03/01/2023 >60  >60 mL/min Final   Comment: (NOTE) Calculated using the CKD-EPI Creatinine Equation (2021)    Anion gap 03/01/2023 8  5 - 15 Final   Performed at [facility], [address] [place], [place] [address]  Hospital Outpatient Visit on 02/17/2023  Component Date Value Ref Range Status   MRSA, PCR 02/17/2023 NEGATIVE  NEGATIVE Final  Staphylococcus aureus 02/17/2023 NEGATIVE  NEGATIVE Final   Comment: (NOTE) The Xpert SA Assay (FDA approved for NASAL specimens in patients 50 years of age and older), is one component of a comprehensive surveillance program. It is not intended to diagnose infection nor to guide or monitor treatment. Performed at [facility], [address] [place], [place] [address]    Sodium 02/17/2023 136  135 - 145 mmol/L Final   Potassium 02/17/2023 4.9  3.5 - 5.1 mmol/L Final   Chloride 02/17/2023 106  98 - 111 mmol/L Final   CO2 02/17/2023 21 (L)  22 - 32 mmol/L Final   Glucose, Bld 02/17/2023 99  70 - 99 mg/dL Final   Glucose reference range applies only to samples taken after fasting for at least 8 hours.   BUN 02/17/2023 30 (H)  8 - 23 mg/dL Final   Creatinine, Ser 02/17/2023 1.53 (H)  0.61 - 1.24  mg/dL Final   Calcium  02/17/2023 9.1  8.9 - 10.3 mg/dL Final   GFR, Estimated 02/17/2023 46 (L)  >60 mL/min Final   Comment: (NOTE) Calculated using the CKD-EPI Creatinine Equation (2021)    Anion gap 02/17/2023 9  5 - 15 Final   Performed at [facility], [address], [place], [place] [address]   WBC 02/17/2023 7.2  4.0 - 10.5 K/uL Final   RBC 02/17/2023 4.48  4.22 - 5.81 MIL/uL Final   Hemoglobin 02/17/2023 12.5 (L)  13.0 - 17.0 g/dL Final   HCT 98/97/7974 40.2  39.0 - 52.0 % Final   MCV 02/17/2023 89.7  80.0 - 100.0 fL Final   MCH 02/17/2023 27.9  26.0 - 34.0 pg Final   MCHC 02/17/2023 31.1  30.0 - 36.0 g/dL Final   RDW 98/97/7974 15.2  11.5 - 15.5 % Final   Platelets 02/17/2023 253  150 - 400 K/uL Final   nRBC 02/17/2023 0.0  0.0 - 0.2 % Final   Performed at [facility], [address] [place], [place] [address]   ABO/RH(D) 02/17/2023 O POS   Final   Antibody Screen 02/17/2023 NEG   Final   Sample Expiration 02/17/2023 03/03/2023,2359   Final   Extend sample reason 02/17/2023    Final                   Value:NO TRANSFUSIONS OR PREGNANCY IN THE PAST 3 MONTHS Performed at [facility], [address] [place], [place] [address]      X-Rays:DG Pelvis Portable Result Date: 02/28/2023 CLINICAL DATA:  Right hip replacement. EXAM: PORTABLE PELVIS 1-2 VIEWS COMPARISON:  None Available. FINDINGS: Right hip arthroplasty in expected alignment. No periprosthetic lucency or fracture. Recent postsurgical change includes air and edema in the soft tissues. IMPRESSION: Right hip arthroplasty without immediate postoperative complication. Electronically Signed   By: Andrea Gasman M.D.   On: 02/28/2023 15:02   DG HIP UNILAT WITH PELVIS 1V RIGHT Result Date: 02/28/2023 CLINICAL DATA:  Right hip arthroplasty. EXAM: DG HIP (WITH OR WITHOUT PELVIS) 1V RIGHT COMPARISON:  None Available. FINDINGS: Two fluoroscopic spot views of the pelvis and  right hip obtained in the operating room. Sequential images during hip arthroplasty. Fluoroscopy time 42 seconds. Dose 1.648 mGy. IMPRESSION: Intraoperative fluoroscopy during right hip arthroplasty. Electronically Signed   By: Andrea Gasman M.D.   On: 02/28/2023 14:57   DG C-Arm 1-60 Min-No Report Result Date: 02/28/2023 Fluoroscopy was utilized by the requesting physician.  No radiographic interpretation.  EKG: Orders placed or performed during the hospital encounter of 09/13/22   EKG 12-Lead   EKG 12-Lead     Hospital Course: Mostafa Yuan. is a 78 y.o. who was admitted to [facility]. They were brought to the operating room on 02/28/2023 and underwent Procedure(s): TOTAL HIP ARTHROPLASTY ANTERIOR APPROACH.  Patient tolerated the procedure well and was later transferred to the recovery room and then to the orthopaedic floor for postoperative care. They were given PO and IV analgesics for pain control following their surgery. They were given 24 hours of postoperative antibiotics of  Anti-infectives (From admission, onward)    Start     Dose/Rate Route Frequency Ordered Stop   02/28/23 1840  ceFAZolin  (ANCEF ) IVPB 2g/100 mL premix        2 g 200 mL/hr over 30 Minutes Intravenous Every 6 hours 02/28/23 1544 03/01/23 0044   02/28/23 1000  ceFAZolin  (ANCEF ) IVPB 2g/100 mL premix        2 g 200 mL/hr over 30 Minutes Intravenous On call to O.R. 02/28/23 0950 02/28/23 1310     and started on DVT prophylaxis in the form of  Plavix  + Eliquis  .   PT and OT were ordered for total joint protocol. Discharge planning consulted to help with postop disposition and equipment needs.  Patient had a good night on the evening of surgery. Patient was seen during rounds and was ready to go home pending progress with therapy. He worked with therapy on POD #1 and was meeting his goals. Pt was discharged to home later that day in stable condition.  Diet: Regular diet Activity: WBAT Follow-up:  in 2 weeks Disposition: Home Discharged Condition: stable   Discharge Instructions     Call MD / Call 911   Complete by: As directed    If you experience chest pain or shortness of breath, CALL 911 and be transported to the hospital emergency room.  If you develope a fever above 101 F, pus (white drainage) or increased drainage or redness at the wound, or calf pain, call your surgeon's office.   Change dressing   Complete by: As directed    You have an adhesive waterproof bandage over the incision. Leave this in place until your first follow-up appointment. Once you remove this you will not need to place another bandage.   Constipation Prevention   Complete by: As directed    Drink plenty of fluids.  Prune juice may be helpful.  You may use a stool softener, such as Colace (over the counter) 100 mg twice a day.  Use MiraLax  (over the counter) for constipation as needed.   Diet - low sodium heart healthy   Complete by: As directed    Do not sit on low chairs, stoools or toilet seats, as it may be difficult to get up from low surfaces   Complete by: As directed    Driving restrictions   Complete by: As directed    No driving for two weeks   Post-operative opioid taper instructions:   Complete by: As directed    POST-OPERATIVE OPIOID TAPER INSTRUCTIONS: It is important to wean off of your opioid medication as soon as possible. If you do not need pain medication after your surgery it is ok to stop day one. Opioids include: Codeine, Hydrocodone(Norco, Vicodin), Oxycodone (Percocet, oxycontin ) and hydromorphone  amongst others.  Long term and even short term use of opiods can cause: Increased pain response Dependence Constipation Depression Respiratory depression And more.  Withdrawal symptoms can include Flu like symptoms Nausea, vomiting And more Techniques to manage these symptoms Hydrate well Eat regular healthy meals Stay active Use relaxation techniques(deep breathing,  meditating, yoga) Do Not substitute Alcohol  to help with tapering If you have been on opioids for less than two weeks and do not have pain than it is ok to stop all together.  Plan to wean off of opioids This plan should start within one week post op of your joint replacement. Maintain the same interval or time between taking each dose and first decrease the dose.  Cut the total daily intake of opioids by one tablet each day Next start to increase the time between doses. The last dose that should be eliminated is the evening dose.      TED hose   Complete by: As directed    Use stockings (TED hose) for three weeks on both leg(s).  You may remove them at night for sleeping.   Weight bearing as tolerated   Complete by: As directed       Allergies as of 03/01/2023       Reactions   Atorvastatin     joint pain        Medication List     STOP taking these medications    HYDROcodone-acetaminophen  5-325 MG tablet Commonly known as: NORCO/VICODIN       TAKE these medications    acetaminophen  500 MG tablet Commonly known as: TYLENOL  Take 500 mg by mouth every 8 (eight) hours as needed for moderate pain.   apixaban  5 MG Tabs tablet Commonly known as: Eliquis  Take 1 tablet (5 mg total) by mouth 2 (two) times daily.   clopidogrel  75 MG tablet Commonly known as: PLAVIX  Take 1 tablet by mouth once daily   [employer] Vitamin B-12 5000 MCG Subl Generic drug: Cyanocobalamin  Place 5,000 mcg under the tongue daily.   Entresto  24-26 MG Generic drug: sacubitril -valsartan  Take 1 tablet by mouth twice daily   Jardiance  10 MG Tabs tablet Generic drug: empagliflozin  Take 1 tablet by mouth once daily   methocarbamol  500 MG tablet Commonly known as: ROBAXIN  Take 1 tablet (500 mg total) by mouth every 6 (six) hours as needed for muscle spasms.   metoprolol  succinate 25 MG 24 hr tablet Commonly known as: Toprol  XL Take 1 tablet (25 mg total) by mouth at bedtime.   nitroGLYCERIN  0.4  MG SL tablet Commonly known as: NITROSTAT  Place 1 tablet (0.4 mg total) under the tongue every 5 (five) minutes x 3 doses as needed for chest pain.   ondansetron  4 MG tablet Commonly known as: ZOFRAN  Take 1 tablet (4 mg total) by mouth every 6 (six) hours as needed for nausea.   oxyCODONE  5 MG immediate release tablet Commonly known as: Oxy IR/ROXICODONE  Take 1-2 tablets (5-10 mg total) by mouth every 8 (eight) hours as needed for severe pain (pain score 7-10).   potassium chloride  SA 20 MEQ tablet Commonly known as: Klor-Con  M20 Take 1 tablet (20 mEq total) by mouth daily.   spironolactone  25 MG tablet Commonly known as: ALDACTONE  Take 1/2 (one-half) tablet by mouth once daily   torsemide  20 MG tablet Commonly known as: DEMADEX  Take 1 tablet by mouth once daily   traMADol  50 MG tablet Commonly known as: ULTRAM  Take 1 tablet (50 mg total) by mouth every 8 (eight) hours as needed for moderate pain (pain score 4-6).               Discharge Care  Instructions  (From admission, onward)           Start     Ordered   03/01/23 0000  Weight bearing as tolerated        03/01/23 0755   03/01/23 0000  Change dressing       Comments: You have an adhesive waterproof bandage over the incision. Leave this in place until your first follow-up appointment. Once you remove this you will not need to place another bandage.   03/01/23 0755            Follow-up Information     Melodi Lerner, MD. Go on 03/16/2023.   Specialty: Orthopedic Surgery Why: You are scheduled for first post op appt on Wednesday January 29 at 3:00pm. Contact information: [address] [place] [address] [phone]                 Signed: R. Zelda Kobs, PA-C Orthopedic Surgery 03/01/2023, 12:59 PM

## 2023-03-01 NOTE — Progress Notes (Signed)
 Patient stood at side of bed at hs. Tolerated well.

## 2023-03-01 NOTE — Progress Notes (Signed)
   Subjective: 1 Day Post-Op Procedure(s) (LRB): TOTAL HIP ARTHROPLASTY ANTERIOR APPROACH (Right) Patient seen in rounds by Dr. Melodi. Patient is well, and has had no acute complaints or problems. Denies SOB or chest pain. Denies calf pain. Patient reports pain as moderate. We will start physical therapy today.   Objective: Vital signs in last 24 hours: Temp:  [97.7 F (36.5 C)-98.6 F (37 C)] 98.5 F (36.9 C) (01/14 0615) Pulse Rate:  [78-95] 86 (01/14 0615) Resp:  [12-19] 18 (01/14 0615) BP: (109-159)/(59-81) 117/59 (01/14 0615) SpO2:  [93 %-99 %] 93 % (01/14 0615) Weight:  [109.8 kg] 109.8 kg (01/13 1100)  Intake/Output from previous day:  Intake/Output Summary (Last 24 hours) at 03/01/2023 0752 Last data filed at 03/01/2023 0615 Gross per 24 hour  Intake 1801 ml  Output 1175 ml  Net 626 ml     Intake/Output this shift: No intake/output data recorded.  Labs: Recent Labs    03/01/23 0338  HGB 11.2*   Recent Labs    03/01/23 0338  WBC 11.7*  RBC 4.05*  HCT 35.5*  PLT 178   Recent Labs    03/01/23 0338  NA 135  K 4.8  CL 106  CO2 21*  BUN 24*  CREATININE 1.10  GLUCOSE 135*  CALCIUM  8.3*   No results for input(s): LABPT, INR in the last 72 hours.  Exam: General - Patient is Alert and Oriented Extremity - Neurologically intact Neurovascular intact Sensation intact distally Dorsiflexion/Plantar flexion intact Dressing - dressing C/D/I Motor Function - intact, moving foot and toes well on exam.  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   Weight as of this encounter: 109.8 kg. Advance diet Up with therapy D/C IV fluids  DVT Prophylaxis -  Plavix  + Eliquis  Weight bearing as tolerated.  Start physical therapy. Expected discharge home today pending progress with physical therapy and if meeting patient goals. Will do HEP once discharged. Follow-up in clinic in 2 weeks.  The PDMP database was reviewed today prior to any opioid medications being prescribed to this patient.  R. Zelda Kobs, PA-C Orthopedic Surgery [phone] 03/01/2023, 7:52 AM

## 2023-03-07 ENCOUNTER — Ambulatory Visit (INDEPENDENT_AMBULATORY_CARE_PROVIDER_SITE_OTHER): Payer: PPO

## 2023-03-07 DIAGNOSIS — I442 Atrioventricular block, complete: Secondary | ICD-10-CM | POA: Diagnosis not present

## 2023-03-07 LAB — CUP PACEART REMOTE DEVICE CHECK
Battery Remaining Longevity: 123 mo
Battery Voltage: 3.02 V
Brady Statistic AP VP Percent: 2.18 %
Brady Statistic AP VS Percent: 0 %
Brady Statistic AS VP Percent: 97.78 %
Brady Statistic AS VS Percent: 0.04 %
Brady Statistic RA Percent Paced: 2.18 %
Brady Statistic RV Percent Paced: 99.96 %
Date Time Interrogation Session: 20250119195810
Implantable Lead Connection Status: 753985
Implantable Lead Connection Status: 753985
Implantable Lead Implant Date: 20221014
Implantable Lead Implant Date: 20221014
Implantable Lead Location: 753859
Implantable Lead Location: 753860
Implantable Lead Model: 3830
Implantable Lead Model: 5076
Implantable Pulse Generator Implant Date: 20221014
Lead Channel Impedance Value: 342 Ohm
Lead Channel Impedance Value: 361 Ohm
Lead Channel Impedance Value: 418 Ohm
Lead Channel Impedance Value: 475 Ohm
Lead Channel Pacing Threshold Amplitude: 0.75 V
Lead Channel Pacing Threshold Amplitude: 1 V
Lead Channel Pacing Threshold Pulse Width: 0.4 ms
Lead Channel Pacing Threshold Pulse Width: 0.4 ms
Lead Channel Sensing Intrinsic Amplitude: 21.75 mV
Lead Channel Sensing Intrinsic Amplitude: 21.75 mV
Lead Channel Sensing Intrinsic Amplitude: 6 mV
Lead Channel Sensing Intrinsic Amplitude: 6 mV
Lead Channel Setting Pacing Amplitude: 1.5 V
Lead Channel Setting Pacing Amplitude: 2 V
Lead Channel Setting Pacing Pulse Width: 0.4 ms
Lead Channel Setting Sensing Sensitivity: 0.9 mV
Zone Setting Status: 755011
Zone Setting Status: 755011

## 2023-03-21 ENCOUNTER — Telehealth: Payer: Self-pay | Admitting: Cardiology

## 2023-03-21 NOTE — Telephone Encounter (Signed)
Left message to return call.  Apt with advance heart failure Dr.Sabharwal is 2/21 Apt with Dr.Branch is 2/27

## 2023-03-21 NOTE — Telephone Encounter (Signed)
.     Pt c/o Shortness Of Breath: STAT if SOB developed within the last 24 hours or pt is noticeably SOB on the phone  1. Are you currently SOB (can you hear that pt is SOB on the phone)? no  2. How long have you been experiencing SOB? 2/3 days   3. Are you SOB when sitting or when up moving around? both  4. Are you currently experiencing any other symptoms? Patient states that he can be sitting or laying down and its very hard to breathing. Marland Kitchen

## 2023-03-21 NOTE — Telephone Encounter (Signed)
This message was from another phone call today:   Pt states that he is currently on pain pills due to recent hip surgery and does not know if that is what is causing the SOB       I spoke with patient and he tells me he stopped his torsemide for at least 3 days before he became SOB.He stopped it because he was away from home and did not like the fact it sends him to the restroom.His weight is 230 lbs. He took his torsemide today and his SOB is now better   I will forward to Dr.Branch

## 2023-03-21 NOTE — Telephone Encounter (Signed)
Pt c/o Shortness Of Breath: STAT if SOB developed within the last 24 hours or pt is noticeably SOB on the phone  1. Are you currently SOB (can you hear that pt is SOB on the phone)? No  2. How long have you been experiencing SOB? 3 days  3. Are you SOB when sitting or when up moving around? Both  4. Are you currently experiencing any other symptoms? No  Pt states that he is currently on pain pills due to recent hip surgery and does not know if that is what is causing the SOB

## 2023-03-21 NOTE — Telephone Encounter (Signed)
I spoke to Dr.Branch and he wanted patient to resume taking his torsemide daily and to not skip doses. Patient agrees with plan.

## 2023-03-21 NOTE — Telephone Encounter (Signed)
See previous phone note from today at 10:20 am

## 2023-03-22 ENCOUNTER — Emergency Department (HOSPITAL_COMMUNITY): Payer: PPO

## 2023-03-22 ENCOUNTER — Other Ambulatory Visit: Payer: Self-pay

## 2023-03-22 ENCOUNTER — Encounter (HOSPITAL_COMMUNITY): Payer: Self-pay

## 2023-03-22 ENCOUNTER — Observation Stay (HOSPITAL_COMMUNITY)
Admission: EM | Admit: 2023-03-22 | Discharge: 2023-03-25 | Disposition: A | Discharge status: Home / Self Care | Payer: PPO | Attending: Internal Medicine | Admitting: Internal Medicine

## 2023-03-22 DIAGNOSIS — I130 Hypertensive heart and chronic kidney disease with heart failure and stage 1 through stage 4 chronic kidney disease, or unspecified chronic kidney disease: Secondary | ICD-10-CM | POA: Diagnosis not present

## 2023-03-22 DIAGNOSIS — Z87891 Personal history of nicotine dependence: Secondary | ICD-10-CM | POA: Diagnosis not present

## 2023-03-22 DIAGNOSIS — I10 Essential (primary) hypertension: Secondary | ICD-10-CM | POA: Diagnosis present

## 2023-03-22 DIAGNOSIS — R0609 Other forms of dyspnea: Secondary | ICD-10-CM | POA: Diagnosis present

## 2023-03-22 DIAGNOSIS — I2510 Atherosclerotic heart disease of native coronary artery without angina pectoris: Secondary | ICD-10-CM | POA: Diagnosis present

## 2023-03-22 DIAGNOSIS — D500 Iron deficiency anemia secondary to blood loss (chronic): Secondary | ICD-10-CM | POA: Diagnosis not present

## 2023-03-22 DIAGNOSIS — Z79899 Other long term (current) drug therapy: Secondary | ICD-10-CM | POA: Diagnosis not present

## 2023-03-22 DIAGNOSIS — Z1152 Encounter for screening for COVID-19: Secondary | ICD-10-CM | POA: Diagnosis not present

## 2023-03-22 DIAGNOSIS — R0602 Shortness of breath: Secondary | ICD-10-CM | POA: Diagnosis present

## 2023-03-22 DIAGNOSIS — Z950 Presence of cardiac pacemaker: Secondary | ICD-10-CM | POA: Diagnosis not present

## 2023-03-22 DIAGNOSIS — N1832 Chronic kidney disease, stage 3b: Secondary | ICD-10-CM | POA: Insufficient documentation

## 2023-03-22 DIAGNOSIS — Z6837 Body mass index (BMI) 37.0-37.9, adult: Secondary | ICD-10-CM | POA: Insufficient documentation

## 2023-03-22 DIAGNOSIS — Z7984 Long term (current) use of oral hypoglycemic drugs: Secondary | ICD-10-CM | POA: Diagnosis not present

## 2023-03-22 DIAGNOSIS — I442 Atrioventricular block, complete: Secondary | ICD-10-CM | POA: Diagnosis present

## 2023-03-22 DIAGNOSIS — Z96641 Presence of right artificial hip joint: Secondary | ICD-10-CM | POA: Insufficient documentation

## 2023-03-22 DIAGNOSIS — Z7902 Long term (current) use of antithrombotics/antiplatelets: Secondary | ICD-10-CM | POA: Diagnosis not present

## 2023-03-22 DIAGNOSIS — I5032 Chronic diastolic (congestive) heart failure: Secondary | ICD-10-CM | POA: Diagnosis present

## 2023-03-22 DIAGNOSIS — I480 Paroxysmal atrial fibrillation: Secondary | ICD-10-CM | POA: Insufficient documentation

## 2023-03-22 DIAGNOSIS — N179 Acute kidney failure, unspecified: Principal | ICD-10-CM | POA: Diagnosis present

## 2023-03-22 DIAGNOSIS — J449 Chronic obstructive pulmonary disease, unspecified: Secondary | ICD-10-CM | POA: Diagnosis present

## 2023-03-22 DIAGNOSIS — E872 Acidosis, unspecified: Secondary | ICD-10-CM | POA: Diagnosis present

## 2023-03-22 DIAGNOSIS — I2585 Chronic coronary microvascular dysfunction: Secondary | ICD-10-CM

## 2023-03-22 DIAGNOSIS — I5042 Chronic combined systolic (congestive) and diastolic (congestive) heart failure: Secondary | ICD-10-CM | POA: Diagnosis not present

## 2023-03-22 DIAGNOSIS — Z7901 Long term (current) use of anticoagulants: Secondary | ICD-10-CM | POA: Diagnosis not present

## 2023-03-22 DIAGNOSIS — G4733 Obstructive sleep apnea (adult) (pediatric): Secondary | ICD-10-CM

## 2023-03-22 LAB — COMPREHENSIVE METABOLIC PANEL
ALT: 18 U/L (ref 0–44)
AST: 26 U/L (ref 15–41)
Albumin: 4 g/dL (ref 3.5–5.0)
Alkaline Phosphatase: 140 U/L — ABNORMAL HIGH (ref 38–126)
Anion gap: 17 — ABNORMAL HIGH (ref 5–15)
BUN: 31 mg/dL — ABNORMAL HIGH (ref 8–23)
CO2: 19 mmol/L — ABNORMAL LOW (ref 22–32)
Calcium: 9.7 mg/dL (ref 8.9–10.3)
Chloride: 103 mmol/L (ref 98–111)
Creatinine, Ser: 1.87 mg/dL — ABNORMAL HIGH (ref 0.61–1.24)
GFR, Estimated: 36 mL/min — ABNORMAL LOW (ref >60)
Glucose, Bld: 117 mg/dL — ABNORMAL HIGH (ref 70–99)
Potassium: 4 mmol/L (ref 3.5–5.1)
Sodium: 139 mmol/L (ref 135–145)
Total Bilirubin: 0.6 mg/dL (ref 0.0–1.2)
Total Protein: 7.7 g/dL (ref 6.5–8.1)

## 2023-03-22 LAB — I-STAT VENOUS BLOOD GAS, ED
Acid-base deficit: 3 mmol/L — ABNORMAL HIGH (ref 0.0–2.0)
Bicarbonate: 22 mmol/L (ref 20.0–28.0)
Calcium, Ion: 1.16 mmol/L (ref 1.15–1.40)
HCT: 33 % — ABNORMAL LOW (ref 39.0–52.0)
Hemoglobin: 11.2 g/dL — ABNORMAL LOW (ref 13.0–17.0)
O2 Saturation: 95 %
Potassium: 3.9 mmol/L (ref 3.5–5.1)
Sodium: 139 mmol/L (ref 135–145)
TCO2: 23 mmol/L (ref 22–32)
pCO2, Ven: 40.4 mm[Hg] — ABNORMAL LOW (ref 44–60)
pH, Ven: 7.345 (ref 7.25–7.43)
pO2, Ven: 82 mm[Hg] — ABNORMAL HIGH (ref 32–45)

## 2023-03-22 LAB — CBC WITH DIFFERENTIAL/PLATELET
Abs Immature Granulocytes: 0.02 10*3/uL (ref 0.00–0.07)
Basophils Absolute: 0 10*3/uL (ref 0.0–0.1)
Basophils Relative: 1 %
Eosinophils Absolute: 0.1 10*3/uL (ref 0.0–0.5)
Eosinophils Relative: 1 %
HCT: 39.8 % (ref 39.0–52.0)
Hemoglobin: 12.4 g/dL — ABNORMAL LOW (ref 13.0–17.0)
Immature Granulocytes: 0 %
Lymphocytes Relative: 21 %
Lymphs Abs: 1.8 10*3/uL (ref 0.7–4.0)
MCH: 27 pg (ref 26.0–34.0)
MCHC: 31.2 g/dL (ref 30.0–36.0)
MCV: 86.7 fL (ref 80.0–100.0)
Monocytes Absolute: 1 10*3/uL (ref 0.1–1.0)
Monocytes Relative: 12 %
Neutro Abs: 5.6 10*3/uL (ref 1.7–7.7)
Neutrophils Relative %: 65 %
Platelets: 421 10*3/uL — ABNORMAL HIGH (ref 150–400)
RBC: 4.59 MIL/uL (ref 4.22–5.81)
RDW: 14.7 % (ref 11.5–15.5)
WBC: 8.5 10*3/uL (ref 4.0–10.5)
nRBC: 0 % (ref 0.0–0.2)

## 2023-03-22 LAB — TYPE AND SCREEN
ABO/RH(D): O POS
Antibody Screen: NEGATIVE

## 2023-03-22 LAB — I-STAT CG4 LACTIC ACID, ED: Lactic Acid, Venous: 1.7 mmol/L (ref 0.5–1.9)

## 2023-03-22 LAB — RESP PANEL BY RT-PCR (RSV, FLU A&B, COVID)  RVPGX2
Influenza A by PCR: NEGATIVE
Influenza B by PCR: NEGATIVE
Resp Syncytial Virus by PCR: NEGATIVE
SARS Coronavirus 2 by RT PCR: NEGATIVE

## 2023-03-22 LAB — TROPONIN I (HIGH SENSITIVITY)
Troponin I (High Sensitivity): 23 ng/L — ABNORMAL HIGH (ref <18)
Troponin I (High Sensitivity): 28 ng/L — ABNORMAL HIGH (ref <18)

## 2023-03-22 LAB — APTT: aPTT: 35 s (ref 24–36)

## 2023-03-22 LAB — BRAIN NATRIURETIC PEPTIDE: B Natriuretic Peptide: 60.3 pg/mL (ref 0.0–100.0)

## 2023-03-22 MED ORDER — IOHEXOL 350 MG/ML SOLN
85.0000 mL | Freq: Once | INTRAVENOUS | Status: AC | PRN
  Administered 2023-03-22: 85 mL via INTRAVENOUS

## 2023-03-22 MED ORDER — SODIUM CHLORIDE 0.9 % IV BOLUS
1000.0000 mL | Freq: Once | INTRAVENOUS | Status: AC
  Administered 2023-03-22: 1000 mL via INTRAVENOUS

## 2023-03-22 NOTE — ED Triage Notes (Addendum)
 Pt came to ED for Jonathan Garner that has worsened over the last 3 days. Pt has pacemaker/stents/ hx of abd aneurysm. Pt had hip replacement 4 weeks ago. Denies chest pain. Pt states swelling in legs but went away with water pills. Hx of CHF. C/O black bowels in the last 3 days.

## 2023-03-22 NOTE — Assessment & Plan Note (Addendum)
 Chronic order CPAP

## 2023-03-22 NOTE — ED Provider Notes (Signed)
 [facility] EMERGENCY DEPARTMENT AT [facility] Provider Note   CSN: [id number] Arrival date & time: 03/22/23  1429     History Chief Complaint  Patient presents with   Shortness of Breath    Jonathan Garner. is a 78 y.o. male.  Patient with past history significant for hypertension, chronic diastolic heart failure, former cigarette smoker, morbid obesity, heart block presents emergency department concerns of shortness of breath.  States that over the last 2 days, shortness of breath worsened primarily with exertion.  Patient has stents and pacemaker in place.  Also history of abdominal aneurysm.  Reports about 2 or 3 weeks ago had his right hip replaced.  He is on Eliquis  and clopidogrel  and has been taking medication.  States he had also noticed some recent leg swelling but this went away after taking his furosemide .  Also has some concerns for dark stools.  Not on any iron supplement.   Shortness of Breath      Home Medications Prior to Admission medications   Medication Sig Start Date End Date Taking? Authorizing Provider  acetaminophen  (TYLENOL ) 500 MG tablet Take 500 mg by mouth every 8 (eight) hours as needed for moderate pain.    [provider]  apixaban  (ELIQUIS ) 5 MG TABS tablet Take 1 tablet (5 mg total) by mouth 2 (two) times daily. 11/22/22   Sabharwal, Ria, DO  clopidogrel  (PLAVIX ) 75 MG tablet Take 1 tablet by mouth once daily 01/03/23   Alvan Dorn FALCON, MD  Cyanocobalamin  ([employer] VITAMIN B-12) 5000 MCG SUBL Place 5,000 mcg under the tongue daily.    [provider]  ENTRESTO  24-26 MG Take 1 tablet by mouth twice daily 01/10/23   Alvan Dorn FALCON, MD  JARDIANCE  10 MG TABS tablet Take 1 tablet by mouth once daily 03/25/22   Alvan Dorn FALCON, MD  methocarbamol  (ROBAXIN ) 500 MG tablet Take 1 tablet (500 mg total) by mouth every 6 (six) hours as needed for muscle spasms. 03/01/23   Kristian Stabs, PA  metoprolol  succinate (TOPROL  XL) 25  MG 24 hr tablet Take 1 tablet (25 mg total) by mouth at bedtime. 09/14/22   Lesia Ozell Barter, PA-C  nitroGLYCERIN  (NITROSTAT ) 0.4 MG SL tablet Place 1 tablet (0.4 mg total) under the tongue every 5 (five) minutes x 3 doses as needed for chest pain. 11/29/20   Strader, Laymon HERO, PA-C  ondansetron  (ZOFRAN ) 4 MG tablet Take 1 tablet (4 mg total) by mouth every 6 (six) hours as needed for nausea. 03/01/23   Kristian Stabs, PA  oxyCODONE  (OXY IR/ROXICODONE ) 5 MG immediate release tablet Take 1-2 tablets (5-10 mg total) by mouth every 8 (eight) hours as needed for severe pain (pain score 7-10). 03/01/23   Kristian Stabs, PA  potassium chloride  SA (KLOR-CON  M20) 20 MEQ tablet Take 1 tablet (20 mEq total) by mouth daily. 10/20/22   Alvan Dorn FALCON, MD  spironolactone  (ALDACTONE ) 25 MG tablet Take 1/2 (one-half) tablet by mouth once daily 11/22/22   Alvan Dorn FALCON, MD  torsemide  (DEMADEX ) 20 MG tablet Take 1 tablet by mouth once daily 02/04/23   Alvan Dorn FALCON, MD  traMADol  (ULTRAM ) 50 MG tablet Take 1 tablet (50 mg total) by mouth every 8 (eight) hours as needed for moderate pain (pain score 4-6). 03/01/23   Kristian Stabs, PA      Allergies    Atorvastatin     Review of Systems   Review of Systems  Respiratory:  Positive for shortness of  breath.   All other systems reviewed and are negative.   Physical Exam Updated Vital Signs BP 132/64   Pulse (!) 104   Temp 98.6 F (37 C) (Oral)   Resp (!) 23   Ht 5' 6 (1.676 m)   Wt 104.3 kg   SpO2 97%   BMI 37.12 kg/m  Physical Exam Vitals and nursing note reviewed.  Constitutional:      General: He is not in acute distress.    Appearance: He is well-developed.  HENT:     Head: Normocephalic and atraumatic.  Eyes:     Conjunctiva/sclera: Conjunctivae normal.  Cardiovascular:     Rate and Rhythm: Normal rate and regular rhythm.     Heart sounds: No murmur heard. Pulmonary:     Effort: Pulmonary effort is normal. No  respiratory distress.     Breath sounds: Normal breath sounds. No decreased breath sounds, wheezing, rhonchi or rales.  Abdominal:     Palpations: Abdomen is soft.     Tenderness: There is no abdominal tenderness.  Musculoskeletal:        General: No swelling.     Cervical back: Neck supple.  Skin:    General: Skin is warm and dry.     Capillary Refill: Capillary refill takes less than 2 seconds.  Neurological:     Mental Status: He is alert.  Psychiatric:        Mood and Affect: Mood normal.     ED Results / Procedures / Treatments   Labs (all labs ordered are listed, but only abnormal results are displayed) Labs Reviewed  CBC WITH DIFFERENTIAL/PLATELET - Abnormal; Notable for the following components:      Result Value   Hemoglobin 12.4 (*)    Platelets 421 (*)    All other components within normal limits  COMPREHENSIVE METABOLIC PANEL - Abnormal; Notable for the following components:   CO2 19 (*)    Glucose, Bld 117 (*)    BUN 31 (*)    Creatinine, Ser 1.87 (*)    Alkaline Phosphatase 140 (*)    GFR, Estimated 36 (*)    Anion gap 17 (*)    All other components within normal limits  I-STAT VENOUS BLOOD GAS, ED - Abnormal; Notable for the following components:   pCO2, Ven 40.4 (*)    pO2, Ven 82 (*)    Acid-base deficit 3.0 (*)    HCT 33.0 (*)    Hemoglobin 11.2 (*)    All other components within normal limits  TROPONIN I (HIGH SENSITIVITY) - Abnormal; Notable for the following components:   Troponin I (High Sensitivity) 23 (*)    All other components within normal limits  TROPONIN I (HIGH SENSITIVITY) - Abnormal; Notable for the following components:   Troponin I (High Sensitivity) 28 (*)    All other components within normal limits  RESP PANEL BY RT-PCR (RSV, FLU A&B, COVID)  RVPGX2  BRAIN NATRIURETIC PEPTIDE  APTT  CBC  BASIC METABOLIC PANEL  VITAMIN B12  FOLATE  IRON AND TIBC  FERRITIN  RETICULOCYTES  RAPID URINE DRUG SCREEN, HOSP PERFORMED  ETHANOL   PROCALCITONIN  CK  OSMOLALITY, URINE  OSMOLALITY  CREATININE, URINE, RANDOM  SODIUM, URINE, RANDOM  MAGNESIUM   PHOSPHORUS  TSH  URINALYSIS, COMPLETE (UACMP) WITH MICROSCOPIC  BETA-HYDROXYBUTYRIC ACID  I-STAT CREATININE, ED  I-STAT CG4 LACTIC ACID, ED  POC OCCULT BLOOD, ED  TYPE AND SCREEN  TROPONIN I (HIGH SENSITIVITY)    EKG None  Radiology  CT Angio Chest/Abd/Pel for Dissection W and/or Wo Contrast Result Date: 03/22/2023 CLINICAL DATA:  Shortness of breath and hypotension. Recent hip replacement surgery 4 weeks ago. History of aortic dissection. EXAM: CT ANGIOGRAPHY CHEST, ABDOMEN AND PELVIS TECHNIQUE: Non-contrast CT of the chest was initially obtained. Multidetector CT imaging through the chest, abdomen and pelvis was performed using the standard protocol during bolus administration of intravenous contrast. Multiplanar reconstructed images and MIPs were obtained and reviewed to evaluate the vascular anatomy. RADIATION DOSE REDUCTION: This exam was performed according to the departmental dose-optimization program which includes automated exposure control, adjustment of the mA and/or kV according to patient size and/or use of iterative reconstruction technique. CONTRAST:  85mL OMNIPAQUE  IOHEXOL  350 MG/ML SOLN COMPARISON:  Chest x-ray from same day. CT chest, abdomen, and pelvis dated March 03, 2022. FINDINGS: CTA CHEST FINDINGS Cardiovascular: Normal heart size. No pericardial effusion. Advanced aortic atherosclerosis again noted. Changes of prior TEVAR again noted with the graft extending from the aortic isthmus into the distal descending thoracic aorta. No aneurysm. Small focus of opacification within the dissection flap in the most distal descending thoracic aorta has decreased in size since the prior study. Unchanged left chest wall pacemaker. No central pulmonary embolism. Mediastinum/Nodes: No enlarged mediastinal, hilar, or axillary lymph nodes. Thyroid  gland, trachea, and  esophagus demonstrate no significant findings. Lungs/Pleura: Lungs are clear. No pleural effusion or pneumothorax. Musculoskeletal: No chest wall abnormality. No acute or significant osseous findings. Chronic oblique fracture through the T8 anterior superior endplate. Review of the MIP images confirms the above findings. 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 Prior EVAR of the infrarenal abdominal aortic aneurysm. The excluded aneurysm sac is unchanged in diameter of 5.5 cm. 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 The focal residual dissection in the descending thoracic aorta may provide collateral supply. The distal branches are patent. SMA: Patent without evidence of aneurysm, dissection, vasculitis or significant stenosis. Renals: Both renal arteries are patent without evidence of aneurysm, dissection, vasculitis, fibromuscular dysplasia or significant stenosis. IMA: Occluded at the origin. Reconstitutes via collateral flow distally. 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 Stable focal non flow-limiting dissection versus penetrating ulceration in the left common femoral artery. Veins: No obvious venous abnormality within the limitations of this arterial phase study. Review of the MIP images confirms the above findings. NON-VASCULAR Hepatobiliary: No focal liver abnormality. Layering tiny gallstones again noted. No gallbladder wall thickening or biliary dilatation. Pancreas: Unremarkable. No pancreatic ductal dilatation or surrounding inflammatory changes. Spleen: Normal in size without focal abnormality. Adrenals/Urinary Tract: Adrenal glands are unremarkable. Mild cortical thinning bilaterally again noted. Kidneys are otherwise normal, without renal calculi, solid lesion, or hydronephrosis. Bladder is  mostly decompressed. Stomach/Bowel: Unchanged small hiatal hernia. Duodenal diverticulum again noted. No bowel wall thickening, distention, or surrounding inflammatory changes. Diffuse colonic diverticulosis. Normal appendix. Lymphatic: No enlarged abdominal or pelvic lymph nodes. Reproductive: Prostate is unremarkable. Other: No free fluid or pneumoperitoneum. Musculoskeletal: No acute or significant osseous findings. Review of the MIP images confirms the above findings. IMPRESSION: Vascular: 1. No acute aortic syndrome. Prior EVAR of the thoracic aorta for dissection without evidence of complication. Small focus of opacification within the dissection flap in the most distal descending thoracic aorta has decreased in size since the prior study. 2. Prior EVAR of the abdominal aorta for aneurysm without evidence of  complication. The treated/excluded infrarenal abdominal aortic aneurysm remains stable in size. 3. 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 4. 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 5. 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 The focal residual dissection in the descending thoracic aorta may provide collateral supply. 6.  Aortic atherosclerosis (ICD10-I70.0). Nonvascular: 1. No acute intrathoracic or intra-abdominal process. 2. Unchanged cholelithiasis. Electronically Signed   By: Elsie ONEIDA Shoulder M.D.   On: 03/22/2023 18:43   DG Chest 2 View Result Date: 03/22/2023 CLINICAL DATA:  Shortness of breath EXAM: CHEST - 2 VIEW COMPARISON:  11/29/2020 FINDINGS: Left-sided pacing device as previously noted. No consolidation or effusion. Stable cardiomediastinal silhouette with extensive descending aortic stent graft. No pneumothorax IMPRESSION: No active cardiopulmonary disease. Electronically Signed   By: Luke Bun M.D.   On: 03/22/2023 16:33     Procedures Procedures    Medications Ordered in ED Medications  sodium chloride  0.9 % bolus 1,000 mL (0 mLs Intravenous Stopped 03/22/23 1950)  iohexol  (OMNIPAQUE ) 350 MG/ML injection 85 mL (85 mLs Intravenous Contrast Given 03/22/23 1809)    ED Course/ Medical Decision Making/ A&P                                 Medical Decision Making Risk Decision regarding hospitalization.   This patient presents to the ED for concern of shortness of breath.  Differential diagnosis includes ACS, PE, pneumonia, viral URI   Lab Tests:  I Ordered, and personally interpreted labs.  The pertinent results include: CBC with no significant abnormalities.  Hemoglobin stable at 12.4.  CMP does show some evidence of dehydration, with creatinine elevated to 1.87 and GFR decreased to 36.  Troponin elevated at 23.  Respiratory panel negative, BNP unremarkable at 60.   Imaging Studies ordered:  I ordered imaging studies including chest x-ray, CT angio chest, abdomen, pelvis, CT angio chest PE I independently visualized and interpreted imaging which showed no acute cardiopulmonary process seen on x-ray,. No acute aortic syndrome. Prior EVAR of the thoracic aorta for dissection without evidence of complication. Small focus of opacification within the dissection flap in the most distal descending thoracic aorta has decreased in size since the prior study. 2. Prior EVAR of the abdominal aorta for aneurysm without evidence of complication. The treated/excluded infrarenal abdominal aortic aneurysm remains stable in size. 3. 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 4. 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 5. 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 The focal residual dissection in the descending thoracic aorta may provide collateral supply. 6.  Aortic atherosclerosis  (ICD10-I70.0). Nonvascular: 1. No acute intrathoracic or intra-abdominal process. 2. Unchanged cholelithiasis. CT angio chest was discontinued so no dedicated chest view seen, however radiologist sees no evidence of PE on CT dissection study. I agree with the radiologist interpretation   Medicines ordered and prescription drug management:  I ordered medication including fluids for hypotension Reevaluation of the patient after these medicines showed that the patient improved I have reviewed the patients home medicines and have made adjustments as needed   Problem List / ED Course:  Patient past history significant for hypertension, chronic diastolic heart failure, former cigarette smoker, morbid obesity, heart block presents the  ED with concerns of shortness of breath.  States that over the last 3 days or so, he has had some worsening shortness of breath primarily with exertion.  He states that he had hip replacement about 3 weeks ago.  He is on Eliquis  and clopidogrel .  He states that he had noted some leg swelling and reach out to his cardiologist office who advised him to restart his furosemide .  The leg swelling improved.  However, she still is having some shortness of breath that he is concerned has not improved.  No prior history of PE or DVT. Patient brought back after notable hypotension with several blood pressures reading with systolics hovering around 90 or less with diastolics lower than 60.  Fluid bolus initiated.  Patient's blood pressure improved. On exam, patient has trace pitting edema bilaterally.  No abnormal lung sounds or new murmur auscultated.  Given recent surgery, concern for possible PE.  Will obtain CT imaging of the chest to rule out PE.  Chest x-ray and CT chest, abdomen, pelvis for dissection ordered from triage given prior history of aortic aneurysm.  Not currently endorsing abdominal pain or chest pain. CT angio chest for PE rule out was discontinued with my  consultation or consultation with any provider. Spoke with Dr. Jud, radiologist, who had read patient's CT dissection study regarding this issue. I asked if he is able to rule out PE with this scan and he said the lungs are adequately visualized with no signs of PE. Did not advise to having a dedicated PE study done at this time. Labs reveal stable hemoglobin. No signs of blood loss. CMP shows concerns of AKI with elevated creatinine and decreased GFR. Respiratory panel negative. APTT normal.Troponin elevated at 23 with delta at 28. Consult to cardiology placed given elevated troponin and cardiac history. Spoke with Dr. Bensimhon, cardiology, who does not feel that this is likely a cardiac issue ongoing. Did advise adding a lactic and VBG for the patient given some signs of acidemia. Likely respiratory acidosis given his shortness of breath, but doubt infectious etiology given normal WBC and normal wound healing of surgical site on right hip. AKI noted and advised admission for this alone. Patient refusing rectal exam at this time even with concerns of dark stool. Attempted to discuss the concern regarding this with patient but he is refusing. Hemoglobin is stable so doubt acute GI bleed, although in the setting of anticoagulation, advised this. Will ensure to inform hospitalist regarding this as he may require GI evaluation if this is positive. Patient's wife did advise that patient may have underlying anxiety. States that he will get worked up about a physical symptom such as SOB and begin to have worsening SOB with hyperventilation. Not on any medications for anxiety. Spoke with Dr. Silvester, hospitalist, who will evaluate patient for admission.  Final Clinical Impression(s) / ED Diagnoses Final diagnoses:  AKI (acute kidney injury) [facility])  Exertional dyspnea    Rx / DC Orders ED Discharge Orders     None         Cecily Legrand DELENA DEVONNA 03/22/23 2319    Dreama Longs, MD 03/23/23  2159

## 2023-03-22 NOTE — Assessment & Plan Note (Signed)
Obtain urine electrolytes gently rehydrate be mindful of history of CHF

## 2023-03-22 NOTE — ED Notes (Signed)
 Assumed pt care.

## 2023-03-22 NOTE — Assessment & Plan Note (Signed)
Appears to be euvolemic/dry.  Given hypotension hold Entresto metoprolol and spironolactone hold torsemide

## 2023-03-22 NOTE — Subjective & Objective (Signed)
 Increasing SOB over the past 3 days, hx of CAD and abd aneurysm Had hip replacement 1 m ago Denies chest pain Swelling in legs improved with use of fluid pills He has known history of CHF Also reports melena for the past 3 days Patient is currently on Eliquis  and Plavix  Not on iron supplement

## 2023-03-22 NOTE — Assessment & Plan Note (Signed)
Allow permissive hypertension 

## 2023-03-22 NOTE — ED Provider Triage Note (Signed)
 Emergency Medicine Provider Triage Evaluation Note  Jonathan Garner. , a 78 y.o. male  was evaluated in triage.  Pt complains of 3 days of shortness of breath.  Denies any chest pain.  Reports he had a hip replacement 4 weeks ago.  Also reports some swelling in his legs but this improves with his water pills.  He does have a history of CHF.  Reports darker bowel movements also in the last 3 days.  Review of Systems  Positive: As above Negative: As above  Physical Exam  BP (!) 92/33   Pulse 61   Temp (!) 97.3 F (36.3 C)   Resp 19   Ht 5' 6 (1.676 m)   Wt 104.3 kg   SpO2 93%   BMI 37.12 kg/m  Gen:   Awake, no distress   Resp:  Increased effort, talking in shorter sentences MSK:   Moves extremities without difficulty    Medical Decision Making  Medically screening exam initiated at 3:01 PM.  Appropriate orders placed.  Jonathan Garner. was informed that the remainder of the evaluation will be completed by another provider, this initial triage assessment does not replace that evaluation, and the importance of remaining in the ED until their evaluation is complete.  Patient does have a history of a AAA will get CT chest abdomen pelvis for dissection study as well as PE study with CTA chest.  BP 90/30, Started patient on NS bolus, 500 mL/h given history of heart failure.  Working on getting patient room    Jonathan Garner, [place] 03/22/23 1501

## 2023-03-22 NOTE — ED Notes (Signed)
Pt transported to CT with RN on cardiac monitor.

## 2023-03-22 NOTE — Assessment & Plan Note (Signed)
Contributing to comorbidity and complicating medical management  Body mass index is 37.12 kg/m.  Nutritional follow up as an out pt would be recommended

## 2023-03-22 NOTE — H&P (Incomplete)
Jonathan Garner Edythe Clarity. NFA:[id number] DOB: [birth year] DOA: 03/22/2023     PCP: Orlene Plum, NP   Outpatient Specialists:  CARDS:  Dr. Dina Rich, MD    Patient arrived to ER on 03/22/23 at 1429 Referred by Attending Alvira Monday, MD   Patient coming from:    home Lives  With family    Chief Complaint:   Chief Complaint  Patient presents with  . Shortness of Breath    HPI: Jonathan Garner. is a 78 y.o. male with medical history significant of HTN, HLD, chronic systolic/diastolic CHF, tobacco abuse, obesity, heart block sp pacemaker, CAD, COPD, OSA,     Presented with   dyspnea on exertion Increasing SOB over the past 3 days, hx of CAD and abd aneurysm Had hip replacement 1 m ago Denies chest pain Swelling in legs improved with use of fluid pills He has known history of CHF Also reports melena for the past 3 days Patient is currently on Eliquis and Plavix Not on iron supplement Denies history of PE/DVT No fever no chills, no cough  Had an episode of waking up in cold sweat few days ago  Denies significant ETOH intake   Does not smoke   Lab Results  Component Value Date   SARSCOV2NAA NEGATIVE 03/22/2023   SARSCOV2NAA NEGATIVE 11/27/2020   SARSCOV2NAA NEGATIVE 08/23/2020   SARSCOV2NAA NEGATIVE 12/28/2019        Regarding pertinent Chronic problems:      HTN on toprol  chronic CHF diastolic/systolic/ combined -  On torsemide, entresto,  last echo  Recent Results (from the past 46962 hours)  ECHOCARDIOGRAM COMPLETE   Collection Time: 07/22/22  4:54 PM  Result Value   Area-P 1/2 3.77   S' Lateral 4.50   Est EF 30 - 35%   Narrative      ECHOCARDIOGRAM REPORT   IMPRESSIONS    1. Poor Echo windows. Left ventricular ejection fraction, by estimation, is 30 to 40%. The left ventricle has moderately decreased function. Recommend Limited Echo with contrast for accurate LVEF and RWMA assessment. Left ventricular endocardial border  not  optimally defined to evaluate regional wall motion. There is mild left ventricular hypertrophy. Left ventricular diastolic parameters are consistent with Grade I diastolic dysfunction (impaired relaxation).  2. Right ventricular systolic function is normal. The right ventricular size is normal. There is normal pulmonary artery systolic pressure.  3. The mitral valve is abnormal. No evidence of mitral valve regurgitation. No evidence of mitral stenosis. Severe mitral annular calcification.  4. The aortic valve was not well visualized. Aortic valve regurgitation is not visualized. No aortic stenosis is present.  5. Aortic dilatation noted. There is borderline dilatation of the ascending aorta, measuring 38 mm.  6. The inferior vena cava is normal in size with greater than 50% respiratory variability, suggesting right atrial pressure of 3 mmHg.  Comparison(s): Changes from prior study are noted. LVEF is moderately reduced now compared to prior Echos. Consider Limited Echo with contrast or alternative imaging for accurate LVEF and RWMA assessment.        CAD  - On   betablocker, Plavix                 - followed by cardiology                   obesity-   BMI Readings from Last 1 Encounters:  03/22/23 37.12 kg/m       COPD - not  on baseline oxygen       OSA -on nocturnal   CPAP,     A. Fib -   atrial fibrillation CHA2DS2 vas score 6      current  on anticoagulation with  Eliquis,          -  Rate control:  Currently controlled with  Toprolol,       CKD stage IIIb  baseline Cr 1.5 Estimated Creatinine Clearance: 36.8 mL/min (A) (by C-G formula based on SCr of 1.87 mg/dL (H)).  Lab Results  Component Value Date   CREATININE 1.87 (H) 03/22/2023   CREATININE 1.10 03/01/2023   CREATININE 1.53 (H) 02/17/2023   Lab Results  Component Value Date   NA 139 03/22/2023   CL 103 03/22/2023   K 3.9 03/22/2023   CO2 19 (L) 03/22/2023   BUN 31 (H) 03/22/2023   CREATININE 1.87 (H)  03/22/2023   GFRNONAA 36 (L) 03/22/2023   CALCIUM 9.7 03/22/2023   ALBUMIN 4.0 03/22/2023   GLUCOSE 117 (H) 03/22/2023   Hepatic Function Panel     Component Value Date/Time   PROT 7.7 03/22/2023 0503   ALBUMIN 4.0 03/22/2023 0503   AST 26 03/22/2023 0503   ALT 18 03/22/2023 0503   ALKPHOS 140 (H) 03/22/2023 0503   BILITOT 0.6 03/22/2023 0503   No results found for requested labs within last 1095 days.   Chronic anemia - baseline hg Hemoglobin & Hematocrit  Recent Labs    03/01/23 0338 03/22/23 0503 03/22/23 2112  HGB 11.2* 12.4* 11.2*   Iron/TIBC/Ferritin/ %Sat No results found for: "IRON", "TIBC", "FERRITIN", "IRONPCTSAT"  While in ER:     Noted to be somewhat hypotensive in the emergency department with blood pressure down to 90s started on IV fluids CTA showed no PE No evidence of worsening anemia But did note a slightly increase in creatinine Respiratory panel unremarkable troponin 23-28 Cardiology was consulted Patient refused rectal exams   Lab Orders         Resp panel by RT-PCR (RSV, Flu A&B, Covid) Anterior Nasal Swab         CBC with Differential         Comprehensive metabolic panel         Brain natriuretic peptide         APTT         POC occult blood, ED         I-stat Creatinine, ED         I-Stat venous blood gas, ([facility], [facility], [facility])       CXR -  NON acute    CTA chest abd/pelvis-  nonacute, no PE,  no evidence of infiltrate  Following Medications were ordered in ER: Medications  sodium chloride 0.9 % bolus 1,000 mL (0 mLs Intravenous Stopped 03/22/23 1950)  iohexol (OMNIPAQUE) 350 MG/ML injection 85 mL (85 mLs Intravenous Contrast Given 03/22/23 1809)    _______________________________________________________ ER Provider Called:      Cardiology Dr.Bensimohn  They Recommend admit to medicine    SEEN in ER     ED Triage Vitals  Encounter Vitals Group     BP 03/22/23 1457 (!) 92/33     Systolic BP Percentile --      Diastolic BP  Percentile --      Pulse Rate 03/22/23 1457 61     Resp 03/22/23 1457 19     Temp 03/22/23 1457 (!) 97.3 F (36.3 C)     Temp  Source 03/22/23 2146 Oral     SpO2 03/22/23 1457 93 %     Weight 03/22/23 1446 230 lb (104.3 kg)     Height 03/22/23 1446 5\' 6"  (1.676 m)     Head Circumference --      Peak Flow --      Pain Score 03/22/23 1446 0     Pain Loc --      Pain Education --      Exclude from Growth Chart --   NFAO(13)@     _________________________________________ Significant initial  Findings: Abnormal Labs Reviewed  CBC WITH DIFFERENTIAL/PLATELET - Abnormal; Notable for the following components:      Result Value   Hemoglobin 12.4 (*)    Platelets 421 (*)    All other components within normal limits  COMPREHENSIVE METABOLIC PANEL - Abnormal; Notable for the following components:   CO2 19 (*)    Glucose, Bld 117 (*)    BUN 31 (*)    Creatinine, Ser 1.87 (*)    Alkaline Phosphatase 140 (*)    GFR, Estimated 36 (*)    Anion gap 17 (*)    All other components within normal limits  I-STAT VENOUS BLOOD GAS, ED - Abnormal; Notable for the following components:   pCO2, Ven 40.4 (*)    pO2, Ven 82 (*)    Acid-base deficit 3.0 (*)    HCT 33.0 (*)    Hemoglobin 11.2 (*)    All other components within normal limits  TROPONIN I (HIGH SENSITIVITY) - Abnormal; Notable for the following components:   Troponin I (High Sensitivity) 23 (*)    All other components within normal limits  TROPONIN I (HIGH SENSITIVITY) - Abnormal; Notable for the following components:   Troponin I (High Sensitivity) 28 (*)    All other components within normal limits      _________________________ Troponin  ordered Cardiac Panel (last 3 results) Recent Labs    03/22/23 0503 03/22/23 1652  TROPONINIHS 23* 28*     ECG: Ordered Personally reviewed and interpreted by me showing: HR : 62 Rhythm:  Paced  no evidence of ischemic changes QTC 426  BNP (last 3 results) Recent Labs     06/17/22 1625 11/04/22 1100 03/22/23 1503  BNP 29.6 39.9 60.3     COVID-19 Labs  No results for input(s): "DDIMER", "FERRITIN", "LDH", "CRP" in the last 72 hours.  Lab Results  Component Value Date   SARSCOV2NAA NEGATIVE 03/22/2023   SARSCOV2NAA NEGATIVE 11/27/2020   SARSCOV2NAA NEGATIVE 08/23/2020   SARSCOV2NAA NEGATIVE 12/28/2019   ____________  The recent clinical data is shown below. Vitals:   03/22/23 1658 03/22/23 1749 03/22/23 1815 03/22/23 2146  BP: (!) 72/61 (!) 147/81 107/65 121/67  Pulse:  (!) 104 (!) 101 (!) 104  Resp:  16 13 15   Temp:    98.6 F (37 C)  TempSrc:    Oral  SpO2:  98% 99% 96%  Weight:      Height:        WBC     Component Value Date/Time   WBC 8.5 03/22/2023 0503   LYMPHSABS 1.8 03/22/2023 0503   LYMPHSABS 1.9 06/17/2022 1625   MONOABS 1.0 03/22/2023 0503   EOSABS 0.1 03/22/2023 0503   EOSABS 0.2 06/17/2022 1625   BASOSABS 0.0 03/22/2023 0503   BASOSABS 0.0 06/17/2022 1625     Lactic Acid, Venous    Component Value Date/Time   LATICACIDVEN 1.7 03/22/2023 2113     Procalcitonin  Ordered      UA  ordered     Results for orders placed or performed during the hospital encounter of 03/22/23  Resp panel by RT-PCR (RSV, Flu A&B, Covid) Anterior Nasal Swab     Status: None   Collection Time: 03/22/23  2:54 PM   Specimen: Anterior Nasal Swab  Result Value Ref Range Status   SARS Coronavirus 2 by RT PCR NEGATIVE NEGATIVE Final   Influenza A by PCR NEGATIVE NEGATIVE Final   Influenza B by PCR NEGATIVE NEGATIVE Final         Resp Syncytial Virus by PCR NEGATIVE NEGATIVE Final          ________________________________________________________________  Venous  Blood Gas result:  pH 7.345 Sodium 139 mmol/L   pCO2, Ven 40.4 Low  mmHg Potassium 3.9 mmol/L  pO2, Ven 82 High  mmHg      __________________________________________________________ Recent Labs  Lab 03/22/23 0503 03/22/23 2112  NA 139 139  K 4.0 3.9  CO2 19*  --    GLUCOSE 117*  --   BUN 31*  --   CREATININE 1.87*  --   CALCIUM 9.7  --     Cr   Up from baseline see below Lab Results  Component Value Date   CREATININE 1.87 (H) 03/22/2023   CREATININE 1.10 03/01/2023   CREATININE 1.53 (H) 02/17/2023    Recent Labs  Lab 03/22/23 0503  AST 26  ALT 18  ALKPHOS 140*  BILITOT 0.6  PROT 7.7  ALBUMIN 4.0   Lab Results  Component Value Date   CALCIUM 9.7 03/22/2023    Plt: Lab Results  Component Value Date   PLT 421 (H) 03/22/2023    Recent Labs  Lab 03/22/23 0503 03/22/23 2112  WBC 8.5  --   NEUTROABS 5.6  --   HGB 12.4* 11.2*  HCT 39.8 33.0*  MCV 86.7  --   PLT 421*  --     HG/HCT stable,       Component Value Date/Time   HGB 11.2 (L) 03/22/2023 2112   HGB 15.0 08/11/2022 1515   HCT 33.0 (L) 03/22/2023 2112   HCT 47.3 08/11/2022 1515   MCV 86.7 03/22/2023 0503   MCV 95 08/11/2022 1515    ______________________________________ Hospitalist was called for admission for   AKI   Exertional dyspnea    The following Work up has been ordered so far:  Orders Placed This Encounter  Procedures  . Resp panel by RT-PCR (RSV, Flu A&B, Covid) Anterior Nasal Swab  . DG Chest 2 View  . CT Angio Chest/Abd/Pel for Dissection W and/or Wo Contrast  . CBC with Differential  . Comprehensive metabolic panel  . Brain natriuretic peptide  . APTT  . ED Cardiac monitoring  . Inpatient consult to Cardiology  . Consult to hospitalist  . POC occult blood, ED  . I-stat Creatinine, ED  . I-Stat venous blood gas, ([facility], [facility], [facility])  . ED EKG  . Type and screen [facility]  . Insert peripheral IV     OTHER Significant initial  Findings:  labs showing:     DM  labs:  HbA1C: No results for input(s): "HGBA1C" in the last 8760 hours.     CBG (last 3)  No results for input(s): "GLUCAP" in the last 72 hours.        Cultures: No results found for: "SDES", "SPECREQUEST", "CULT", "REPTSTATUS"   Radiological Exams on  Admission: CT Angio Chest/Abd/Pel  for Dissection W and/or Wo Contrast Result Date: 03/22/2023 CLINICAL DATA:  Shortness of breath and hypotension. Recent hip replacement surgery 4 weeks ago. History of aortic dissection. EXAM: CT ANGIOGRAPHY CHEST, ABDOMEN AND PELVIS TECHNIQUE: Non-contrast CT of the chest was initially obtained. Multidetector CT imaging through the chest, abdomen and pelvis was performed using the standard protocol during bolus administration of intravenous contrast. Multiplanar reconstructed images and MIPs were obtained and reviewed to evaluate the vascular anatomy. RADIATION DOSE REDUCTION: This exam was performed according to the departmental dose-optimization program which includes automated exposure control, adjustment of the mA and/or kV according to patient size and/or use of iterative reconstruction technique. CONTRAST:  85mL OMNIPAQUE IOHEXOL 350 MG/ML SOLN COMPARISON:  Chest x-ray from same day. CT chest, abdomen, and pelvis dated March 03, 2022. FINDINGS: CTA CHEST FINDINGS Cardiovascular: Normal heart size. No pericardial effusion. Advanced aortic atherosclerosis again noted. Changes of prior TEVAR again noted with the graft extending from the aortic isthmus into the distal descending thoracic aorta. No aneurysm. Small focus of opacification within the dissection flap in the most distal descending thoracic aorta has decreased in size since the prior study. Unchanged left chest wall pacemaker. No central pulmonary embolism. Mediastinum/Nodes: No enlarged mediastinal, hilar, or axillary lymph nodes. Thyroid gland, trachea, and esophagus demonstrate no significant findings. Lungs/Pleura: Lungs are clear. No pleural effusion or pneumothorax. Musculoskeletal: No chest wall abnormality. No acute or significant osseous findings. Chronic oblique fracture through the T8 anterior superior endplate. Review of the MIP images confirms the above findings. CTA ABDOMEN AND PELVIS FINDINGS VASCULAR  Aorta: Unchanged aneurysmal dilatation of the juxtarenal aorta measuring 5.5 cm. Prior EVAR of the infrarenal abdominal aortic aneurysm. The excluded aneurysm sac is unchanged in diameter of 5.5 cm. 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 The focal residual dissection in the descending thoracic aorta may provide collateral supply. The distal branches are patent. SMA: Patent without evidence of aneurysm, dissection, vasculitis or significant stenosis. Renals: Both renal arteries are patent without evidence of aneurysm, dissection, vasculitis, fibromuscular dysplasia or significant stenosis. IMA: Occluded at the origin. Reconstitutes via collateral flow distally. 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 Stable focal non flow-limiting dissection versus penetrating ulceration in the left common femoral artery. Veins: No obvious venous abnormality within the limitations of this arterial phase study. Review of the MIP images confirms the above findings. NON-VASCULAR Hepatobiliary: No focal liver abnormality. Layering tiny gallstones again noted. No gallbladder wall thickening or biliary dilatation. Pancreas: Unremarkable. No pancreatic ductal dilatation or surrounding inflammatory changes. Spleen: Normal in size without focal abnormality. Adrenals/Urinary Tract: Adrenal glands are unremarkable. Mild cortical thinning bilaterally again noted. Kidneys are otherwise normal, without renal calculi, solid lesion, or hydronephrosis. Bladder is mostly decompressed. Stomach/Bowel: Unchanged small hiatal hernia. Duodenal diverticulum again noted. No bowel wall thickening, distention, or surrounding inflammatory changes. Diffuse colonic diverticulosis. Normal appendix. Lymphatic: No enlarged abdominal or pelvic lymph nodes. Reproductive: Prostate is unremarkable. Other: No free fluid or pneumoperitoneum.  Musculoskeletal: No acute or significant osseous findings. Review of the MIP images confirms the above findings. IMPRESSION: Vascular: 1. No acute aortic syndrome. Prior EVAR of the thoracic aorta for dissection without evidence of complication. Small focus of opacification within the dissection flap in the most distal descending thoracic aorta has decreased in size since the prior study. 2. Prior EVAR of the abdominal aorta for aneurysm without evidence of complication. The treated/excluded  infrarenal abdominal aortic aneurysm remains stable in size. 3. 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 4. 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 5. 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 The focal residual dissection in the descending thoracic aorta may provide collateral supply. 6.  Aortic atherosclerosis (ICD10-I70.0). Nonvascular: 1. No acute intrathoracic or intra-abdominal process. 2. Unchanged cholelithiasis. Electronically Signed   By: Obie Dredge M.D.   On: 03/22/2023 18:43   DG Chest 2 View Result Date: 03/22/2023 CLINICAL DATA:  Shortness of breath EXAM: CHEST - 2 VIEW COMPARISON:  11/29/2020 FINDINGS: Left-sided pacing device as previously noted. No consolidation or effusion. Stable cardiomediastinal silhouette with extensive descending aortic stent graft. No pneumothorax IMPRESSION: No active cardiopulmonary disease. Electronically Signed   By: Jasmine Pang M.D.   On: 03/22/2023 16:33   _______________________________________________________________________________________________________ Latest  Blood pressure 121/67, pulse (!) 104, temperature 98.6 F (37 C), temperature source Oral, resp. rate 15, height 5\' 6"  (1.676 m), weight 104.3 kg, SpO2 96%.   Vitals  labs and radiology finding personally reviewed  Review of Systems:    Pertinent positives  include:   fatigue, dyspnea on exertion, Constitutional:  No weight loss, night sweats, Fevers, chills, weight loss  HEENT:  No headaches, Difficulty swallowing,Tooth/dental problems,Sore throat,  No sneezing, itching, ear ache, nasal congestion, post nasal drip,  Cardio-vascular:  No chest pain, Orthopnea, PND, anasarca, dizziness, palpitations.no Bilateral lower extremity swelling  GI:  No heartburn, indigestion, abdominal pain, nausea, vomiting, diarrhea, change in bowel habits, loss of appetite, melena, blood in stool, hematemesis Resp:  no shortness of breath at rest. No  No excess mucus, no productive cough, No non-productive cough, No coughing up of blood.No change in color of mucus.No wheezing. Skin:  no rash or lesions. No jaundice GU:  no dysuria, change in color of urine, no urgency or frequency. No straining to urinate.  No flank pain.  Musculoskeletal:  No joint pain or no joint swelling. No decreased range of motion. No back pain.  Psych:  No change in mood or affect. No depression or anxiety. No memory loss.  Neuro: no localizing neurological complaints, no tingling, no weakness, no double vision, no gait abnormality, no slurred speech, no confusion  All systems reviewed and apart from HOPI all are negative _______________________________________________________________________________________________ Past Medical History:   Past Medical History:  Diagnosis Date  . AAA (abdominal aortic aneurysm) (HCC)   . Arthritis   . CAD (coronary artery disease)   . CHF (congestive heart failure) (HCC)   . COPD (chronic obstructive pulmonary disease) (HCC)   . Diverticulosis   . Dyspnea    W/ EXERTION   . Dysrhythmia    Complete Heart block   has PPM  . Gout   . Gynecomastia   . Hx of emphysema (HCC)   . Hyperlipidemia   . Hypertension   . Lumbar spinal stenosis   . Myocardial infarction (HCC)   . OSA (obstructive sleep apnea)   . Pedal edema   . Pre-diabetes   .  Presence of permanent cardiac pacemaker     Past Surgical History:  Procedure Laterality Date  . ABDOMINAL AORTIC ANEURYSM REPAIR  2013 and 2018  . BACK SURGERY  1992   L4-5 PLIF  done in [place],  Dr. Dorothy Puffer ?  Marland Kitchen CARDIAC CATHETERIZATION  04/2016   [place], [place]  . CARDIOVERSION N/A 09/13/2022  Procedure: CARDIOVERSION;  Surgeon: Dorthula Nettles, DO;  Location: [facility];  Service: Cardiovascular;  Laterality: N/A;  . ELBOW ARTHROSCOPY Left 2011  . EYE SURGERY Bilateral 2024   cataract extraction  . I & D EXTREMITY Right 08/23/2020   Procedure: IRRIGATION AND DEBRIDEMENT HAND;  Surgeon: Mack Hook, MD;  Location[facility] OR;  Service: Orthopedics;  Laterality: Right;  Wound VAC application  . I & D EXTREMITY Right 08/26/2020   Procedure: IRRIGATION AND DEBRIDEMENT RIGHT HAND;  Surgeon: Allena Napoleon, MD;  Location: [facility] OR;  Service: Plastics;  Laterality: Right;  . I & D EXTREMITY Right 08/29/2020   Procedure: INCISION AND DEBRIDEMENT, HAND;  Surgeon: Allena Napoleon, MD;  Location: [facility] OR;  Service: Plastics;  Laterality: Right;  . NECK SURGERY  1991   ACDF  ? 1 level, patient can't remember, done in [place]  . PACEMAKER IMPLANT N/A 11/28/2020   Procedure: PACEMAKER IMPLANT;  Surgeon: Regan Lemming, MD;  Location: [facility];  Service: Cardiovascular;  Laterality: N/A;  . RIGHT/LEFT HEART CATH AND CORONARY ANGIOGRAPHY N/A 08/12/2022   Procedure: RIGHT/LEFT HEART CATH AND CORONARY ANGIOGRAPHY;  Surgeon: Orbie Pyo, MD;  Location: [facility];  Service: Cardiovascular;  Laterality: N/A;  . THORACIC AORTIC ENDOVASCULAR STENT GRAFT N/A 10/26/2017   Procedure: THORACIC AORTIC ENDOVASCULAR STENT GRAFT WITH ILIAC EXTENSIONS;  Surgeon: Larina Earthly, MD;  Location: [facility] OR;  Service: Vascular;  Laterality: N/A;  PATIENT WILL NEED SPINAL CORD DRAIN BY ANESTHESIA  . TOTAL HIP ARTHROPLASTY Right 02/28/2023   Procedure: TOTAL HIP ARTHROPLASTY  ANTERIOR APPROACH;  Surgeon: Ollen Gross, MD;  Location: [facility] ORS;  Service: Orthopedics;  Laterality: Right;  Marland Kitchen VASECTOMY  1976    Social History:  Ambulatory *** independently cane, walker  wheelchair bound, bed bound     reports that he quit smoking about 2 years ago. His smoking use included cigars and cigarettes. He started smoking about 61 years ago. He has a 14.7 pack-year smoking history. He has never used smokeless tobacco. He reports current alcohol use. He reports that he does not use drugs.   Family History:   Family History  Problem Relation Age of Onset  . Alzheimer's disease Mother   . Heart attack Father   . Leukemia Father   . Hypertension Father   . Gout Maternal Uncle   . Hypertension Son    ______________________________________________________________________________________________ Allergies: Allergies  Allergen Reactions  . Atorvastatin     joint pain      Prior to Admission medications   Medication Sig Start Date End Date Taking? Authorizing Provider  acetaminophen (TYLENOL) 500 MG tablet Take 500 mg by mouth every 8 (eight) hours as needed for moderate pain.    [provider]  apixaban (ELIQUIS) 5 MG TABS tablet Take 1 tablet (5 mg total) by mouth 2 (two) times daily. 11/22/22   Sabharwal, Eliezer Lofts, DO  clopidogrel (PLAVIX) 75 MG tablet Take 1 tablet by mouth once daily 01/03/23   Antoine Poche, MD  Cyanocobalamin ([employer] VITAMIN B-12) 5000 MCG SUBL Place 5,000 mcg under the tongue daily.    [provider]  ENTRESTO 24-26 MG Take 1 tablet by mouth twice daily 01/10/23   Antoine Poche, MD  JARDIANCE 10 MG TABS tablet Take 1 tablet by mouth once daily 03/25/22   Antoine Poche, MD  methocarbamol (ROBAXIN) 500 MG tablet Take 1 tablet (500 mg total) by mouth every 6 (six) hours  as needed for muscle spasms. 03/01/23   Eartha Inch, PA  metoprolol succinate (TOPROL XL) 25 MG 24 hr tablet Take 1 tablet (25 mg total) by mouth at  bedtime. 09/14/22   Graciella Freer, PA-C  nitroGLYCERIN (NITROSTAT) 0.4 MG SL tablet Place 1 tablet (0.4 mg total) under the tongue every 5 (five) minutes x 3 doses as needed for chest pain. 11/29/20   Strader, Lennart Pall, PA-C  ondansetron (ZOFRAN) 4 MG tablet Take 1 tablet (4 mg total) by mouth every 6 (six) hours as needed for nausea. 03/01/23   Eartha Inch, PA  oxyCODONE (OXY IR/ROXICODONE) 5 MG immediate release tablet Take 1-2 tablets (5-10 mg total) by mouth every 8 (eight) hours as needed for severe pain (pain score 7-10). 03/01/23   Eartha Inch, PA  potassium chloride SA (KLOR-CON M20) 20 MEQ tablet Take 1 tablet (20 mEq total) by mouth daily. 10/20/22   Antoine Poche, MD  spironolactone (ALDACTONE) 25 MG tablet Take 1/2 (one-half) tablet by mouth once daily 11/22/22   Antoine Poche, MD  torsemide [facility]) 20 MG tablet Take 1 tablet by mouth once daily 02/04/23   Antoine Poche, MD  traMADol (ULTRAM) 50 MG tablet Take 1 tablet (50 mg total) by mouth every 8 (eight) hours as needed for moderate pain (pain score 4-6). 03/01/23   Eartha Inch, PA    ___________________________________________________________________________________________________ Physical Exam:    03/22/2023    9:46 PM 03/22/2023    6:15 PM 03/22/2023    5:49 PM  Vitals with BMI  Systolic 121 107 811  Diastolic 67 65 81  Pulse 104 101 104    1. General:  in No  Acute distress   Chronically ill   -appearing 2. Psychological: Alert and   Oriented 3. Head/ENT:  Dry Mucous Membranes                          Head Non traumatic, neck supple                        Poor Dentition 4. SKIN: decreased Skin turgor,  Skin clean Dry and intact no rash    5. Heart: Regular rate and rhythm no*** Murmur, no Rub or gallop 6. Lungs: ***Clear to auscultation bilaterally, no wheezes or crackles   7. Abdomen: Soft, ***non-tender, Non distended *** obese ***bowel sounds present 8. Lower extremities: no  clubbing, cyanosis, no ***edema 9. Neurologically Grossly intact, moving all 4 extremities equally *** strength 5 out of 5 in all 4 extremities cranial nerves II through XII intact 10. MSK: Normal range of motion    Chart has been reviewed  ______________________________________________________________________________________________  Assessment/Plan 78 y.o. male with medical history significant of HTN, HLD, chronic diastolic CHF, tobacco abuse, obesity, heart block sp pacemaker, CAD, COPD, OSA,    Admitted for   AKI   Exertional dyspnea    Present on Admission: . AKI (acute kidney injury) (HCC) . CHB (complete heart block) (HCC) . Chronic diastolic heart failure (HCC) . COPD GOLD 0 / still smoking  . Hypertension . Morbid obesity due to excess calories (HCC) . Metabolic acidosis . CAD (coronary artery disease)     AKI (acute kidney injury) (HCC) Obtain urine electrolytes gently rehydrate be mindful of history of CHF  CHB (complete heart block) (HCC) Status post pacemaker  Chronic diastolic heart failure (HCC) Appears to be euvolemic/dry.  Given hypotension hold Entresto metoprolol and  spironolactone hold torsemide  COPD GOLD 0 / still smoking  Currently does not seem to be contributing Continue home medications  Hypertension Allow permissive hypertension  Morbid obesity due to excess calories (HCC) Contributing to comorbidity and complicating medical management  Body mass index is 37.12 kg/m.  Nutritional follow up as an out pt would be recommended   OSA on CPAP Chronic  Metabolic acidosis Lactic acid unremarkable check UA for ketones Increased respiratory drive could be secondary to metabolic acidosis appears to be well compensated  CAD (coronary artery disease) Monitor given slightly elevated troponin Given dyspnea on exertion will repeat echo   Other plan as per orders.  DVT prophylaxis:  Eliquis   Code Status:    Code Status: Prior FULL CODE ***  DNR/DNI ***comfort care as per patient ***family  I had personally discussed CODE STATUS with patient and family*  ACP   none    Family Communication:   Family not at  Bedside  plan of care was discussed on the phone with *** Son, Daughter, Wife, Husband, Sister, Brother , father, mother  Diet    Disposition Plan:             To home once workup is complete and patient is stable   Following barriers for discharge:                             Dyspnea work up is complete                            Electrolytes corrected                                                          Will need consultants to evaluate patient prior to discharge       Consult Orders  (From admission, onward)           Start     Ordered   03/22/23 2119  Consult to hospitalist  Once       Provider:  (Not yet assigned)  Question Answer Comment  Place call to: [facility]   Reason for Consult Consult      03/22/23 2118                               Consults called: Cardiology aware   Admission status:  ED Disposition     ED Disposition  Admit   Condition  --   Comment  Hospital Area: [facility] [[id number]]  Level of Care: Progressive [102]  Admit to Progressive based on following criteria: CARDIOVASCULAR & THORACIC of moderate stability with acute coronary syndrome symptoms/low risk myocardial infarction/hypertensive urgency/arrhythmias/heart failure potentially compromising stability and stable post cardiovascular intervention patients.  May place patient in observation at [facility] or [facility] if equivalent level of care is available:: No  Covid Evaluation: Asymptomatic - no recent exposure (last 10 days) testing not required  Diagnosis: AKI (acute kidney injury) [facility]) [[id number]]  Admitting Physician: Therisa Doyne [[id number]]  Attending Physician: Therisa Doyne [[id number]]           Obs      Level of care progressive  Lab Results  Component  Value Date   SARSCOV2NAA NEGATIVE 03/22/2023     Precautions: admitted as   Covid Negative    Jocsan Mcginley 03/22/2023, 11:03 PM    [facility]     after 2 AM please page floor coverage PA If 7AM-7PM, please contact the day team taking care of the patient using [email or web address]

## 2023-03-22 NOTE — Assessment & Plan Note (Signed)
 Status post pacemaker

## 2023-03-22 NOTE — Assessment & Plan Note (Signed)
Lactic acid unremarkable check UA for ketones Increased respiratory drive could be secondary to metabolic acidosis appears to be well compensated

## 2023-03-22 NOTE — ED Notes (Signed)
Report given to [facility]

## 2023-03-22 NOTE — Assessment & Plan Note (Signed)
Monitor given slightly elevated troponin Given dyspnea on exertion will repeat echo

## 2023-03-22 NOTE — H&P (Signed)
 Jonathan W Turlington Jr. FMW:[id number] DOB: 1945/06/01 DOA: 03/22/2023     PCP: Rosan Jacquline NOVAK, NP   Outpatient Specialists:  CARDS:  Dr. Alvan Carrier, MD    Patient arrived to ER on 03/22/23 at 1429 Referred by Attending Dreama Longs, MD   Patient coming from:    home Lives  With family    Chief Complaint:   Chief Complaint  Patient presents with   Shortness of Breath    HPI: Jonathan Garner. is a 78 y.o. male with medical history significant of HTN, HLD, chronic systolic/diastolic CHF, tobacco abuse, obesity, heart block sp pacemaker, CAD, COPD, OSA,     Presented with   dyspnea on exertion Increasing SOB over the past 3 days, hx of CAD and abd aneurysm Had hip replacement 1 m ago Denies chest pain Swelling in legs improved with use of fluid pills He has known history of CHF Also reports melena for the past 3 days Patient is currently on Eliquis  and Plavix  Not on iron supplement Denies history of PE/DVT No fever no chills, no cough  Had an episode of waking up in cold sweat few days ago  Denies significant ETOH intake   Does not smoke   Lab Results  Component Value Date   SARSCOV2NAA NEGATIVE 03/22/2023   SARSCOV2NAA NEGATIVE 11/27/2020   SARSCOV2NAA NEGATIVE 08/23/2020   SARSCOV2NAA NEGATIVE 12/28/2019        Regarding pertinent Chronic problems:      HTN on toprol   chronic CHF diastolic/systolic/ combined -  On torsemide , entresto ,  last echo  Recent Results (from the past 56199 hours)  ECHOCARDIOGRAM COMPLETE   Collection Time: 07/22/22  4:54 PM  Result Value   Area-P 1/2 3.77   S' Lateral 4.50   Est EF 30 - 35%   Narrative      ECHOCARDIOGRAM REPORT   IMPRESSIONS    1. Poor Echo windows. Left ventricular ejection fraction, by estimation, is 30 to 40%. The left ventricle has moderately decreased function. Recommend Limited Echo with contrast for accurate LVEF and RWMA assessment. Left ventricular endocardial border  not  optimally defined to evaluate regional wall motion. There is mild left ventricular hypertrophy. Left ventricular diastolic parameters are consistent with Grade I diastolic dysfunction (impaired relaxation).  2. Right ventricular systolic function is normal. The right ventricular size is normal. There is normal pulmonary artery systolic pressure.  3. The mitral valve is abnormal. No evidence of mitral valve regurgitation. No evidence of mitral stenosis. Severe mitral annular calcification.  4. The aortic valve was not well visualized. Aortic valve regurgitation is not visualized. No aortic stenosis is present.  5. Aortic dilatation noted. There is borderline dilatation of the ascending aorta, measuring 38 mm.  6. The inferior vena cava is normal in size with greater than 50% respiratory variability, suggesting right atrial pressure of 3 mmHg.  Comparison(s): Changes from prior study are noted. LVEF is moderately reduced now compared to prior Echos. Consider Limited Echo with contrast or alternative imaging for accurate LVEF and RWMA assessment.        CAD  - On   betablocker, Plavix                  - followed by cardiology                   obesity-   BMI Readings from Last 1 Encounters:  03/22/23 37.12 kg/m       COPD - not  on baseline oxygen       OSA -on nocturnal   CPAP,     A. Fib -   atrial fibrillation CHA2DS2 vas score 6      current  on anticoagulation with  Eliquis ,          -  Rate control:  Currently controlled with  Toprolol,       CKD stage IIIb  baseline Cr 1.5 Estimated Creatinine Clearance: 36.8 mL/min (A) (by C-G formula based on SCr of 1.87 mg/dL (H)).  Lab Results  Component Value Date   CREATININE 1.87 (H) 03/22/2023   CREATININE 1.10 03/01/2023   CREATININE 1.53 (H) 02/17/2023   Lab Results  Component Value Date   NA 139 03/22/2023   CL 103 03/22/2023   K 3.9 03/22/2023   CO2 19 (L) 03/22/2023   BUN 31 (H) 03/22/2023   CREATININE 1.87 (H)  03/22/2023   GFRNONAA 36 (L) 03/22/2023   CALCIUM  9.7 03/22/2023   ALBUMIN 4.0 03/22/2023   GLUCOSE 117 (H) 03/22/2023   Hepatic Function Panel     Component Value Date/Time   PROT 7.7 03/22/2023 0503   ALBUMIN 4.0 03/22/2023 0503   AST 26 03/22/2023 0503   ALT 18 03/22/2023 0503   ALKPHOS 140 (H) 03/22/2023 0503   BILITOT 0.6 03/22/2023 0503   No results found for requested labs within last 1095 days.   Chronic anemia - baseline hg Hemoglobin & Hematocrit  Recent Labs    03/01/23 0338 03/22/23 0503 03/22/23 2112  HGB 11.2* 12.4* 11.2*   Iron/TIBC/Ferritin/ %Sat No results found for: IRON, TIBC, FERRITIN, IRONPCTSAT  While in ER:     Noted to be somewhat hypotensive in the emergency department with blood pressure down to 90s started on IV fluids CTA showed no PE No evidence of worsening anemia But did note a slightly increase in creatinine Respiratory panel unremarkable troponin 23-28 Cardiology was consulted Patient refused rectal exams   Lab Orders         Resp panel by RT-PCR (RSV, Flu A&B, Covid) Anterior Nasal Swab         CBC with Differential         Comprehensive metabolic panel         Brain natriuretic peptide         APTT         POC occult blood, ED         I-stat Creatinine, ED         I-Stat venous blood gas, ([facility], [facility], [facility])       CXR -  NON acute    CTA chest abd/pelvis-  nonacute, no PE,  no evidence of infiltrate  Following Medications were ordered in ER: Medications  sodium chloride  0.9 % bolus 1,000 mL (0 mLs Intravenous Stopped 03/22/23 1950)  iohexol  (OMNIPAQUE ) 350 MG/ML injection 85 mL (85 mLs Intravenous Contrast Given 03/22/23 1809)    _______________________________________________________ ER Provider Called:      Cardiology Dr.Bensimohn  They Recommend admit to medicine    SEEN in ER     ED Triage Vitals  Encounter Vitals Group     BP 03/22/23 1457 (!) 92/33     Systolic BP Percentile --      Diastolic BP  Percentile --      Pulse Rate 03/22/23 1457 61     Resp 03/22/23 1457 19     Temp 03/22/23 1457 (!) 97.3 F (36.3 C)     Temp  Source 03/22/23 2146 Oral     SpO2 03/22/23 1457 93 %     Weight 03/22/23 1446 230 lb (104.3 kg)     Height 03/22/23 1446 5' 6 (1.676 m)     Head Circumference --      Peak Flow --      Pain Score 03/22/23 1446 0     Pain Loc --      Pain Education --      Exclude from Growth Chart --   UFJK(75)@     _________________________________________ Significant initial  Findings: Abnormal Labs Reviewed  CBC WITH DIFFERENTIAL/PLATELET - Abnormal; Notable for the following components:      Result Value   Hemoglobin 12.4 (*)    Platelets 421 (*)    All other components within normal limits  COMPREHENSIVE METABOLIC PANEL - Abnormal; Notable for the following components:   CO2 19 (*)    Glucose, Bld 117 (*)    BUN 31 (*)    Creatinine, Ser 1.87 (*)    Alkaline Phosphatase 140 (*)    GFR, Estimated 36 (*)    Anion gap 17 (*)    All other components within normal limits  I-STAT VENOUS BLOOD GAS, ED - Abnormal; Notable for the following components:   pCO2, Ven 40.4 (*)    pO2, Ven 82 (*)    Acid-base deficit 3.0 (*)    HCT 33.0 (*)    Hemoglobin 11.2 (*)    All other components within normal limits  TROPONIN I (HIGH SENSITIVITY) - Abnormal; Notable for the following components:   Troponin I (High Sensitivity) 23 (*)    All other components within normal limits  TROPONIN I (HIGH SENSITIVITY) - Abnormal; Notable for the following components:   Troponin I (High Sensitivity) 28 (*)    All other components within normal limits      _________________________ Troponin  ordered Cardiac Panel (last 3 results) Recent Labs    03/22/23 0503 03/22/23 1652  TROPONINIHS 23* 28*     ECG: Ordered Personally reviewed and interpreted by me showing: HR : 62 Rhythm:  Paced  no evidence of ischemic changes QTC 426  BNP (last 3 results) Recent Labs     06/17/22 1625 11/04/22 1100 03/22/23 1503  BNP 29.6 39.9 60.3     COVID-19 Labs  No results for input(s): DDIMER, FERRITIN, LDH, CRP in the last 72 hours.  Lab Results  Component Value Date   SARSCOV2NAA NEGATIVE 03/22/2023   SARSCOV2NAA NEGATIVE 11/27/2020   SARSCOV2NAA NEGATIVE 08/23/2020   SARSCOV2NAA NEGATIVE 12/28/2019   ____________  The recent clinical data is shown below. Vitals:   03/22/23 1658 03/22/23 1749 03/22/23 1815 03/22/23 2146  BP: (!) 72/61 (!) 147/81 107/65 121/67  Pulse:  (!) 104 (!) 101 (!) 104  Resp:  16 13 15   Temp:    98.6 F (37 C)  TempSrc:    Oral  SpO2:  98% 99% 96%  Weight:      Height:        WBC     Component Value Date/Time   WBC 8.5 03/22/2023 0503   LYMPHSABS 1.8 03/22/2023 0503   LYMPHSABS 1.9 06/17/2022 1625   MONOABS 1.0 03/22/2023 0503   EOSABS 0.1 03/22/2023 0503   EOSABS 0.2 06/17/2022 1625   BASOSABS 0.0 03/22/2023 0503   BASOSABS 0.0 06/17/2022 1625     Lactic Acid, Venous    Component Value Date/Time   LATICACIDVEN 1.7 03/22/2023 2113     Procalcitonin  Ordered      UA  ordered     Results for orders placed or performed during the hospital encounter of 03/22/23  Resp panel by RT-PCR (RSV, Flu A&B, Covid) Anterior Nasal Swab     Status: None   Collection Time: 03/22/23  2:54 PM   Specimen: Anterior Nasal Swab  Result Value Ref Range Status   SARS Coronavirus 2 by RT PCR NEGATIVE NEGATIVE Final   Influenza A by PCR NEGATIVE NEGATIVE Final   Influenza B by PCR NEGATIVE NEGATIVE Final         Resp Syncytial Virus by PCR NEGATIVE NEGATIVE Final          ________________________________________________________________  Venous  Blood Gas result:  pH 7.345 Sodium 139 mmol/L   pCO2, Ven 40.4 Low  mmHg Potassium 3.9 mmol/L  pO2, Ven 82 High  mmHg      __________________________________________________________ Recent Labs  Lab 03/22/23 0503 03/22/23 2112  NA 139 139  K 4.0 3.9  CO2 19*  --    GLUCOSE 117*  --   BUN 31*  --   CREATININE 1.87*  --   CALCIUM  9.7  --     Cr   Up from baseline see below Lab Results  Component Value Date   CREATININE 1.87 (H) 03/22/2023   CREATININE 1.10 03/01/2023   CREATININE 1.53 (H) 02/17/2023    Recent Labs  Lab 03/22/23 0503  AST 26  ALT 18  ALKPHOS 140*  BILITOT 0.6  PROT 7.7  ALBUMIN 4.0   Lab Results  Component Value Date   CALCIUM  9.7 03/22/2023    Plt: Lab Results  Component Value Date   PLT 421 (H) 03/22/2023    Recent Labs  Lab 03/22/23 0503 03/22/23 2112  WBC 8.5  --   NEUTROABS 5.6  --   HGB 12.4* 11.2*  HCT 39.8 33.0*  MCV 86.7  --   PLT 421*  --     HG/HCT stable,       Component Value Date/Time   HGB 11.2 (L) 03/22/2023 2112   HGB 15.0 08/11/2022 1515   HCT 33.0 (L) 03/22/2023 2112   HCT 47.3 08/11/2022 1515   MCV 86.7 03/22/2023 0503   MCV 95 08/11/2022 1515    ______________________________________ Hospitalist was called for admission for   AKI   Exertional dyspnea    The following Work up has been ordered so far:  Orders Placed This Encounter  Procedures   Resp panel by RT-PCR (RSV, Flu A&B, Covid) Anterior Nasal Swab   DG Chest 2 View   CT Angio Chest/Abd/Pel for Dissection W and/or Wo Contrast   CBC with Differential   Comprehensive metabolic panel   Brain natriuretic peptide   APTT   ED Cardiac monitoring   Inpatient consult to Cardiology   Consult to hospitalist   POC occult blood, ED   I-stat Creatinine, ED   I-Stat venous blood gas, ([facility], [facility], [facility])   ED EKG   Type and screen [facility]   Insert peripheral IV     OTHER Significant initial  Findings:  labs showing:     DM  labs:  HbA1C: No results for input(s): HGBA1C in the last 8760 hours.     CBG (last 3)  No results for input(s): GLUCAP in the last 72 hours.        Cultures: No results found for: SDES, SPECREQUEST, CULT, REPTSTATUS   Radiological Exams on Admission: CT  Angio Chest/Abd/Pel  for Dissection W and/or Wo Contrast Result Date: 03/22/2023 CLINICAL DATA:  Shortness of breath and hypotension. Recent hip replacement surgery 4 weeks ago. History of aortic dissection. EXAM: CT ANGIOGRAPHY CHEST, ABDOMEN AND PELVIS TECHNIQUE: Non-contrast CT of the chest was initially obtained. Multidetector CT imaging through the chest, abdomen and pelvis was performed using the standard protocol during bolus administration of intravenous contrast. Multiplanar reconstructed images and MIPs were obtained and reviewed to evaluate the vascular anatomy. RADIATION DOSE REDUCTION: This exam was performed according to the departmental dose-optimization program which includes automated exposure control, adjustment of the mA and/or kV according to patient size and/or use of iterative reconstruction technique. CONTRAST:  85mL OMNIPAQUE  IOHEXOL  350 MG/ML SOLN COMPARISON:  Chest x-ray from same day. CT chest, abdomen, and pelvis dated March 03, 2022. FINDINGS: CTA CHEST FINDINGS Cardiovascular: Normal heart size. No pericardial effusion. Advanced aortic atherosclerosis again noted. Changes of prior TEVAR again noted with the graft extending from the aortic isthmus into the distal descending thoracic aorta. No aneurysm. Small focus of opacification within the dissection flap in the most distal descending thoracic aorta has decreased in size since the prior study. Unchanged left chest wall pacemaker. No central pulmonary embolism. Mediastinum/Nodes: No enlarged mediastinal, hilar, or axillary lymph nodes. Thyroid  gland, trachea, and esophagus demonstrate no significant findings. Lungs/Pleura: Lungs are clear. No pleural effusion or pneumothorax. Musculoskeletal: No chest wall abnormality. No acute or significant osseous findings. Chronic oblique fracture through the T8 anterior superior endplate. Review of the MIP images confirms the above findings. CTA ABDOMEN AND PELVIS FINDINGS VASCULAR Aorta:  Unchanged aneurysmal dilatation of the juxtarenal aorta measuring 5.5 cm. Prior EVAR of the infrarenal abdominal aortic aneurysm. The excluded aneurysm sac is unchanged in diameter of 5.5 cm. 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 The focal residual dissection in the descending thoracic aorta may provide collateral supply. The distal branches are patent. SMA: Patent without evidence of aneurysm, dissection, vasculitis or significant stenosis. Renals: Both renal arteries are patent without evidence of aneurysm, dissection, vasculitis, fibromuscular dysplasia or significant stenosis. IMA: Occluded at the origin. Reconstitutes via collateral flow distally. 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 Stable focal non flow-limiting dissection versus penetrating ulceration in the left common femoral artery. Veins: No obvious venous abnormality within the limitations of this arterial phase study. Review of the MIP images confirms the above findings. NON-VASCULAR Hepatobiliary: No focal liver abnormality. Layering tiny gallstones again noted. No gallbladder wall thickening or biliary dilatation. Pancreas: Unremarkable. No pancreatic ductal dilatation or surrounding inflammatory changes. Spleen: Normal in size without focal abnormality. Adrenals/Urinary Tract: Adrenal glands are unremarkable. Mild cortical thinning bilaterally again noted. Kidneys are otherwise normal, without renal calculi, solid lesion, or hydronephrosis. Bladder is mostly decompressed. Stomach/Bowel: Unchanged small hiatal hernia. Duodenal diverticulum again noted. No bowel wall thickening, distention, or surrounding inflammatory changes. Diffuse colonic diverticulosis. Normal appendix. Lymphatic: No enlarged abdominal or pelvic lymph nodes. Reproductive: Prostate is unremarkable. Other: No free fluid or pneumoperitoneum.  Musculoskeletal: No acute or significant osseous findings. Review of the MIP images confirms the above findings. IMPRESSION: Vascular: 1. No acute aortic syndrome. Prior EVAR of the thoracic aorta for dissection without evidence of complication. Small focus of opacification within the dissection flap in the most distal descending thoracic aorta has decreased in size since the prior study. 2. Prior EVAR of the abdominal aorta for aneurysm without evidence of complication. The treated/excluded  infrarenal abdominal aortic aneurysm remains stable in size. 3. 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 4. 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 5. 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 The focal residual dissection in the descending thoracic aorta may provide collateral supply. 6.  Aortic atherosclerosis (ICD10-I70.0). Nonvascular: 1. No acute intrathoracic or intra-abdominal process. 2. Unchanged cholelithiasis. Electronically Signed   By: Elsie ONEIDA Shoulder M.D.   On: 03/22/2023 18:43   DG Chest 2 View Result Date: 03/22/2023 CLINICAL DATA:  Shortness of breath EXAM: CHEST - 2 VIEW COMPARISON:  11/29/2020 FINDINGS: Left-sided pacing device as previously noted. No consolidation or effusion. Stable cardiomediastinal silhouette with extensive descending aortic stent graft. No pneumothorax IMPRESSION: No active cardiopulmonary disease. Electronically Signed   By: Luke Bun M.D.   On: 03/22/2023 16:33   _______________________________________________________________________________________________________ Latest  Blood pressure 121/67, pulse (!) 104, temperature 98.6 F (37 C), temperature source Oral, resp. rate 15, height 5' 6 (1.676 m), weight 104.3 kg, SpO2 96%.   Vitals  labs and radiology finding personally reviewed  Review of Systems:    Pertinent positives  include:   fatigue, dyspnea on exertion, Constitutional:  No weight loss, night sweats, Fevers, chills, weight loss  HEENT:  No headaches, Difficulty swallowing,Tooth/dental problems,Sore throat,  No sneezing, itching, ear ache, nasal congestion, post nasal drip,  Cardio-vascular:  No chest pain, Orthopnea, PND, anasarca, dizziness, palpitations.no Bilateral lower extremity swelling  GI:  No heartburn, indigestion, abdominal pain, nausea, vomiting, diarrhea, change in bowel habits, loss of appetite, melena, blood in stool, hematemesis Resp:  no shortness of breath at rest. No  No excess mucus, no productive cough, No non-productive cough, No coughing up of blood.No change in color of mucus.No wheezing. Skin:  no rash or lesions. No jaundice GU:  no dysuria, change in color of urine, no urgency or frequency. No straining to urinate.  No flank pain.  Musculoskeletal:  No joint pain or no joint swelling. No decreased range of motion. No back pain.  Psych:  No change in mood or affect. No depression or anxiety. No memory loss.  Neuro: no localizing neurological complaints, no tingling, no weakness, no double vision, no gait abnormality, no slurred speech, no confusion  All systems reviewed and apart from HOPI all are negative _______________________________________________________________________________________________ Past Medical History:   Past Medical History:  Diagnosis Date   AAA (abdominal aortic aneurysm) (HCC)    Arthritis    CAD (coronary artery disease)    CHF (congestive heart failure) (HCC)    COPD (chronic obstructive pulmonary disease) (HCC)    Diverticulosis    Dyspnea    W/ EXERTION    Dysrhythmia    Complete Heart block   has PPM   Gout    Gynecomastia    Hx of emphysema (HCC)    Hyperlipidemia    Hypertension    Lumbar spinal stenosis    Myocardial infarction (HCC)    OSA (obstructive sleep apnea)    Pedal edema    Pre-diabetes    Presence of  permanent cardiac pacemaker     Past Surgical History:  Procedure Laterality Date   ABDOMINAL AORTIC ANEURYSM REPAIR  2013 and 2018   BACK SURGERY  1992   L4-5 PLIF  done in [place] ,  Dr. Vanetta ?   CARDIAC CATHETERIZATION  04/2016   [place], [place]   CARDIOVERSION N/A 09/13/2022  Procedure: CARDIOVERSION;  Surgeon: Gardenia Led, DO;  Location: [facility];  Service: Cardiovascular;  Laterality: N/A;   ELBOW ARTHROSCOPY Left 2011   EYE SURGERY Bilateral 2024   cataract extraction   I & D EXTREMITY Right 08/23/2020   Procedure: IRRIGATION AND DEBRIDEMENT HAND;  Surgeon: Sebastian Lenis, MD;  Location[facility];  Service: Orthopedics;  Laterality: Right;  Wound VAC application   I & D EXTREMITY Right 08/26/2020   Procedure: IRRIGATION AND DEBRIDEMENT RIGHT HAND;  Surgeon: Elisabeth Craig RAMAN, MD;  Location: [facility] OR;  Service: Plastics;  Laterality: Right;   I & D EXTREMITY Right 08/29/2020   Procedure: INCISION AND DEBRIDEMENT, HAND;  Surgeon: Elisabeth Craig RAMAN, MD;  Location: [facility] OR;  Service: Plastics;  Laterality: Right;   NECK SURGERY  1991   ACDF  ? 1 level, patient can't remember, done in [place]    PACEMAKER IMPLANT N/A 11/28/2020   Procedure: PACEMAKER IMPLANT;  Surgeon: Inocencio Soyla Lunger, MD;  Location: [facility];  Service: Cardiovascular;  Laterality: N/A;   RIGHT/LEFT HEART CATH AND CORONARY ANGIOGRAPHY N/A 08/12/2022   Procedure: RIGHT/LEFT HEART CATH AND CORONARY ANGIOGRAPHY;  Surgeon: Wendel Lurena POUR, MD;  Location: [facility];  Service: Cardiovascular;  Laterality: N/A;   THORACIC AORTIC ENDOVASCULAR STENT GRAFT N/A 10/26/2017   Procedure: THORACIC AORTIC ENDOVASCULAR STENT GRAFT WITH ILIAC EXTENSIONS;  Surgeon: Oris Krystal FALCON, MD;  Location: [facility] OR;  Service: Vascular;  Laterality: N/A;  PATIENT WILL NEED SPINAL CORD DRAIN BY ANESTHESIA   TOTAL HIP ARTHROPLASTY Right 02/28/2023   Procedure: TOTAL HIP ARTHROPLASTY ANTERIOR APPROACH;  Surgeon:  Melodi Lerner, MD;  Location: [facility] OR[facility];  Service: Orthopedics;  Laterality: Right;   VASECTOMY  1976    Social History:  Ambulatory  cane,    reports that he quit smoking about 2 years ago. His smoking use included cigars and cigarettes. He started smoking about 61 years ago. He has a 14.7 pack-year smoking history. He has never used smokeless tobacco. He reports current alcohol  use. He reports that he does not use drugs.   Family History:   Family History  Problem Relation Age of Onset   Alzheimer's disease Mother    Heart attack Father    Leukemia Father    Hypertension Father    Gout Maternal Uncle    Hypertension Son    _________________________________________________________________________________________ Allergies: Allergies  Allergen Reactions   Atorvastatin      joint pain      Prior to Admission medications   Medication Sig Start Date End Date Taking? Authorizing Provider  acetaminophen  (TYLENOL ) 500 MG tablet Take 500 mg by mouth every 8 (eight) hours as needed for moderate pain.    [provider]  apixaban  (ELIQUIS ) 5 MG TABS tablet Take 1 tablet (5 mg total) by mouth 2 (two) times daily. 11/22/22   Sabharwal, Led, DO  clopidogrel  (PLAVIX ) 75 MG tablet Take 1 tablet by mouth once daily 01/03/23   Alvan Dorn FALCON, MD  Cyanocobalamin  ([employer] VITAMIN B-12) 5000 MCG SUBL Place 5,000 mcg under the tongue daily.    [provider]  ENTRESTO  24-26 MG Take 1 tablet by mouth twice daily 01/10/23   Alvan Dorn FALCON, MD  JARDIANCE  10 MG TABS tablet Take 1 tablet by mouth once daily 03/25/22   Alvan Dorn FALCON, MD  methocarbamol  (ROBAXIN ) 500 MG tablet Take 1 tablet (500 mg total) by mouth every 6 (six) hours as needed for muscle spasms. 03/01/23  Kristian Stabs, PA  metoprolol  succinate (TOPROL  XL) 25 MG 24 hr tablet Take 1 tablet (25 mg total) by mouth at bedtime. 09/14/22   Lesia Ozell Barter, PA-C  nitroGLYCERIN  (NITROSTAT ) 0.4 MG SL tablet  Place 1 tablet (0.4 mg total) under the tongue every 5 (five) minutes x 3 doses as needed for chest pain. 11/29/20   Strader, Laymon HERO, PA-C  ondansetron  (ZOFRAN ) 4 MG tablet Take 1 tablet (4 mg total) by mouth every 6 (six) hours as needed for nausea. 03/01/23   Kristian Stabs, PA  oxyCODONE  (OXY IR/ROXICODONE ) 5 MG immediate release tablet Take 1-2 tablets (5-10 mg total) by mouth every 8 (eight) hours as needed for severe pain (pain score 7-10). 03/01/23   Kristian Stabs, PA  potassium chloride  SA (KLOR-CON  M20) 20 MEQ tablet Take 1 tablet (20 mEq total) by mouth daily. 10/20/22   Alvan Dorn FALCON, MD  spironolactone  (ALDACTONE ) 25 MG tablet Take 1/2 (one-half) tablet by mouth once daily 11/22/22   Alvan Dorn FALCON, MD  torsemide  (DEMADEX ) 20 MG tablet Take 1 tablet by mouth once daily 02/04/23   Alvan Dorn FALCON, MD  traMADol  (ULTRAM ) 50 MG tablet Take 1 tablet (50 mg total) by mouth every 8 (eight) hours as needed for moderate pain (pain score 4-6). 03/01/23   Kristian Stabs, PA    ___________________________________________________________________________________________________ Physical Exam:    03/22/2023    9:46 PM 03/22/2023    6:15 PM 03/22/2023    5:49 PM  Vitals with BMI  Systolic 121 107 852  Diastolic 67 65 81  Pulse 104 101 104    1. General:  in No  Acute distress   Chronically ill   -appearing 2. Psychological: Alert and   Oriented 3. Head/ENT:  Dry Mucous Membranes                          Head Non traumatic, neck supple                        Poor Dentition 4. SKIN: decreased Skin turgor,  Skin clean Dry and intact no rash    5. Heart: Regular rate and rhythm no  Murmur, no Rub or gallop 6. Lungs: no wheezes or crackles   7. Abdomen: Soft,  non-tender, Non distended   obese  bowel sounds present 8. Lower extremities: no clubbing, cyanosis, no  edema 9. Neurologically Grossly intact, moving all 4 extremities equally  10. MSK: Normal range of motion     Chart has been reviewed  _________________________ _________  Assessment/Plan 78 y.o. male with medical history significant of HTN, HLD, chronic diastolic CHF, tobacco abuse, obesity, heart block sp pacemaker, CAD, COPD, OSA,    Admitted for   AKI   Exertional dyspnea    Present on Admission:  AKI (acute kidney injury) (HCC)  CHB (complete heart block) (HCC)  Chronic diastolic heart failure (HCC)  COPD GOLD 0 / still smoking   Hypertension  Morbid obesity due to excess calories (HCC)  Metabolic acidosis  CAD (coronary artery disease)  DOE (dyspnea on exertion)   AKI (acute kidney injury) (HCC) Obtain urine electrolytes gently rehydrate be mindful of history of CHF  CHB (complete heart block) (HCC) Status post pacemaker  Chronic diastolic heart failure (HCC) Appears to be euvolemic/dry.  Given hypotension hold Entresto  metoprolol  and spironolactone  hold torsemide   COPD GOLD 0 / still smoking  Currently does not seem to be contributing  Continue home medications  Hypertension Allow permissive hypertension  Morbid obesity due to excess calories (HCC) Contributing to comorbidity and complicating medical management  Body mass index is 37.12 kg/m.  Nutritional follow up as an out pt would be recommended   OSA on CPAP Chronic order CPAP  Metabolic acidosis Lactic acid unremarkable check UA for ketones Increased respiratory drive could be secondary to metabolic acidosis appears to be well compensated  CAD (coronary artery disease) Monitor given slightly elevated troponin Given dyspnea on exertion will repeat echo  DOE (dyspnea on exertion) Etiology somewhat unclear Will repeat echo Continue to cycle cardiac enzymes CTA negative for PE no pulmonary infiltrates respiratory panel unremarkable Given metabolic acidosis we will check for any presence of ketoacidosis as that can also drive increased respirations currently appears to be stable   Other plan as per  orders.  DVT prophylaxis:  Eliquis    Code Status:    Code Status: Prior FULL CODE  per patient   I had personally discussed CODE STATUS with patient and family  ACP   none    Family Communication:   Family   at  Bedside  plan of care was discussed   with  Wife,    Diet heart healthy   Disposition Plan:             To home once workup is complete and patient is stable   Following barriers for discharge:                             Dyspnea work up is complete                            Electrolytes corrected                                                          Will need consultants to evaluate patient prior to discharge       Consult Orders  (From admission, onward)           Start     Ordered   03/22/23 2119  Consult to hospitalist  Once       Provider:  (Not yet assigned)  Question Answer Comment  Place call to: [facility]   Reason for Consult Consult      03/22/23 2118                    Consults called: Cardiology aware   Admission status:  ED Disposition     ED Disposition  Admit   Condition  --   Comment  Hospital Area: [facility] [[id number]]  Level of Care: Progressive [102]  Admit to Progressive based on following criteria: CARDIOVASCULAR & THORACIC of moderate stability with acute coronary syndrome symptoms/low risk myocardial infarction/hypertensive urgency/arrhythmias/heart failure potentially compromising stability and stable post cardiovascular intervention patients.  May place patient in observation at [facility] or [facility] if equivalent level of care is available:: No  Covid Evaluation: Asymptomatic - no recent exposure (last 10 days) testing not required  Diagnosis: AKI (acute kidney injury) [facility]) [[id number]]  Admitting Physician: Bonifacio Pruden [[id number]]  Attending Physician: Kadon Andrus [[id number]]  Obs      Level of care progressive      Lab Results  Component Value Date   SARSCOV2NAA  NEGATIVE 03/22/2023     Precautions: admitted as   Covid Negative    Kasin Tonkinson 03/23/2023, 12:31 AM    [facility]     after 2 AM please page floor coverage PA If 7AM-7PM, please contact the day team taking care of the patient using [email or web address]

## 2023-03-22 NOTE — Assessment & Plan Note (Addendum)
Currently does not seem to be contributing Continue home medications

## 2023-03-23 ENCOUNTER — Observation Stay (HOSPITAL_COMMUNITY): Payer: PPO

## 2023-03-23 DIAGNOSIS — I10 Essential (primary) hypertension: Secondary | ICD-10-CM | POA: Diagnosis not present

## 2023-03-23 DIAGNOSIS — N179 Acute kidney failure, unspecified: Secondary | ICD-10-CM | POA: Diagnosis not present

## 2023-03-23 DIAGNOSIS — R0609 Other forms of dyspnea: Secondary | ICD-10-CM | POA: Diagnosis not present

## 2023-03-23 DIAGNOSIS — I442 Atrioventricular block, complete: Secondary | ICD-10-CM | POA: Diagnosis not present

## 2023-03-23 DIAGNOSIS — I5032 Chronic diastolic (congestive) heart failure: Secondary | ICD-10-CM | POA: Diagnosis not present

## 2023-03-23 LAB — URINALYSIS, COMPLETE (UACMP) WITH MICROSCOPIC
Bacteria, UA: NONE SEEN
Bilirubin Urine: NEGATIVE
Glucose, UA: 150 mg/dL — AB
Ketones, ur: NEGATIVE mg/dL
Leukocytes,Ua: NEGATIVE
Nitrite: NEGATIVE
Protein, ur: NEGATIVE mg/dL
Specific Gravity, Urine: >1.046 — ABNORMAL HIGH (ref 1.005–1.030)
pH: 5 (ref 5.0–8.0)

## 2023-03-23 LAB — IRON AND TIBC
Iron: 31 ug/dL — ABNORMAL LOW (ref 45–182)
Saturation Ratios: 7 % — ABNORMAL LOW (ref 17.9–39.5)
TIBC: 465 ug/dL — ABNORMAL HIGH (ref 250–450)
UIBC: 434 ug/dL

## 2023-03-23 LAB — CBC
HCT: 36.7 % — ABNORMAL LOW (ref 39.0–52.0)
HCT: 33.7 % — ABNORMAL LOW (ref 39.0–52.0)
Hemoglobin: 11.6 g/dL — ABNORMAL LOW (ref 13.0–17.0)
Hemoglobin: 10.4 g/dL — ABNORMAL LOW (ref 13.0–17.0)
MCH: 27.2 pg (ref 26.0–34.0)
MCH: 26.5 pg (ref 26.0–34.0)
MCHC: 31.6 g/dL (ref 30.0–36.0)
MCHC: 30.9 g/dL (ref 30.0–36.0)
MCV: 85.9 fL (ref 80.0–100.0)
MCV: 86 fL (ref 80.0–100.0)
Platelets: 373 10*3/uL (ref 150–400)
Platelets: 314 10*3/uL (ref 150–400)
RBC: 4.27 MIL/uL (ref 4.22–5.81)
RBC: 3.92 MIL/uL — ABNORMAL LOW (ref 4.22–5.81)
RDW: 14.9 % (ref 11.5–15.5)
RDW: 14.9 % (ref 11.5–15.5)
WBC: 8.3 10*3/uL (ref 4.0–10.5)
WBC: 6.8 10*3/uL (ref 4.0–10.5)
nRBC: 0 % (ref 0.0–0.2)
nRBC: 0 % (ref 0.0–0.2)

## 2023-03-23 LAB — RAPID URINE DRUG SCREEN, HOSP PERFORMED
Amphetamines: NOT DETECTED
Barbiturates: NOT DETECTED
Benzodiazepines: NOT DETECTED
Cocaine: NOT DETECTED
Opiates: NOT DETECTED
Tetrahydrocannabinol: NOT DETECTED

## 2023-03-23 LAB — BETA-HYDROXYBUTYRIC ACID: Beta-Hydroxybutyric Acid: 0.07 mmol/L (ref 0.05–0.27)

## 2023-03-23 LAB — COMPREHENSIVE METABOLIC PANEL
ALT: 15 U/L (ref 0–44)
AST: 16 U/L (ref 15–41)
Albumin: 3.3 g/dL — ABNORMAL LOW (ref 3.5–5.0)
Alkaline Phosphatase: 114 U/L (ref 38–126)
Anion gap: 12 (ref 5–15)
BUN: 31 mg/dL — ABNORMAL HIGH (ref 8–23)
CO2: 21 mmol/L — ABNORMAL LOW (ref 22–32)
Calcium: 8.8 mg/dL — ABNORMAL LOW (ref 8.9–10.3)
Chloride: 104 mmol/L (ref 98–111)
Creatinine, Ser: 1.9 mg/dL — ABNORMAL HIGH (ref 0.61–1.24)
GFR, Estimated: 36 mL/min — ABNORMAL LOW (ref >60)
Glucose, Bld: 130 mg/dL — ABNORMAL HIGH (ref 70–99)
Potassium: 3.9 mmol/L (ref 3.5–5.1)
Sodium: 137 mmol/L (ref 135–145)
Total Bilirubin: 0.6 mg/dL (ref 0.0–1.2)
Total Protein: 6.3 g/dL — ABNORMAL LOW (ref 6.5–8.1)

## 2023-03-23 LAB — BASIC METABOLIC PANEL
Anion gap: 14 (ref 5–15)
BUN: 31 mg/dL — ABNORMAL HIGH (ref 8–23)
CO2: 21 mmol/L — ABNORMAL LOW (ref 22–32)
Calcium: 9.6 mg/dL (ref 8.9–10.3)
Chloride: 105 mmol/L (ref 98–111)
Creatinine, Ser: 2.18 mg/dL — ABNORMAL HIGH (ref 0.61–1.24)
GFR, Estimated: 30 mL/min — ABNORMAL LOW (ref >60)
Glucose, Bld: 144 mg/dL — ABNORMAL HIGH (ref 70–99)
Potassium: 4 mmol/L (ref 3.5–5.1)
Sodium: 140 mmol/L (ref 135–145)

## 2023-03-23 LAB — FOLATE: Folate: 13.2 ng/mL (ref >5.9)

## 2023-03-23 LAB — OSMOLALITY, URINE: Osmolality, Ur: 508 mosm/kg (ref 300–900)

## 2023-03-23 LAB — ECHOCARDIOGRAM COMPLETE
AR max vel: 2.62 cm2
AV Area VTI: 2.42 cm2
AV Area mean vel: 2.14 cm2
AV Mean grad: 2 mm[Hg]
AV Peak grad: 3.2 mm[Hg]
Ao pk vel: 0.89 m/s
Area-P 1/2: 3.06 cm2
Height: 66 in
MV VTI: 1.58 cm2
S' Lateral: 2.7 cm
Single Plane A4C EF: 66.5 %
Weight: 3680 [oz_av]

## 2023-03-23 LAB — MAGNESIUM
Magnesium: 2.2 mg/dL (ref 1.7–2.4)
Magnesium: 2.1 mg/dL (ref 1.7–2.4)

## 2023-03-23 LAB — CK: Total CK: 55 U/L (ref 49–397)

## 2023-03-23 LAB — PROCALCITONIN: Procalcitonin: 0.1 ng/mL

## 2023-03-23 LAB — RETICULOCYTES
Immature Retic Fract: 24.2 % — ABNORMAL HIGH (ref 2.3–15.9)
RBC.: 4.27 MIL/uL (ref 4.22–5.81)
Retic Count, Absolute: 90.1 10*3/uL (ref 19.0–186.0)
Retic Ct Pct: 2.1 % (ref 0.4–3.1)

## 2023-03-23 LAB — ETHANOL: Alcohol, Ethyl (B): <10 mg/dL (ref <10)

## 2023-03-23 LAB — TSH: TSH: 2.03 u[IU]/mL (ref 0.350–4.500)

## 2023-03-23 LAB — TROPONIN I (HIGH SENSITIVITY): Troponin I (High Sensitivity): 26 ng/L — ABNORMAL HIGH (ref <18)

## 2023-03-23 LAB — OSMOLALITY: Osmolality: 306 mosm/kg — ABNORMAL HIGH (ref 275–295)

## 2023-03-23 LAB — FERRITIN: Ferritin: 45 ng/mL (ref 24–336)

## 2023-03-23 LAB — VITAMIN B12: Vitamin B-12: >7500 pg/mL — ABNORMAL HIGH (ref 180–914)

## 2023-03-23 LAB — PHOSPHORUS
Phosphorus: 3.8 mg/dL (ref 2.5–4.6)
Phosphorus: 4.3 mg/dL (ref 2.5–4.6)

## 2023-03-23 LAB — SODIUM, URINE, RANDOM: Sodium, Ur: 18 mmol/L

## 2023-03-23 LAB — CREATININE, URINE, RANDOM: Creatinine, Urine: 213 mg/dL

## 2023-03-23 MED ORDER — LEVALBUTEROL HCL 0.63 MG/3ML IN NEBU: 0.6300 mg | INHALATION_SOLUTION | Freq: Four times a day (QID) | RESPIRATORY_TRACT | Status: DC | PRN

## 2023-03-23 MED ORDER — PERFLUTREN LIPID MICROSPHERE
1.0000 mL | INTRAVENOUS | Status: AC | PRN
  Administered 2023-03-23: 4 mL via INTRAVENOUS

## 2023-03-23 MED ORDER — ONDANSETRON HCL 4 MG/2ML IJ SOLN
4.0000 mg | Freq: Four times a day (QID) | INTRAMUSCULAR | Status: DC | PRN
  Administered 2023-03-24: 4 mg via INTRAVENOUS
  Filled 2023-03-23: qty 2

## 2023-03-23 MED ORDER — SODIUM CHLORIDE 0.9 % IV SOLN: INTRAVENOUS | Status: AC

## 2023-03-23 MED ORDER — ACETAMINOPHEN 650 MG RE SUPP
650.0000 mg | Freq: Four times a day (QID) | RECTAL | Status: DC | PRN
Start: 2023-03-23 — End: 2023-03-25

## 2023-03-23 MED ORDER — POLYETHYLENE GLYCOL 3350 17 G PO PACK: 17.0000 g | PACK | Freq: Every day | ORAL | Status: DC | PRN

## 2023-03-23 MED ORDER — TRAMADOL HCL 50 MG PO TABS: 50.0000 mg | ORAL_TABLET | Freq: Three times a day (TID) | ORAL | Status: DC | PRN

## 2023-03-23 MED ORDER — SENNA 8.6 MG PO TABS
1.0000 | ORAL_TABLET | Freq: Two times a day (BID) | ORAL | Status: DC
  Administered 2023-03-23: 8.6 mg via ORAL
  Filled 2023-03-23 (×4): qty 1

## 2023-03-23 MED ORDER — APIXABAN 5 MG PO TABS
5.0000 mg | ORAL_TABLET | Freq: Two times a day (BID) | ORAL | Status: DC
  Administered 2023-03-23 – 2023-03-25 (×6): 5 mg via ORAL
  Filled 2023-03-23 (×6): qty 1

## 2023-03-23 MED ORDER — CLOPIDOGREL BISULFATE 75 MG PO TABS
75.0000 mg | ORAL_TABLET | Freq: Every day | ORAL | Status: DC
  Administered 2023-03-23 – 2023-03-25 (×3): 75 mg via ORAL
  Filled 2023-03-23 (×4): qty 1

## 2023-03-23 MED ORDER — ONDANSETRON HCL 4 MG PO TABS: 4.0000 mg | ORAL_TABLET | Freq: Four times a day (QID) | ORAL | Status: DC | PRN

## 2023-03-23 MED ORDER — ACETAMINOPHEN 325 MG PO TABS: 650.0000 mg | ORAL_TABLET | Freq: Four times a day (QID) | ORAL | Status: DC | PRN

## 2023-03-23 MED ORDER — GUAIFENESIN ER 600 MG PO TB12
600.0000 mg | ORAL_TABLET | Freq: Two times a day (BID) | ORAL | Status: DC
  Administered 2023-03-23 – 2023-03-25 (×6): 600 mg via ORAL
  Filled 2023-03-23 (×6): qty 1

## 2023-03-23 NOTE — Progress Notes (Signed)
 [facility]                                                                               Christpoher Sievers, is a 78 y.o. male, DOB - 06-12-1945, FMW:[id number] Admit date - 03/22/2023    Outpatient Primary MD for the patient is Rosan Jacquline NOVAK, NP  LOS - 0  days    Brief summary   Trevontae Lindahl. is a 78 y.o. male with medical history significant of HTN, HLD, chronic systolic/diastolic CHF, tobacco abuse, obesity, heart block sp pacemaker, CAD, COPD, OSA,  Presented with   dyspnea on exertion.  Patient underwent total hip arthroplasty on the right in January 2024. Patient was admitted for AKI, was hypotensive on admission. CTA is negative for PE.   Assessment & Plan    Assessment and Plan:  AKI Probably prerenal as patient has been hypotensive on admission. His creatinine peaked at 2 with slight improvement to 1.8 after getting IV fluids overnight. No signs of infection on urine analysis. Gently hydrate with IV fluids and recheck renal parameters in the morning. Patient does not appear to be fluid overloaded at this time. Hold Entresto  and nephrotoxins at this time.     Chronic diastolic heart failure Patient appears to be euvolemic.  Entresto  was held on admission given hypotension.   History of complete heart block s/p pacemaker.    History of coronary artery disease Elevated troponins on admission suspect from demand ischemia from hypotension Patient currently denies any chest pain Echocardiogram has been ordered and will follow.  History of COPD/ongoing tobacco use No wheezing heard on exam   Hypertension Blood pressure parameters have improved when compared to admission continue to monitor   .Body mass index is 37.12 kg/m. Obesity   Obstructive sleep apnea on CPAP  Mild normocytic anemia Hemoglobin around 10 No obvious signs of bleeding  Estimated body mass index is 37.12 kg/m as calculated from the following:   Height as  of this encounter: 5' 6 (1.676 m).   Weight as of this encounter: 104.3 kg.  Code Status: Full code DVT Prophylaxis:   apixaban  (ELIQUIS ) tablet 5 mg   Level of Care: Level of care: Progressive Family Communication: None at bed side  Disposition Plan:     Remains inpatient appropriate: Pending improvement in creatinine  Procedures:  CT angiogram  Consultants:   None  Antimicrobials:   Anti-infectives (From admission, onward)    None        Medications  Scheduled Meds:  apixaban   5 mg Oral BID   clopidogrel   75 mg Oral Daily   guaiFENesin   600 mg Oral BID   senna  1 tablet Oral BID   Continuous Infusions: PRN Meds:.acetaminophen  **OR** acetaminophen , levalbuterol , ondansetron  **OR** ondansetron  (ZOFRAN ) IV, perflutren  lipid microspheres (DEFINITY ) IV suspension, polyethylene glycol, traMADol     Subjective:   Norleen Rudder was seen and examined today.  Feeling a lot better.  No chest pain or sob.   Objective:   Vitals:   03/23/23 0943 03/23/23 1100 03/23/23 1202 03/23/23 1203  BP:  (!) 108/56 (!) 124/100   Pulse:   75   Resp: (!) 23 19  17 14  Temp: 98.2 F (36.8 C)     TempSrc:      SpO2: 98% 96% 100%   Weight:      Height:        Intake/Output Summary (Last 24 hours) at 03/23/2023 1209 Last data filed at 03/22/2023 1950 Gross per 24 hour  Intake 1000 ml  Output --  Net 1000 ml   Filed Weights   03/22/23 1446  Weight: 104.3 kg     Exam General exam: Appears calm and comfortable  Respiratory system: Clear to auscultation. Respiratory effort normal. Cardiovascular system: S1 & S2 heard, RRR. No JVD,  Gastrointestinal system: Abdomen is nondistended, soft and nontender.  Central nervous system: Alert and oriented.  Extremities: no pedal edema.  Skin: No rashes, Psychiatry: Mood & affect appropriate.     Data Reviewed:  I have personally reviewed following labs and imaging studies   CBC Lab Results  Component Value Date   WBC 6.8  03/23/2023   RBC 3.92 (L) 03/23/2023   HGB 10.4 (L) 03/23/2023   HCT 33.7 (L) 03/23/2023   MCV 86.0 03/23/2023   MCH 26.5 03/23/2023   PLT 314 03/23/2023   MCHC 30.9 03/23/2023   RDW 14.9 03/23/2023   LYMPHSABS 1.8 03/22/2023   MONOABS 1.0 03/22/2023   EOSABS 0.1 03/22/2023   BASOSABS 0.0 03/22/2023     Last metabolic panel Lab Results  Component Value Date   NA 137 03/23/2023   K 3.9 03/23/2023   CL 104 03/23/2023   CO2 21 (L) 03/23/2023   BUN 31 (H) 03/23/2023   CREATININE 1.90 (H) 03/23/2023   GLUCOSE 130 (H) 03/23/2023   GFRNONAA 36 (L) 03/23/2023   GFRAA 84 05/19/2020   CALCIUM  8.8 (L) 03/23/2023   PHOS 4.3 03/23/2023   PROT 6.3 (L) 03/23/2023   ALBUMIN 3.3 (L) 03/23/2023   BILITOT 0.6 03/23/2023   ALKPHOS 114 03/23/2023   AST 16 03/23/2023   ALT 15 03/23/2023   ANIONGAP 12 03/23/2023    CBG (last 3)  No results for input(s): GLUCAP in the last 72 hours.    Coagulation Profile: No results for input(s): INR, PROTIME in the last 168 hours.   Radiology Studies: CT Angio Chest/Abd/Pel for Dissection W and/or Wo Contrast Result Date: 03/22/2023 CLINICAL DATA:  Shortness of breath and hypotension. Recent hip replacement surgery 4 weeks ago. History of aortic dissection. EXAM: CT ANGIOGRAPHY CHEST, ABDOMEN AND PELVIS TECHNIQUE: Non-contrast CT of the chest was initially obtained. Multidetector CT imaging through the chest, abdomen and pelvis was performed using the standard protocol during bolus administration of intravenous contrast. Multiplanar reconstructed images and MIPs were obtained and reviewed to evaluate the vascular anatomy. RADIATION DOSE REDUCTION: This exam was performed according to the departmental dose-optimization program which includes automated exposure control, adjustment of the mA and/or kV according to patient size and/or use of iterative reconstruction technique. CONTRAST:  85mL OMNIPAQUE  IOHEXOL  350 MG/ML SOLN COMPARISON:  Chest x-ray from  same day. CT chest, abdomen, and pelvis dated March 03, 2022. FINDINGS: CTA CHEST FINDINGS Cardiovascular: Normal heart size. No pericardial effusion. Advanced aortic atherosclerosis again noted. Changes of prior TEVAR again noted with the graft extending from the aortic isthmus into the distal descending thoracic aorta. No aneurysm. Small focus of opacification within the dissection flap in the most distal descending thoracic aorta has decreased in size since the prior study. Unchanged left chest wall pacemaker. No central pulmonary embolism. Mediastinum/Nodes: No enlarged mediastinal, hilar, or axillary lymph nodes.  Thyroid  gland, trachea, and esophagus demonstrate no significant findings. Lungs/Pleura: Lungs are clear. No pleural effusion or pneumothorax. Musculoskeletal: No chest wall abnormality. No acute or significant osseous findings. Chronic oblique fracture through the T8 anterior superior endplate. Review of the MIP images confirms the above findings. 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 Prior EVAR of the infrarenal abdominal aortic aneurysm. The excluded aneurysm sac is unchanged in diameter of 5.5 cm. 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 The focal residual dissection in the descending thoracic aorta may provide collateral supply. The distal branches are patent. SMA: Patent without evidence of aneurysm, dissection, vasculitis or significant stenosis. Renals: Both renal arteries are patent without evidence of aneurysm, dissection, vasculitis, fibromuscular dysplasia or significant stenosis. IMA: Occluded at the origin. Reconstitutes via collateral flow distally. 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 Stable focal non flow-limiting dissection versus penetrating ulceration in the left common  femoral artery. Veins: No obvious venous abnormality within the limitations of this arterial phase study. Review of the MIP images confirms the above findings. NON-VASCULAR Hepatobiliary: No focal liver abnormality. Layering tiny gallstones again noted. No gallbladder wall thickening or biliary dilatation. Pancreas: Unremarkable. No pancreatic ductal dilatation or surrounding inflammatory changes. Spleen: Normal in size without focal abnormality. Adrenals/Urinary Tract: Adrenal glands are unremarkable. Mild cortical thinning bilaterally again noted. Kidneys are otherwise normal, without renal calculi, solid lesion, or hydronephrosis. Bladder is mostly decompressed. Stomach/Bowel: Unchanged small hiatal hernia. Duodenal diverticulum again noted. No bowel wall thickening, distention, or surrounding inflammatory changes. Diffuse colonic diverticulosis. Normal appendix. Lymphatic: No enlarged abdominal or pelvic lymph nodes. Reproductive: Prostate is unremarkable. Other: No free fluid or pneumoperitoneum. Musculoskeletal: No acute or significant osseous findings. Review of the MIP images confirms the above findings. IMPRESSION: Vascular: 1. No acute aortic syndrome. Prior EVAR of the thoracic aorta for dissection without evidence of complication. Small focus of opacification within the dissection flap in the most distal descending thoracic aorta has decreased in size since the prior study. 2. Prior EVAR of the abdominal aorta for aneurysm without evidence of complication. The treated/excluded infrarenal abdominal aortic aneurysm remains stable in size. 3. 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 4. 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 5. 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 The focal residual dissection in the descending thoracic aorta may provide collateral supply. 6.   Aortic atherosclerosis (ICD10-I70.0). Nonvascular: 1. No acute intrathoracic or intra-abdominal process. 2. Unchanged cholelithiasis. Electronically Signed   By: Elsie ONEIDA Shoulder M.D.   On: 03/22/2023 18:43   DG Chest 2 View Result Date: 03/22/2023 CLINICAL DATA:  Shortness of breath EXAM: CHEST - 2 VIEW COMPARISON:  11/29/2020 FINDINGS: Left-sided pacing device as previously noted. No consolidation or effusion. Stable cardiomediastinal silhouette with extensive descending aortic stent graft. No pneumothorax IMPRESSION: No active cardiopulmonary disease. Electronically Signed   By: Luke Bun M.D.   On: 03/22/2023 16:33       Elgie Butter M.D. [facility] 03/23/2023, 12:09 PM  Available via Epic secure chat 7am-7pm After 7 pm, please refer to night coverage provider listed on amion.

## 2023-03-23 NOTE — Assessment & Plan Note (Signed)
 Etiology somewhat unclear Will repeat echo Continue to cycle cardiac enzymes CTA negative for PE no pulmonary infiltrates respiratory panel unremarkable Given metabolic acidosis we will check for any presence of ketoacidosis as that can also drive increased respirations currently appears to be stable

## 2023-03-23 NOTE — ED Notes (Signed)
 Pt up and ambulating to bathroom. Pt had mild dyspnea on exertion, but tolerated well. O2 sat WNL upon returning to bed.

## 2023-03-23 NOTE — Progress Notes (Signed)
  Echocardiogram 2D Echocardiogram has been performed.  Anesa Fronek L [place] RDCS 03/23/2023, 11:51 AM

## 2023-03-24 DIAGNOSIS — J449 Chronic obstructive pulmonary disease, unspecified: Secondary | ICD-10-CM | POA: Diagnosis not present

## 2023-03-24 DIAGNOSIS — N179 Acute kidney failure, unspecified: Secondary | ICD-10-CM | POA: Diagnosis not present

## 2023-03-24 DIAGNOSIS — I5032 Chronic diastolic (congestive) heart failure: Secondary | ICD-10-CM | POA: Diagnosis not present

## 2023-03-24 DIAGNOSIS — I442 Atrioventricular block, complete: Secondary | ICD-10-CM | POA: Diagnosis not present

## 2023-03-24 LAB — CBC WITH DIFFERENTIAL/PLATELET
Abs Immature Granulocytes: 0.01 10*3/uL (ref 0.00–0.07)
Basophils Absolute: 0 10*3/uL (ref 0.0–0.1)
Basophils Relative: 1 %
Eosinophils Absolute: 0.2 10*3/uL (ref 0.0–0.5)
Eosinophils Relative: 4 %
HCT: 30.6 % — ABNORMAL LOW (ref 39.0–52.0)
Hemoglobin: 9.6 g/dL — ABNORMAL LOW (ref 13.0–17.0)
Immature Granulocytes: 0 %
Lymphocytes Relative: 28 %
Lymphs Abs: 1.5 10*3/uL (ref 0.7–4.0)
MCH: 26.6 pg (ref 26.0–34.0)
MCHC: 31.4 g/dL (ref 30.0–36.0)
MCV: 84.8 fL (ref 80.0–100.0)
Monocytes Absolute: 0.7 10*3/uL (ref 0.1–1.0)
Monocytes Relative: 13 %
Neutro Abs: 3 10*3/uL (ref 1.7–7.7)
Neutrophils Relative %: 54 %
Platelets: 259 10*3/uL (ref 150–400)
RBC: 3.61 MIL/uL — ABNORMAL LOW (ref 4.22–5.81)
RDW: 14.7 % (ref 11.5–15.5)
WBC: 5.5 10*3/uL (ref 4.0–10.5)
nRBC: 0 % (ref 0.0–0.2)

## 2023-03-24 LAB — BASIC METABOLIC PANEL
Anion gap: 9 (ref 5–15)
BUN: 24 mg/dL — ABNORMAL HIGH (ref 8–23)
CO2: 19 mmol/L — ABNORMAL LOW (ref 22–32)
Calcium: 8.4 mg/dL — ABNORMAL LOW (ref 8.9–10.3)
Chloride: 110 mmol/L (ref 98–111)
Creatinine, Ser: 1.22 mg/dL (ref 0.61–1.24)
GFR, Estimated: >60 mL/min (ref >60)
Glucose, Bld: 111 mg/dL — ABNORMAL HIGH (ref 70–99)
Potassium: 3.5 mmol/L (ref 3.5–5.1)
Sodium: 138 mmol/L (ref 135–145)

## 2023-03-24 MED ORDER — SACUBITRIL-VALSARTAN 24-26 MG PO TABS
1.0000 | ORAL_TABLET | Freq: Two times a day (BID) | ORAL | Status: DC
  Administered 2023-03-24: 1 via ORAL
  Filled 2023-03-24 (×3): qty 1

## 2023-03-24 MED ORDER — METOPROLOL SUCCINATE ER 25 MG PO TB24
12.5000 mg | ORAL_TABLET | Freq: Every day | ORAL | Status: DC
  Administered 2023-03-24: 12.5 mg via ORAL
  Filled 2023-03-24: qty 1

## 2023-03-24 MED ORDER — EMPAGLIFLOZIN 10 MG PO TABS
10.0000 mg | ORAL_TABLET | Freq: Every day | ORAL | Status: DC
  Administered 2023-03-24 – 2023-03-25 (×2): 10 mg via ORAL
  Filled 2023-03-24 (×4): qty 1

## 2023-03-24 NOTE — Plan of Care (Signed)

## 2023-03-24 NOTE — Discharge Instructions (Signed)

## 2023-03-24 NOTE — TOC CM/SW Note (Signed)
 Transition of Care [facility]) - Inpatient Brief Assessment   Patient Details  Name: Jonathan Garner. MRN: [id number] Date of Birth: 1945-03-18  [facility]) CM/SW Contact:    Roxie KANDICE Stain, RN Phone Number: 03/24/2023, 10:24 AM   Clinical Narrative:  Spoke to patient regarding transition needs.  Patient lives with wife and has PCP-confirmed.  No TOC needs at this time.   Transition of Care Asessment: Insurance and Status: Insurance coverage has been reviewed Patient has primary care physician: Yes Home environment has been reviewed: home with wife Prior level of function:: independent Prior/Current Home Services: No current home services Social Drivers of Health Review: SDOH reviewed no interventions necessary Readmission risk has been reviewed: Yes Transition of care needs: no transition of care needs at this time

## 2023-03-24 NOTE — Progress Notes (Signed)
 OT Cancellation Note  Patient Details Name: Jonathan Garner. MRN: [id number] DOB: May 17, 1945   Cancelled Treatment:    Reason Eval/Treat Not Completed: OT screened, no needs identified, will sign off.  Talked with pt and he reports being independent with ADLs and mobility from previous hip surgery.  Will defer OT eval at this time.   Hughie Melroy OTR/L 03/24/2023, 1:35 PM

## 2023-03-24 NOTE — Care Management Obs Status (Signed)
 MEDICARE OBSERVATION STATUS NOTIFICATION   Patient Details  Name: Jonathan Garner. MRN: [id number] Date of Birth: June 01, 1945   Medicare Observation Status Notification Given:  Yes    Jeani Mill, RN 03/24/2023, 9:58 AM

## 2023-03-24 NOTE — Progress Notes (Signed)
 PROGRESS NOTE        PATIENT DETAILS Name: Jonathan Garner. Age: 78 y.o. Sex: male Date of Birth: [birth year] Admit Date: 03/22/2023 Admitting Physician Blease Quiver, MD ERE:Ynqqfjw, Jacquline NOVAK, NP  Brief Summary: Patient is a 78 y.o.  male with history of HTN, HLD, chronic combined systolic/diastolic heart failure, complete heart block-s/p PPM placement, CAD, COPD, OSA-who presented with weakness/shortness of breath-found to have AKI and subsequently admitted to the hospitalist service.  Significant events: 2/4>> admit to [facility]  Significant studies: 2/4>> CT chest/abdomen/pelvis: No acute aortic syndrome prior EVAR 2/5>> TTE: EF appears normal to mildly decreased.  Significant microbiology data: 2/4>> COVID/influenza/RSV PCR: Negative  Procedures: None  Consults: None  Subjective: Lying comfortably in bed-denies any chest pain or shortness of breath.  Feels much better  Objective: Vitals: Blood pressure 128/62, pulse 92, temperature 98.8 F (37.1 C), temperature source Oral, resp. rate 20, height 5' 6 (1.676 m), weight 104.3 kg, SpO2 91%.   Exam: Gen Exam:Alert awake-not in any distress HEENT:atraumatic, normocephalic Chest: B/L clear to auscultation anteriorly CVS:S1S2 regular Abdomen:soft non tender, non distended Extremities:no edema Neurology: Non focal Skin: no rash  Pertinent Labs/Radiology:    Latest Ref Rng & Units 03/24/2023    4:33 AM 03/23/2023    3:33 AM 03/22/2023   11:21 PM  CBC  WBC 4.0 - 10.5 K/uL 5.5  6.8  8.3   Hemoglobin 13.0 - 17.0 g/dL 9.6  89.5  88.3   Hematocrit 39.0 - 52.0 % 30.6  33.7  36.7   Platelets 150 - 400 K/uL 259  314  373     Lab Results  Component Value Date   NA 138 03/24/2023   K 3.5 03/24/2023   CL 110 03/24/2023   CO2 19 (L) 03/24/2023      Assessment/Plan: Assessment and Plan: AKI Hemodynamically mediated AKI (recent right hip surgery-takes diuretics/Entresto )-has resolved with  supportive care. Stop IVF-restart Entresto /Jardiance -mobilize-repeat electrolytes tomorrow-is stable-likely home tomorrow morning.  Chronic combined systolic/diastolic heart failure Euvolemic Resume low-dose beta-blocker/Jardiance /Entresto  Recheck electrolytes tomorrow  PAF Telemetry monitoring Resume low-dose beta-blocker Remains on Eliquis   CAD-s/p remote PCI to LAD/RCA No anginal symptoms Continue Plavix   History of AAA-s/p EVAR  HTN BP stable Resuming low-dose metoprolol -Entresto -will follow and adjust  Complete heart block-s/p PPM implantation Telemetry monitoring  COPD Exacerbation Bronchodilators  OSA CPAP nightly  Debility/deconditioning Recent right hip surgery PT/OT eval  Class 2 Obesity: Estimated body mass index is 37.12 kg/m as calculated from the following:   Height as of this encounter: 5' 6 (1.676 m).   Weight as of this encounter: 104.3 kg.   Code status:   Code Status: Full Code   DVT Prophylaxis: apixaban  (ELIQUIS ) tablet 5 mg    Family Communication: Spouse at bedside   Disposition Plan: Status is: Observation The patient will require care spanning > 2 midnights and should be moved to inpatient because: Severity of illness   Planned Discharge Destination:Home health likely on 2/7-blood pressure and electrolytes stable-restarting some of his cardiac medications today.    Diet: Diet Order             Diet Heart Room service appropriate? Yes; Fluid consistency: Thin  Diet effective now                     Antimicrobial agents: Anti-infectives (From admission, onward)  None        MEDICATIONS: Scheduled Meds:  apixaban   5 mg Oral BID   clopidogrel   75 mg Oral Daily   empagliflozin   10 mg Oral Daily   guaiFENesin   600 mg Oral BID   metoprolol  succinate  12.5 mg Oral QHS   sacubitril -valsartan   1 tablet Oral BID   senna  1 tablet Oral BID   Continuous Infusions: PRN Meds:.acetaminophen  **OR** acetaminophen ,  levalbuterol , ondansetron  **OR** ondansetron  (ZOFRAN ) IV, polyethylene glycol, traMADol    I have personally reviewed following labs and imaging studies  LABORATORY DATA: CBC: Recent Labs  Lab 03/22/23 0503 03/22/23 2112 03/22/23 2321 03/23/23 0333 03/24/23 0433  WBC 8.5  --  8.3 6.8 5.5  NEUTROABS 5.6  --   --   --  3.0  HGB 12.4* 11.2* 11.6* 10.4* 9.6*  HCT 39.8 33.0* 36.7* 33.7* 30.6*  MCV 86.7  --  85.9 86.0 84.8  PLT 421*  --  373 314 259    Basic Metabolic Panel: Recent Labs  Lab 03/22/23 0503 03/22/23 2112 03/22/23 2321 03/23/23 0333 03/24/23 0433  NA 139 139 140 137 138  K 4.0 3.9 4.0 3.9 3.5  CL 103  --  105 104 110  CO2 19*  --  21* 21* 19*  GLUCOSE 117*  --  144* 130* 111*  BUN 31*  --  31* 31* 24*  CREATININE 1.87*  --  2.18* 1.90* 1.22  CALCIUM  9.7  --  9.6 8.8* 8.4*  MG  --   --  2.2 2.1  --   PHOS  --   --  3.8 4.3  --     GFR: Estimated Creatinine Clearance: 56.5 mL/min (by C-G formula based on SCr of 1.22 mg/dL).  Liver Function Tests: Recent Labs  Lab 03/22/23 0503 03/23/23 0333  AST 26 16  ALT 18 15  ALKPHOS 140* 114  BILITOT 0.6 0.6  PROT 7.7 6.3*  ALBUMIN 4.0 3.3*   No results for input(s): LIPASE, AMYLASE in the last 168 hours. No results for input(s): AMMONIA in the last 168 hours.  Coagulation Profile: No results for input(s): INR, PROTIME in the last 168 hours.  Cardiac Enzymes: Recent Labs  Lab 03/22/23 2321  CKTOTAL 55    BNP (last 3 results) No results for input(s): PROBNP in the last 8760 hours.  Lipid Profile: No results for input(s): CHOL, HDL, LDLCALC, TRIG, CHOLHDL, LDLDIRECT in the last 72 hours.  Thyroid  Function Tests: Recent Labs    03/22/23 2321  TSH 2.030    Anemia Panel: Recent Labs    03/22/23 2321 03/22/23 2325  VITAMINB12  --  >7,500*  FOLATE  --  13.2  FERRITIN 45  --   TIBC 465*  --   IRON 31*  --   RETICCTPCT 2.1  --     Urine analysis:    Component  Value Date/Time   COLORURINE YELLOW 03/23/2023 0619   APPEARANCEUR CLEAR 03/23/2023 0619   LABSPEC >1.046 (H) 03/23/2023 0619   PHURINE 5.0 03/23/2023 0619   GLUCOSEU 150 (A) 03/23/2023 0619   HGBUR SMALL (A) 03/23/2023 0619   BILIRUBINUR NEGATIVE 03/23/2023 0619   KETONESUR NEGATIVE 03/23/2023 0619   PROTEINUR NEGATIVE 03/23/2023 0619   NITRITE NEGATIVE 03/23/2023 0619   LEUKOCYTESUR NEGATIVE 03/23/2023 0619    Sepsis Labs: Lactic Acid, Venous    Component Value Date/Time   LATICACIDVEN 1.7 03/22/2023 2113    MICROBIOLOGY: Recent Results (from the past 240 hours)  Resp panel by  RT-PCR (RSV, Flu A&B, Covid) Anterior Nasal Swab     Status: None   Collection Time: 03/22/23  2:54 PM   Specimen: Anterior Nasal Swab  Result Value Ref Range Status   SARS Coronavirus 2 by RT PCR NEGATIVE NEGATIVE Final   Influenza A by PCR NEGATIVE NEGATIVE Final   Influenza B by PCR NEGATIVE NEGATIVE Final    Comment: (NOTE) The Xpert Xpress SARS-CoV-2/FLU/RSV plus assay is intended as an aid in the diagnosis of influenza from Nasopharyngeal swab specimens and should not be used as a sole basis for treatment. Nasal washings and aspirates are unacceptable for Xpert Xpress SARS-CoV-2/FLU/RSV testing.  Fact Sheet for Patients: bloggercourse.com  Fact Sheet for Healthcare Providers: seriousbroker.it  This test is not yet approved or cleared by the [place]  FDA and has been authorized for detection and/or diagnosis of SARS-CoV-2 by FDA under an Emergency Use Authorization (EUA). This EUA will remain in effect (meaning this test can be used) for the duration of the COVID-19 declaration under Section 564(b)(1) of the Act, 21 U.S.C. section 360bbb-3(b)(1), unless the authorization is terminated or revoked.     Resp Syncytial Virus by PCR NEGATIVE NEGATIVE Final    Comment: (NOTE) Fact Sheet for  Patients: bloggercourse.com  Fact Sheet for Healthcare Providers: seriousbroker.it  This test is not yet approved or cleared by the [place]  FDA and has been authorized for detection and/or diagnosis of SARS-CoV-2 by FDA under an Emergency Use Authorization (EUA). This EUA will remain in effect (meaning this test can be used) for the duration of the COVID-19 declaration under Section 564(b)(1) of the Act, 21 U.S.C. section 360bbb-3(b)(1), unless the authorization is terminated or revoked.  Performed at [facility], [address] [place], [place] [address]     RADIOLOGY STUDIES/RESULTS: ECHOCARDIOGRAM COMPLETE Result Date: 03/23/2023    ECHOCARDIOGRAM REPORT   Patient Name:   Jonathan Garner. Date of Exam: 03/23/2023 Medical Rec #:  [id number]            Height:       66.0 in Accession #:    [id number]           Weight:       230.0 lb Date of Birth:  Sep 17, 1945            BSA:          2.122 m Patient Age:    51 years             BP:           108/56 mmHg Patient Gender: M                    HR:           99 bpm. Exam Location:  Inpatient Procedure: 2D Echo, Cardiac Doppler, Color Doppler and Intracardiac            Opacification Agent Indications:    Dyspnea  History:        Patient has prior history of Echocardiogram examinations, most                 recent 07/22/2022. CAD, Pacemaker, COPD, Signs/Symptoms:Dyspnea;                 Risk Factors:Sleep Apnea and Former Smoker.  Sonographer:    Juanita Shaw Referring Phys: [id number] ANASTASSIA DOUTOVA IMPRESSIONS  1. Extremely poor acoustic windows, even with Definity  use. Overall LVEF appears normal to mildly decreased  Cannot fully evaluate regional wall motion.  2. Right ventricular systolic function is normal. The right ventricular size is mildly enlarged.  3. The mitral valve was not well visualized. No evidence of mitral valve regurgitation. No evidence of mitral stenosis.  4. The aortic  valve was not well visualized. Aortic valve regurgitation is not visualized. No aortic stenosis is present. FINDINGS  Left Ventricle: Extremely poor acoustic windows, even with Definity  use. Overall LVEF appears normal to mildly decreased Cannot fully evaluate regional wall motion. The left ventricular internal cavity size was normal in size. Right Ventricle: The right ventricular size is mildly enlarged. Right vetricular wall thickness was not assessed. Right ventricular systolic function is normal. Left Atrium: Left atrial size was normal in size. Right Atrium: Right atrial size was normal in size. Pericardium: There is no evidence of pericardial effusion. Mitral Valve: The mitral valve was not well visualized. No evidence of mitral valve regurgitation. No evidence of mitral valve stenosis. MV peak gradient, 4.8 mmHg. The mean mitral valve gradient is 3.0 mmHg. Aortic Valve: The aortic valve was not well visualized. Aortic valve regurgitation is not visualized. No aortic stenosis is present. Aortic valve mean gradient measures 2.0 mmHg. Aortic valve peak gradient measures 3.2 mmHg. Aortic valve area, by VTI measures 2.42 cm. Pulmonic Valve: The pulmonic valve was not assessed. Aorta: The aortic root and ascending aorta are structurally normal, with no evidence of dilitation. IAS/Shunts: The interatrial septum was not assessed.  LEFT VENTRICLE PLAX 2D LVIDd:         4.00 cm     Diastology LVIDs:         2.70 cm     LV e' medial:    16.10 cm/s LV PW:         1.00 cm     LV E/e' medial:  3.8 LV IVS:        1.10 cm     LV e' lateral:   6.99 cm/s LVOT diam:     2.00 cm     LV E/e' lateral: 8.9 LV SV:         34 LV SV Index:   16 LVOT Area:     3.14 cm  LV Volumes (MOD) LV vol d, MOD A4C: 97.2 ml LV vol s, MOD A4C: 32.6 ml LV SV MOD A4C:     97.2 ml RIGHT VENTRICLE RV S prime:     12.40 cm/s TAPSE (M-mode): 1.3 cm LEFT ATRIUM           Index LA diam:      3.20 cm 1.51 cm/m LA Vol (A2C): 33.0 ml 15.55 ml/m LA Vol  (A4C): 47.2 ml 22.24 ml/m  AORTIC VALVE                    PULMONIC VALVE AV Area (Vmax):    2.62 cm     PV Vmax:       0.45 m/s AV Area (Vmean):   2.14 cm     PV Peak grad:  0.8 mmHg AV Area (VTI):     2.42 cm AV Vmax:           88.80 cm/s AV Vmean:          64.900 cm/s AV VTI:            0.140 m AV Peak Grad:      3.2 mmHg AV Mean Grad:      2.0 mmHg LVOT Vmax:  74.00 cm/s LVOT Vmean:        44.300 cm/s LVOT VTI:          0.108 m LVOT/AV VTI ratio: 0.77  AORTA Ao Root diam: 3.50 cm Ao Asc diam:  3.90 cm MITRAL VALVE MV Area (PHT): 3.06 cm     SHUNTS MV Area VTI:   1.58 cm     Systemic VTI:  0.11 m MV Peak grad:  4.8 mmHg     Systemic Diam: 2.00 cm MV Mean grad:  3.0 mmHg MV Vmax:       1.10 m/s MV Vmean:      75.3 cm/s MV Decel Time: 248 msec MV E velocity: 61.90 cm/s MV A velocity: 105.00 cm/s MV E/A ratio:  0.59 Vina Gull MD Electronically signed by Vina Gull MD Signature Date/Time: 03/23/2023/2:56:54 PM    Final    CT Angio Chest/Abd/Pel for Dissection W and/or Wo Contrast Result Date: 03/22/2023 CLINICAL DATA:  Shortness of breath and hypotension. Recent hip replacement surgery 4 weeks ago. History of aortic dissection. EXAM: CT ANGIOGRAPHY CHEST, ABDOMEN AND PELVIS TECHNIQUE: Non-contrast CT of the chest was initially obtained. Multidetector CT imaging through the chest, abdomen and pelvis was performed using the standard protocol during bolus administration of intravenous contrast. Multiplanar reconstructed images and MIPs were obtained and reviewed to evaluate the vascular anatomy. RADIATION DOSE REDUCTION: This exam was performed according to the departmental dose-optimization program which includes automated exposure control, adjustment of the mA and/or kV according to patient size and/or use of iterative reconstruction technique. CONTRAST:  85mL OMNIPAQUE  IOHEXOL  350 MG/ML SOLN COMPARISON:  Chest x-ray from same day. CT chest, abdomen, and pelvis dated March 03, 2022. FINDINGS: CTA CHEST  FINDINGS Cardiovascular: Normal heart size. No pericardial effusion. Advanced aortic atherosclerosis again noted. Changes of prior TEVAR again noted with the graft extending from the aortic isthmus into the distal descending thoracic aorta. No aneurysm. Small focus of opacification within the dissection flap in the most distal descending thoracic aorta has decreased in size since the prior study. Unchanged left chest wall pacemaker. No central pulmonary embolism. Mediastinum/Nodes: No enlarged mediastinal, hilar, or axillary lymph nodes. Thyroid  gland, trachea, and esophagus demonstrate no significant findings. Lungs/Pleura: Lungs are clear. No pleural effusion or pneumothorax. Musculoskeletal: No chest wall abnormality. No acute or significant osseous findings. Chronic oblique fracture through the T8 anterior superior endplate. Review of the MIP images confirms the above findings. 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 Prior EVAR of the infrarenal abdominal aortic aneurysm. The excluded aneurysm sac is unchanged in diameter of 5.5 cm. 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 The focal residual dissection in the descending thoracic aorta may provide collateral supply. The distal branches are patent. SMA: Patent without evidence of aneurysm, dissection, vasculitis or significant stenosis. Renals: Both renal arteries are patent without evidence of aneurysm, dissection, vasculitis, fibromuscular dysplasia or significant stenosis. IMA: Occluded at the origin. Reconstitutes via collateral flow distally. 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 Stable focal non flow-limiting dissection versus penetrating ulceration in the left common femoral artery. Veins: No obvious venous abnormality within the limitations of this  arterial phase study. Review of the MIP images confirms the above findings. NON-VASCULAR Hepatobiliary: No focal liver abnormality. Layering tiny gallstones again noted. No gallbladder wall thickening or biliary dilatation. Pancreas: Unremarkable. No pancreatic ductal  dilatation or surrounding inflammatory changes. Spleen: Normal in size without focal abnormality. Adrenals/Urinary Tract: Adrenal glands are unremarkable. Mild cortical thinning bilaterally again noted. Kidneys are otherwise normal, without renal calculi, solid lesion, or hydronephrosis. Bladder is mostly decompressed. Stomach/Bowel: Unchanged small hiatal hernia. Duodenal diverticulum again noted. No bowel wall thickening, distention, or surrounding inflammatory changes. Diffuse colonic diverticulosis. Normal appendix. Lymphatic: No enlarged abdominal or pelvic lymph nodes. Reproductive: Prostate is unremarkable. Other: No free fluid or pneumoperitoneum. Musculoskeletal: No acute or significant osseous findings. Review of the MIP images confirms the above findings. IMPRESSION: Vascular: 1. No acute aortic syndrome. Prior EVAR of the thoracic aorta for dissection without evidence of complication. Small focus of opacification within the dissection flap in the most distal descending thoracic aorta has decreased in size since the prior study. 2. Prior EVAR of the abdominal aorta for aneurysm without evidence of complication. The treated/excluded infrarenal abdominal aortic aneurysm remains stable in size. 3. 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 4. 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 5. 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 The focal residual dissection in the descending thoracic aorta may provide collateral supply. 6.  Aortic atherosclerosis (ICD10-I70.0). Nonvascular: 1. No acute intrathoracic or  intra-abdominal process. 2. Unchanged cholelithiasis. Electronically Signed   By: Elsie ONEIDA Shoulder M.D.   On: 03/22/2023 18:43   DG Chest 2 View Result Date: 03/22/2023 CLINICAL DATA:  Shortness of breath EXAM: CHEST - 2 VIEW COMPARISON:  11/29/2020 FINDINGS: Left-sided pacing device as previously noted. No consolidation or effusion. Stable cardiomediastinal silhouette with extensive descending aortic stent graft. No pneumothorax IMPRESSION: No active cardiopulmonary disease. Electronically Signed   By: Luke Bun M.D.   On: 03/22/2023 16:33     LOS: 0 days   Donalda Applebaum, MD  [facility]    To contact the attending provider between 7A-7P or the covering provider during after hours 7P-7A, please log into the web site [email or web address] and access using universal [place] password for that web site. If you do not have the password, please call the hospital operator.  03/24/2023, 8:40 AM

## 2023-03-24 NOTE — Evaluation (Signed)
 Physical Therapy Brief Evaluation and Discharge Note Patient Details Name: Jonathan Garner. MRN: [id number] DOB: 08/25/1945 Today's Date: 03/24/2023   History of Present Illness  Patient is a 78 y.o. male who presented with weakness/shortness of breath-found to have AKI. Past medical history of HTN, HLD, chronic combined systolic/diastolic heart failure, complete heart block-s/p PPM placement, CAD, COPD, OSA.  Clinical Impression  Pt presents with admitting diagnosis above. Pt today was able to ambulate in hallway independently with no AD. PTA pt was Mod I was RW/SPC after recent R THA however reports that he was weaning himself off AD. Pt presents at or near baseline mobility. Pt has no further acute PT needs and will be signing off. Re consult PT if mobility status changes. Pt would benefit from continued mobility with mobility specialist during acute stay.        PT Assessment Patient does not need any further PT services  Assistance Needed at Discharge  PRN    Equipment Recommendations None recommended by PT  Recommendations for Other Services       Precautions/Restrictions Precautions Precautions: Fall Restrictions Weight Bearing Restrictions Per Provider Order: No        Mobility  Bed Mobility   Supine/Sidelying to sit: Independent Sit to supine/sidelying: Independent    Transfers Overall transfer level: Independent Equipment used: None Transfers: Sit to/from Stand Sit to Stand: Independent                Ambulation/Gait Ambulation/Gait assistance: Independent Gait Distance (Feet): 50 Feet Assistive device: None Gait Pattern/deviations: Decreased stride length, Step-through pattern Gait Speed: Pace WFL General Gait Details: steady, no loss of balance  Home Activity Instructions    Stairs            Modified Rankin (Stroke Patients Only)        Balance                          Pertinent Vitals/Pain PT - Brief Vital  Signs All Vital Signs Stable: Yes Pain Assessment Pain Assessment: No/denies pain     Home Living Family/patient expects to be discharged to:: Private residence Living Arrangements: Spouse/significant other Available Help at Discharge: Family;Available 24 hours/day Home Environment: Stairs to enter  [employer] of Steps: 2 Home Equipment: Cane - single Librarian, Academic (2 wheels)        Prior Function Level of Independence: Independent Comments: Recent R THA last month. PT reports weaning himself off the cane and RW this week.    UE/LE Assessment   UE ROM/Strength/Tone/Coordination: [facility]    LE ROM/Strength/Tone/Coordination: [facility]      Communication   Communication Communication: Hearing impairment Cueing Techniques: Verbal cues;Tactile cues     Cognition Overall Cognitive Status: Appears within functional limits for tasks assessed/performed       General Comments General comments (skin integrity, edema, etc.): VSS    Exercises     Assessment/Plan    PT Problem List         PT Visit Diagnosis Other abnormalities of gait and mobility (R26.89)    No Skilled PT Patient at baseline level of functioning;Patient is independent with all acitivity/mobility   Co-evaluation                AMPAC 6 Clicks Help needed turning from your back to your side while in a flat bed without using bedrails?: None Help needed moving from lying on your back to sitting on the side of  a flat bed without using bedrails?: None Help needed moving to and from a bed to a chair (including a wheelchair)?: None Help needed standing up from a chair using your arms (e.g., wheelchair or bedside chair)?: None Help needed to walk in hospital room?: None Help needed climbing 3-5 steps with a railing? : None 6 Click Score: 24      End of Session Equipment Utilized During Treatment: Gait belt Activity Tolerance: Patient tolerated treatment well Patient left: in bed;with call  bell/phone within reach;with family/visitor present Nurse Communication: Mobility status PT Visit Diagnosis: Other abnormalities of gait and mobility (R26.89)     Time: 8967-8958 PT Time Calculation (min) (ACUTE ONLY): 9 min  Charges:   PT Evaluation $PT Eval Low Complexity: 1 Low      Zaiyden Strozier B, PT, DPT Acute Rehab Services [phone]   Katena Petitjean  03/24/2023, 10:49 AM

## 2023-03-25 DIAGNOSIS — I2585 Chronic coronary microvascular dysfunction: Secondary | ICD-10-CM | POA: Diagnosis not present

## 2023-03-25 DIAGNOSIS — I5032 Chronic diastolic (congestive) heart failure: Secondary | ICD-10-CM | POA: Diagnosis not present

## 2023-03-25 DIAGNOSIS — N179 Acute kidney failure, unspecified: Secondary | ICD-10-CM | POA: Diagnosis not present

## 2023-03-25 DIAGNOSIS — J449 Chronic obstructive pulmonary disease, unspecified: Secondary | ICD-10-CM | POA: Diagnosis not present

## 2023-03-25 LAB — BASIC METABOLIC PANEL
Anion gap: 10 (ref 5–15)
BUN: 23 mg/dL (ref 8–23)
CO2: 18 mmol/L — ABNORMAL LOW (ref 22–32)
Calcium: 8.5 mg/dL — ABNORMAL LOW (ref 8.9–10.3)
Chloride: 109 mmol/L (ref 98–111)
Creatinine, Ser: 1.33 mg/dL — ABNORMAL HIGH (ref 0.61–1.24)
GFR, Estimated: 55 mL/min — ABNORMAL LOW (ref >60)
Glucose, Bld: 117 mg/dL — ABNORMAL HIGH (ref 70–99)
Potassium: 3.5 mmol/L (ref 3.5–5.1)
Sodium: 137 mmol/L (ref 135–145)

## 2023-03-25 MED ORDER — TORSEMIDE 20 MG PO TABS: 20.0000 mg | ORAL_TABLET | Freq: Every day | ORAL | Status: DC | PRN

## 2023-03-25 NOTE — Discharge Summary (Signed)
 PATIENT DETAILS Name: Jonathan Garner. Age: 78 y.o. Sex: male Date of Birth: [birth year] MRN: [id number]. Admitting Physician: Blease Quiver, MD ERE:Ynqqfjw, Jacquline NOVAK, NP  Admit Date: 03/22/2023 Discharge date: 03/25/2023  Recommendations for Outpatient Follow-up:  Follow up with PCP in 1-2 weeks Please obtain CMP/CBC in one week Please ensure follow-up with cardiology  Admitted From:  Home  Disposition: Home   Discharge Condition: good  CODE STATUS:   Code Status: Full Code   Diet recommendation:  Diet Order             Diet - low sodium heart healthy           Diet Heart Room service appropriate? Yes; Fluid consistency: Thin  Diet effective now                    Brief Summary: Patient is a 78 y.o.  male with history of HTN, HLD, chronic combined systolic/diastolic heart failure, complete heart block-s/p PPM placement, CAD, COPD, OSA-who presented with weakness/shortness of breath-found to have AKI and subsequently admitted to the hospitalist service.   Significant events: 2/4>> admit to [facility]   Significant studies: 2/4>> CT chest/abdomen/pelvis: No acute aortic syndrome prior EVAR 2/5>> TTE: EF appears normal to mildly decreased.   Significant microbiology data: 2/4>> COVID/influenza/RSV PCR: Negative   Procedures: None   Consults: None  Brief Hospital Course: AKI Hemodynamically mediated AKI (recent right hip surgery-takes diuretics/Entresto )-improved with supportive care-all IV fluids were discontinued-Entresto /Jardiance  was restarted yesterday-slight uptake in creatinine-blood pressure soft overnight.  Will hold Entresto  until patient has been evaluated by cardiology/PCP in the next 1-2 weeks-and only resume if BP/electrolytes acceptable   Chronic combined systolic/diastolic heart failure Euvolemic See above-holding Entresto  on discharge-PCP/cardiology to repeat electrolytes/calcium  weekly before resuming Change diuretics to as  needed-volume status is stable-no need for diuretics currently Continue Jardiance /metoprolol  on discharge-will need close monitoring by PCP/cardiology.     PAF Resume  beta-blocker Remains on Eliquis    CAD-s/p remote PCI to LAD/RCA No anginal symptoms Continue Plavix    History of AAA-s/p EVAR   HTN BP soft  Continue metoprolol -holding entresto    Complete heart block-s/p PPM implantation    COPD Exacerbation Bronchodilators   OSA CPAP nightly   Debility/deconditioning Recent right hip surgery PT/OT -no recommendations   Class 2 Obesity: Estimated body mass index is 37.12 kg/m as calculated from the following:   Height as of this encounter: 5' 6 (1.676 m).   Weight as of this encounter: 104.3 kg   Discharge Diagnoses:  Principal Problem:   AKI (acute kidney injury) (HCC) Active Problems:   Hypertension   Chronic diastolic heart failure (HCC)   COPD GOLD 0 / still smoking    DOE (dyspnea on exertion)   OSA on CPAP   Morbid obesity due to excess calories (HCC)   CHB (complete heart block) (HCC)   Metabolic acidosis   CAD (coronary artery disease)   Discharge Instructions:  Activity:  As tolerated with Full fall precautions use walker/cane & assistance as needed  Discharge Instructions     (HEART FAILURE PATIENTS) Call MD:  Anytime you have any of the following symptoms: 1) 3 pound weight gain in 24 hours or 5 pounds in 1 week 2) shortness of breath, with or without a dry hacking cough 3) swelling in the hands, feet or stomach 4) if you have to sleep on extra pillows at night in order to breathe.   Complete by: As directed    Call  MD for:  difficulty breathing, headache or visual disturbances   Complete by: As directed    Diet - low sodium heart healthy   Complete by: As directed    Discharge instructions   Complete by: As directed    Follow with Primary MD  Rosan Jacquline NOVAK, NP in 1-2 weeks  Please follow-up with cardiology-their office will call  you with a follow-up appointment.  Hold Entresto  and Aldactone  until seen by outpatient cardiology  Change Demadex  to as needed for leg edema as/3 pound weight gain  Continue rest of your medications as previous.  Please get a complete blood count and chemistry panel checked by your Primary MD at your next visit, and again as instructed by your Primary MD.  Get Medicines reviewed and adjusted: Please take all your medications with you for your next visit with your Primary MD  Laboratory/radiological data: Please request your Primary MD to go over all hospital tests and procedure/radiological results at the follow up, please ask your Primary MD to get all Hospital records sent to his/her office.  In some cases, they will be blood work, cultures and biopsy results pending at the time of your discharge. Please request that your primary care M.D. follows up on these results.  Also Note the following: If you experience worsening of your admission symptoms, develop shortness of breath, life threatening emergency, suicidal or homicidal thoughts you must seek medical attention immediately by calling 911 or calling your MD immediately  if symptoms less severe.  You must read complete instructions/literature along with all the possible adverse reactions/side effects for all the Medicines you take and that have been prescribed to you. Take any new Medicines after you have completely understood and accpet all the possible adverse reactions/side effects.   Do not drive when taking Pain medications or sleeping medications (Benzodaizepines)  Do not take more than prescribed Pain, Sleep and Anxiety Medications. It is not advisable to combine anxiety,sleep and pain medications without talking with your primary care practitioner  Special Instructions: If you have smoked or chewed Tobacco  in the last 2 yrs please stop smoking, stop any regular Alcohol   and or any Recreational drug use.  Wear Seat belts  while driving.  Please note: You were cared for by a hospitalist during your hospital stay. Once you are discharged, your primary care physician will handle any further medical issues. Please note that NO REFILLS for any discharge medications will be authorized once you are discharged, as it is imperative that you return to your primary care physician (or establish a relationship with a primary care physician if you do not have one) for your post hospital discharge needs so that they can reassess your need for medications and monitor your lab values.   Increase activity slowly   Complete by: As directed       Allergies as of 03/25/2023       Reactions   Atorvastatin     joint pain        Medication List     PAUSE taking these medications    Entresto  24-26 MG Wait to take this until your doctor or other care provider tells you to start again. Generic drug: sacubitril -valsartan  Take 1 tablet by mouth twice daily       STOP taking these medications    potassium chloride  SA 20 MEQ tablet Commonly known as: Klor-Con  M20   spironolactone  25 MG tablet Commonly known as: ALDACTONE        TAKE these  medications    apixaban  5 MG Tabs tablet Commonly known as: Eliquis  Take 1 tablet (5 mg total) by mouth 2 (two) times daily.   clopidogrel  75 MG tablet Commonly known as: PLAVIX  Take 1 tablet by mouth once daily   [employer] Vitamin B-12 5000 MCG Subl Generic drug: Cyanocobalamin  Place 5,000 mcg under the tongue daily.   Jardiance  10 MG Tabs tablet Generic drug: empagliflozin  Take 1 tablet by mouth once daily   methocarbamol  500 MG tablet Commonly known as: ROBAXIN  Take 1 tablet (500 mg total) by mouth every 6 (six) hours as needed for muscle spasms.   metoprolol  succinate 25 MG 24 hr tablet Commonly known as: Toprol  XL Take 1 tablet (25 mg total) by mouth at bedtime.   nitroGLYCERIN  0.4 MG SL tablet Commonly known as: NITROSTAT  Place 1 tablet (0.4 mg total) under the  tongue every 5 (five) minutes x 3 doses as needed for chest pain.   ondansetron  4 MG tablet Commonly known as: ZOFRAN  Take 1 tablet (4 mg total) by mouth every 6 (six) hours as needed for nausea.   oxyCODONE  5 MG immediate release tablet Commonly known as: Oxy IR/ROXICODONE  Take 1-2 tablets (5-10 mg total) by mouth every 8 (eight) hours as needed for severe pain (pain score 7-10).   torsemide  20 MG tablet Commonly known as: DEMADEX  Take 1 tablet (20 mg total) by mouth daily as needed (leg swelling, 3 lb weight gain). What changed:  when to take this reasons to take this   traMADol  50 MG tablet Commonly known as: ULTRAM  Take 1 tablet (50 mg total) by mouth every 8 (eight) hours as needed for moderate pain (pain score 4-6).        Follow-up Information     Rosan Jacquline NOVAK, NP. Schedule an appointment as soon as possible for a visit in 1 week(s).   Specialty: Nurse Practitioner Contact information: [address] [place] [address] [phone]         Alvan Dorn FALCON, MD Follow up.   Specialty: Cardiology Why: Office will call with date/time, If you dont hear from them,please give them a call Contact information: [address] [place] [address] [phone]                Allergies  Allergen Reactions   Atorvastatin      joint pain      Other Procedures/Studies: ECHOCARDIOGRAM COMPLETE Result Date: 03/23/2023    ECHOCARDIOGRAM REPORT   Patient Name:   Jonathan Garner. Date of Exam: 03/23/2023 Medical Rec #:  [id number]            Height:       66.0 in Accession #:    [id number]           Weight:       230.0 lb Date of Birth:  Jan 16, 1946            BSA:          2.122 m Patient Age:    6 years             BP:           108/56 mmHg Patient Gender: M                    HR:           99 bpm. Exam Location:  Inpatient Procedure: 2D Echo, Cardiac Doppler, Color Doppler and Intracardiac  Opacification Agent Indications:    Dyspnea   History:        Patient has prior history of Echocardiogram examinations, most                 recent 07/22/2022. CAD, Pacemaker, COPD, Signs/Symptoms:Dyspnea;                 Risk Factors:Sleep Apnea and Former Smoker.  Sonographer:    Juanita Shaw Referring Phys: [id number] ANASTASSIA DOUTOVA IMPRESSIONS  1. Extremely poor acoustic windows, even with Definity  use. Overall LVEF appears normal to mildly decreased Cannot fully evaluate regional wall motion.  2. Right ventricular systolic function is normal. The right ventricular size is mildly enlarged.  3. The mitral valve was not well visualized. No evidence of mitral valve regurgitation. No evidence of mitral stenosis.  4. The aortic valve was not well visualized. Aortic valve regurgitation is not visualized. No aortic stenosis is present. FINDINGS  Left Ventricle: Extremely poor acoustic windows, even with Definity  use. Overall LVEF appears normal to mildly decreased Cannot fully evaluate regional wall motion. The left ventricular internal cavity size was normal in size. Right Ventricle: The right ventricular size is mildly enlarged. Right vetricular wall thickness was not assessed. Right ventricular systolic function is normal. Left Atrium: Left atrial size was normal in size. Right Atrium: Right atrial size was normal in size. Pericardium: There is no evidence of pericardial effusion. Mitral Valve: The mitral valve was not well visualized. No evidence of mitral valve regurgitation. No evidence of mitral valve stenosis. MV peak gradient, 4.8 mmHg. The mean mitral valve gradient is 3.0 mmHg. Aortic Valve: The aortic valve was not well visualized. Aortic valve regurgitation is not visualized. No aortic stenosis is present. Aortic valve mean gradient measures 2.0 mmHg. Aortic valve peak gradient measures 3.2 mmHg. Aortic valve area, by VTI measures 2.42 cm. Pulmonic Valve: The pulmonic valve was not assessed. Aorta: The aortic root and ascending aorta are structurally  normal, with no evidence of dilitation. IAS/Shunts: The interatrial septum was not assessed.  LEFT VENTRICLE PLAX 2D LVIDd:         4.00 cm     Diastology LVIDs:         2.70 cm     LV e' medial:    16.10 cm/s LV PW:         1.00 cm     LV E/e' medial:  3.8 LV IVS:        1.10 cm     LV e' lateral:   6.99 cm/s LVOT diam:     2.00 cm     LV E/e' lateral: 8.9 LV SV:         34 LV SV Index:   16 LVOT Area:     3.14 cm  LV Volumes (MOD) LV vol d, MOD A4C: 97.2 ml LV vol s, MOD A4C: 32.6 ml LV SV MOD A4C:     97.2 ml RIGHT VENTRICLE RV S prime:     12.40 cm/s TAPSE (M-mode): 1.3 cm LEFT ATRIUM           Index LA diam:      3.20 cm 1.51 cm/m LA Vol (A2C): 33.0 ml 15.55 ml/m LA Vol (A4C): 47.2 ml 22.24 ml/m  AORTIC VALVE                    PULMONIC VALVE AV Area (Vmax):    2.62 cm     PV Vmax:  0.45 m/s AV Area (Vmean):   2.14 cm     PV Peak grad:  0.8 mmHg AV Area (VTI):     2.42 cm AV Vmax:           88.80 cm/s AV Vmean:          64.900 cm/s AV VTI:            0.140 m AV Peak Grad:      3.2 mmHg AV Mean Grad:      2.0 mmHg LVOT Vmax:         74.00 cm/s LVOT Vmean:        44.300 cm/s LVOT VTI:          0.108 m LVOT/AV VTI ratio: 0.77  AORTA Ao Root diam: 3.50 cm Ao Asc diam:  3.90 cm MITRAL VALVE MV Area (PHT): 3.06 cm     SHUNTS MV Area VTI:   1.58 cm     Systemic VTI:  0.11 m MV Peak grad:  4.8 mmHg     Systemic Diam: 2.00 cm MV Mean grad:  3.0 mmHg MV Vmax:       1.10 m/s MV Vmean:      75.3 cm/s MV Decel Time: 248 msec MV E velocity: 61.90 cm/s MV A velocity: 105.00 cm/s MV E/A ratio:  0.59 Vina Gull MD Electronically signed by Vina Gull MD Signature Date/Time: 03/23/2023/2:56:54 PM    Final    CT Angio Chest/Abd/Pel for Dissection W and/or Wo Contrast Result Date: 03/22/2023 CLINICAL DATA:  Shortness of breath and hypotension. Recent hip replacement surgery 4 weeks ago. History of aortic dissection. EXAM: CT ANGIOGRAPHY CHEST, ABDOMEN AND PELVIS TECHNIQUE: Non-contrast CT of the chest was initially  obtained. Multidetector CT imaging through the chest, abdomen and pelvis was performed using the standard protocol during bolus administration of intravenous contrast. Multiplanar reconstructed images and MIPs were obtained and reviewed to evaluate the vascular anatomy. RADIATION DOSE REDUCTION: This exam was performed according to the departmental dose-optimization program which includes automated exposure control, adjustment of the mA and/or kV according to patient size and/or use of iterative reconstruction technique. CONTRAST:  85mL OMNIPAQUE  IOHEXOL  350 MG/ML SOLN COMPARISON:  Chest x-ray from same day. CT chest, abdomen, and pelvis dated March 03, 2022. FINDINGS: CTA CHEST FINDINGS Cardiovascular: Normal heart size. No pericardial effusion. Advanced aortic atherosclerosis again noted. Changes of prior TEVAR again noted with the graft extending from the aortic isthmus into the distal descending thoracic aorta. No aneurysm. Small focus of opacification within the dissection flap in the most distal descending thoracic aorta has decreased in size since the prior study. Unchanged left chest wall pacemaker. No central pulmonary embolism. Mediastinum/Nodes: No enlarged mediastinal, hilar, or axillary lymph nodes. Thyroid  gland, trachea, and esophagus demonstrate no significant findings. Lungs/Pleura: Lungs are clear. No pleural effusion or pneumothorax. Musculoskeletal: No chest wall abnormality. No acute or significant osseous findings. Chronic oblique fracture through the T8 anterior superior endplate. Review of the MIP images confirms the above findings. 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 Prior EVAR of the infrarenal abdominal aortic aneurysm. The excluded aneurysm sac is unchanged in diameter of 5.5 cm. 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 The focal  residual dissection in the descending thoracic aorta may provide collateral supply. The distal branches are patent. SMA: Patent without evidence of aneurysm, dissection, vasculitis or significant stenosis. Renals:  Both renal arteries are patent without evidence of aneurysm, dissection, vasculitis, fibromuscular dysplasia or significant stenosis. IMA: Occluded at the origin. Reconstitutes via collateral flow distally. 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 Stable focal non flow-limiting dissection versus penetrating ulceration in the left common femoral artery. Veins: No obvious venous abnormality within the limitations of this arterial phase study. Review of the MIP images confirms the above findings. NON-VASCULAR Hepatobiliary: No focal liver abnormality. Layering tiny gallstones again noted. No gallbladder wall thickening or biliary dilatation. Pancreas: Unremarkable. No pancreatic ductal dilatation or surrounding inflammatory changes. Spleen: Normal in size without focal abnormality. Adrenals/Urinary Tract: Adrenal glands are unremarkable. Mild cortical thinning bilaterally again noted. Kidneys are otherwise normal, without renal calculi, solid lesion, or hydronephrosis. Bladder is mostly decompressed. Stomach/Bowel: Unchanged small hiatal hernia. Duodenal diverticulum again noted. No bowel wall thickening, distention, or surrounding inflammatory changes. Diffuse colonic diverticulosis. Normal appendix. Lymphatic: No enlarged abdominal or pelvic lymph nodes. Reproductive: Prostate is unremarkable. Other: No free fluid or pneumoperitoneum. Musculoskeletal: No acute or significant osseous findings. Review of the MIP images confirms the above findings. IMPRESSION: Vascular: 1. No acute aortic syndrome. Prior EVAR of the thoracic aorta for dissection without evidence of complication. Small focus of opacification within the dissection flap in the most distal  descending thoracic aorta has decreased in size since the prior study. 2. Prior EVAR of the abdominal aorta for aneurysm without evidence of complication. The treated/excluded infrarenal abdominal aortic aneurysm remains stable in size. 3. 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 4. 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 5. 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 The focal residual dissection in the descending thoracic aorta may provide collateral supply. 6.  Aortic atherosclerosis (ICD10-I70.0). Nonvascular: 1. No acute intrathoracic or intra-abdominal process. 2. Unchanged cholelithiasis. Electronically Signed   By: Elsie ONEIDA Shoulder M.D.   On: 03/22/2023 18:43   DG Chest 2 View Result Date: 03/22/2023 CLINICAL DATA:  Shortness of breath EXAM: CHEST - 2 VIEW COMPARISON:  11/29/2020 FINDINGS: Left-sided pacing device as previously noted. No consolidation or effusion. Stable cardiomediastinal silhouette with extensive descending aortic stent graft. No pneumothorax IMPRESSION: No active cardiopulmonary disease. Electronically Signed   By: Luke Bun M.D.   On: 03/22/2023 16:33   CUP PACEART REMOTE DEVICE CHECK Result Date: 03/07/2023 Scheduled remote reviewed. Normal device function.  Next remote 91 days. - CS, [facility]  DG Pelvis Portable Result Date: 02/28/2023 CLINICAL DATA:  Right hip replacement. EXAM: PORTABLE PELVIS 1-2 VIEWS COMPARISON:  None Available. FINDINGS: Right hip arthroplasty in expected alignment. No periprosthetic lucency or fracture. Recent postsurgical change includes air and edema in the soft tissues. IMPRESSION: Right hip arthroplasty without immediate postoperative complication. Electronically Signed   By: Andrea Gasman M.D.   On: 02/28/2023 15:02   DG HIP UNILAT WITH PELVIS 1V RIGHT Result Date: 02/28/2023 CLINICAL DATA:  Right hip  arthroplasty. EXAM: DG HIP (WITH OR WITHOUT PELVIS) 1V RIGHT COMPARISON:  None Available. FINDINGS: Two fluoroscopic spot views of the pelvis and right hip obtained in the operating room. Sequential images during hip arthroplasty. Fluoroscopy time 42 seconds. Dose 1.648 mGy. IMPRESSION: Intraoperative fluoroscopy during right hip arthroplasty. Electronically Signed   By: Andrea Gasman M.D.   On: 02/28/2023 14:57   DG C-Arm 1-60 Min-No Report Result Date: 02/28/2023 Fluoroscopy was utilized by the requesting  physician.  No radiographic interpretation.     TODAY-DAY OF DISCHARGE:  Subjective:   Jonathan Garner today has no headache,no chest abdominal pain,no new weakness tingling or numbness, feels much better wants to go home today.  Objective:   Blood pressure 120/65, pulse 62, temperature 97.7 F (36.5 C), temperature source Oral, resp. rate 15, height 5' 6 (1.676 m), weight 104.3 kg, SpO2 94%.  Intake/Output Summary (Last 24 hours) at 03/25/2023 0916 Last data filed at 03/25/2023 0404 Gross per 24 hour  Intake --  Output 650 ml  Net -650 ml   Filed Weights   03/22/23 1446  Weight: 104.3 kg    Exam: Awake Alert, Oriented *3, No new F.N deficits, Normal affect [place].AT,PERRAL Supple Neck,No JVD, No cervical lymphadenopathy appriciated.  Symmetrical Chest wall movement, Good air movement bilaterally, CTAB RRR,No Gallops,Rubs or new Murmurs, No Parasternal Heave +ve B.Sounds, Abd Soft, Non tender, No organomegaly appriciated, No rebound -guarding or rigidity. No Cyanosis, Clubbing or edema, No new Rash or bruise   PERTINENT RADIOLOGIC STUDIES: ECHOCARDIOGRAM COMPLETE Result Date: 03/23/2023    ECHOCARDIOGRAM REPORT   Patient Name:   Jonathan Garner. Date of Exam: 03/23/2023 Medical Rec #:  [id number]            Height:       66.0 in Accession #:    [id number]           Weight:       230.0 lb Date of Birth:  [birth year]            BSA:          2.122 m Patient Age:    67 years              BP:           108/56 mmHg Patient Gender: M                    HR:           99 bpm. Exam Location:  Inpatient Procedure: 2D Echo, Cardiac Doppler, Color Doppler and Intracardiac            Opacification Agent Indications:    Dyspnea  History:        Patient has prior history of Echocardiogram examinations, most                 recent 07/22/2022. CAD, Pacemaker, COPD, Signs/Symptoms:Dyspnea;                 Risk Factors:Sleep Apnea and Former Smoker.  Sonographer:    Juanita Shaw Referring Phys: [id number] ANASTASSIA DOUTOVA IMPRESSIONS  1. Extremely poor acoustic windows, even with Definity  use. Overall LVEF appears normal to mildly decreased Cannot fully evaluate regional wall motion.  2. Right ventricular systolic function is normal. The right ventricular size is mildly enlarged.  3. The mitral valve was not well visualized. No evidence of mitral valve regurgitation. No evidence of mitral stenosis.  4. The aortic valve was not well visualized. Aortic valve regurgitation is not visualized. No aortic stenosis is present. FINDINGS  Left Ventricle: Extremely poor acoustic windows, even with Definity  use. Overall LVEF appears normal to mildly decreased Cannot fully evaluate regional wall motion. The left ventricular internal cavity size was normal in size. Right Ventricle: The right ventricular size is mildly enlarged. Right vetricular wall thickness was not assessed. Right ventricular systolic function is normal. Left Atrium: Left atrial size was normal in size. Right Atrium:  Right atrial size was normal in size. Pericardium: There is no evidence of pericardial effusion. Mitral Valve: The mitral valve was not well visualized. No evidence of mitral valve regurgitation. No evidence of mitral valve stenosis. MV peak gradient, 4.8 mmHg. The mean mitral valve gradient is 3.0 mmHg. Aortic Valve: The aortic valve was not well visualized. Aortic valve regurgitation is not visualized. No aortic stenosis is present. Aortic valve  mean gradient measures 2.0 mmHg. Aortic valve peak gradient measures 3.2 mmHg. Aortic valve area, by VTI measures 2.42 cm. Pulmonic Valve: The pulmonic valve was not assessed. Aorta: The aortic root and ascending aorta are structurally normal, with no evidence of dilitation. IAS/Shunts: The interatrial septum was not assessed.  LEFT VENTRICLE PLAX 2D LVIDd:         4.00 cm     Diastology LVIDs:         2.70 cm     LV e' medial:    16.10 cm/s LV PW:         1.00 cm     LV E/e' medial:  3.8 LV IVS:        1.10 cm     LV e' lateral:   6.99 cm/s LVOT diam:     2.00 cm     LV E/e' lateral: 8.9 LV SV:         34 LV SV Index:   16 LVOT Area:     3.14 cm  LV Volumes (MOD) LV vol d, MOD A4C: 97.2 ml LV vol s, MOD A4C: 32.6 ml LV SV MOD A4C:     97.2 ml RIGHT VENTRICLE RV S prime:     12.40 cm/s TAPSE (M-mode): 1.3 cm LEFT ATRIUM           Index LA diam:      3.20 cm 1.51 cm/m LA Vol (A2C): 33.0 ml 15.55 ml/m LA Vol (A4C): 47.2 ml 22.24 ml/m  AORTIC VALVE                    PULMONIC VALVE AV Area (Vmax):    2.62 cm     PV Vmax:       0.45 m/s AV Area (Vmean):   2.14 cm     PV Peak grad:  0.8 mmHg AV Area (VTI):     2.42 cm AV Vmax:           88.80 cm/s AV Vmean:          64.900 cm/s AV VTI:            0.140 m AV Peak Grad:      3.2 mmHg AV Mean Grad:      2.0 mmHg LVOT Vmax:         74.00 cm/s LVOT Vmean:        44.300 cm/s LVOT VTI:          0.108 m LVOT/AV VTI ratio: 0.77  AORTA Ao Root diam: 3.50 cm Ao Asc diam:  3.90 cm MITRAL VALVE MV Area (PHT): 3.06 cm     SHUNTS MV Area VTI:   1.58 cm     Systemic VTI:  0.11 m MV Peak grad:  4.8 mmHg     Systemic Diam: 2.00 cm MV Mean grad:  3.0 mmHg MV Vmax:       1.10 m/s MV Vmean:      75.3 cm/s MV Decel Time: 248 msec MV E velocity: 61.90 cm/s MV A velocity: 105.00 cm/s  MV E/A ratio:  0.59 Vina Gull MD Electronically signed by Vina Gull MD Signature Date/Time: 03/23/2023/2:56:54 PM    Final      PERTINENT LAB RESULTS: CBC: Recent Labs    03/23/23 0333  03/24/23 0433  WBC 6.8 5.5  HGB 10.4* 9.6*  HCT 33.7* 30.6*  PLT 314 259   CMET CMP     Component Value Date/Time   NA 137 03/25/2023 0535   NA 143 08/11/2022 1515   K 3.5 03/25/2023 0535   CL 109 03/25/2023 0535   CO2 18 (L) 03/25/2023 0535   GLUCOSE 117 (H) 03/25/2023 0535   BUN 23 03/25/2023 0535   BUN 31 (H) 08/11/2022 1515   CREATININE 1.33 (H) 03/25/2023 0535   CREATININE 1.01 05/19/2020 1433   CALCIUM  8.5 (L) 03/25/2023 0535   PROT 6.3 (L) 03/23/2023 0333   ALBUMIN 3.3 (L) 03/23/2023 0333   AST 16 03/23/2023 0333   ALT 15 03/23/2023 0333   ALKPHOS 114 03/23/2023 0333   BILITOT 0.6 03/23/2023 0333   EGFR 53 (L) 08/11/2022 1515   GFRNONAA 55 (L) 03/25/2023 0535   GFRNONAA 72 05/19/2020 1433    GFR Estimated Creatinine Clearance: 51.8 mL/min (A) (by C-G formula based on SCr of 1.33 mg/dL (H)). No results for input(s): LIPASE, AMYLASE in the last 72 hours. Recent Labs    03/22/23 2321  CKTOTAL 55   Invalid input(s): POCBNP No results for input(s): DDIMER in the last 72 hours. No results for input(s): HGBA1C in the last 72 hours. No results for input(s): CHOL, HDL, LDLCALC, TRIG, CHOLHDL, LDLDIRECT in the last 72 hours. Recent Labs    03/22/23 2321  TSH 2.030   Recent Labs    03/22/23 2321 03/22/23 2325  VITAMINB12  --  >7,500*  FOLATE  --  13.2  FERRITIN 45  --   TIBC 465*  --   IRON 31*  --   RETICCTPCT 2.1  --    Coags: No results for input(s): INR in the last 72 hours.  Invalid input(s): PT Microbiology: Recent Results (from the past 240 hours)  Resp panel by RT-PCR (RSV, Flu A&B, Covid) Anterior Nasal Swab     Status: None   Collection Time: 03/22/23  2:54 PM   Specimen: Anterior Nasal Swab  Result Value Ref Range Status   SARS Coronavirus 2 by RT PCR NEGATIVE NEGATIVE Final   Influenza A by PCR NEGATIVE NEGATIVE Final   Influenza B by PCR NEGATIVE NEGATIVE Final    Comment: (NOTE) The Xpert Xpress  SARS-CoV-2/FLU/RSV plus assay is intended as an aid in the diagnosis of influenza from Nasopharyngeal swab specimens and should not be used as a sole basis for treatment. Nasal washings and aspirates are unacceptable for Xpert Xpress SARS-CoV-2/FLU/RSV testing.  Fact Sheet for Patients: bloggercourse.com  Fact Sheet for Healthcare Providers: seriousbroker.it  This test is not yet approved or cleared by the [place]  FDA and has been authorized for detection and/or diagnosis of SARS-CoV-2 by FDA under an Emergency Use Authorization (EUA). This EUA will remain in effect (meaning this test can be used) for the duration of the COVID-19 declaration under Section 564(b)(1) of the Act, 21 U.S.C. section 360bbb-3(b)(1), unless the authorization is terminated or revoked.     Resp Syncytial Virus by PCR NEGATIVE NEGATIVE Final    Comment: (NOTE) Fact Sheet for Patients: bloggercourse.com  Fact Sheet for Healthcare Providers: seriousbroker.it  This test is not yet approved or cleared by the [place]  FDA and  has been authorized for detection and/or diagnosis of SARS-CoV-2 by FDA under an Emergency Use Authorization (EUA). This EUA will remain in effect (meaning this test can be used) for the duration of the COVID-19 declaration under Section 564(b)(1) of the Act, 21 U.S.C. section 360bbb-3(b)(1), unless the authorization is terminated or revoked.  Performed at [facility], [address], [place], [place] [address]     FURTHER DISCHARGE INSTRUCTIONS:  Get Medicines reviewed and adjusted: Please take all your medications with you for your next visit with your Primary MD  Laboratory/radiological data: Please request your Primary MD to go over all hospital tests and procedure/radiological results at the follow up, please ask your Primary MD to get all Hospital records  sent to his/her office.  In some cases, they will be blood work, cultures and biopsy results pending at the time of your discharge. Please request that your primary care M.D. goes through all the records of your hospital data and follows up on these results.  Also Note the following: If you experience worsening of your admission symptoms, develop shortness of breath, life threatening emergency, suicidal or homicidal thoughts you must seek medical attention immediately by calling 911 or calling your MD immediately  if symptoms less severe.  You must read complete instructions/literature along with all the possible adverse reactions/side effects for all the Medicines you take and that have been prescribed to you. Take any new Medicines after you have completely understood and accpet all the possible adverse reactions/side effects.   Do not drive when taking Pain medications or sleeping medications (Benzodaizepines)  Do not take more than prescribed Pain, Sleep and Anxiety Medications. It is not advisable to combine anxiety,sleep and pain medications without talking with your primary care practitioner  Special Instructions: If you have smoked or chewed Tobacco  in the last 2 yrs please stop smoking, stop any regular Alcohol   and or any Recreational drug use.  Wear Seat belts while driving.  Please note: You were cared for by a hospitalist during your hospital stay. Once you are discharged, your primary care physician will handle any further medical issues. Please note that NO REFILLS for any discharge medications will be authorized once you are discharged, as it is imperative that you return to your primary care physician (or establish a relationship with a primary care physician if you do not have one) for your post hospital discharge needs so that they can reassess your need for medications and monitor your lab values.  Total Time spent coordinating discharge including counseling, education and  face to face time equals greater than 30 minutes.  SignedBETHA Donalda Applebaum 03/25/2023 9:16 AM

## 2023-03-25 NOTE — Plan of Care (Signed)

## 2023-03-25 NOTE — Plan of Care (Signed)
  Problem: Education: Goal: Knowledge of General Education information will improve Description Including pain rating scale, medication(s)/side effects and non-pharmacologic comfort measures Outcome: Progressing   Problem: Clinical Measurements: Goal: Ability to maintain clinical measurements within normal limits will improve Outcome: Progressing Goal: Will remain free from infection Outcome: Progressing Goal: Diagnostic test results will improve Outcome: Progressing Goal: Respiratory complications will improve Outcome: Progressing Goal: Cardiovascular complication will be avoided Outcome: Progressing   Problem: Activity: Goal: Risk for activity intolerance will decrease Outcome: Progressing   Problem: Coping: Goal: Level of anxiety will decrease Outcome: Progressing   Problem: Safety: Goal: Ability to remain free from injury will improve Outcome: Progressing   Problem: Skin Integrity: Goal: Risk for impaired skin integrity will decrease Outcome: Progressing   

## 2023-04-04 ENCOUNTER — Other Ambulatory Visit: Payer: Self-pay | Admitting: Cardiology

## 2023-04-05 ENCOUNTER — Telehealth (HOSPITAL_COMMUNITY): Payer: Self-pay | Admitting: Cardiology

## 2023-04-05 NOTE — Telephone Encounter (Signed)
Pts wife called to question if 2/21 echo is still needed, echo done 2/5 while in the hospital.   Advised some images were poor views even with definity, provider may want to repeat in hope of getting better images.  Will confirm with provider    03/23/23 echo IMPRESSIONS     1. Extremely poor acoustic windows, even with Definity use. Overall LVEF  appears normal to mildly decreased Cannot fully evaluate regional wall  motion.   2. Right ventricular systolic function is normal. The right ventricular  size is mildly enlarged.   3. The mitral valve was not well visualized. No evidence of mitral valve  regurgitation. No evidence of mitral stenosis.   4. The aortic valve was not well visualized. Aortic valve regurgitation  is not visualized. No aortic stenosis is present.
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
## 2023-04-08 ENCOUNTER — Ambulatory Visit (HOSPITAL_COMMUNITY): Payer: PPO

## 2023-04-08 ENCOUNTER — Ambulatory Visit (HOSPITAL_COMMUNITY)
Admission: RE | Admit: 2023-04-08 | Discharge: 2023-04-08 | Disposition: A | Discharge status: Home / Self Care | Payer: PPO | Source: Ambulatory Visit | Attending: Cardiology | Admitting: Cardiology

## 2023-04-08 VITALS — BP 110/60 | HR 93 | Wt 243.4 lb

## 2023-04-08 DIAGNOSIS — I5022 Chronic systolic (congestive) heart failure: Secondary | ICD-10-CM | POA: Diagnosis present

## 2023-04-08 DIAGNOSIS — R0602 Shortness of breath: Secondary | ICD-10-CM

## 2023-04-08 DIAGNOSIS — I110 Hypertensive heart disease with heart failure: Secondary | ICD-10-CM | POA: Diagnosis present

## 2023-04-08 DIAGNOSIS — Z7902 Long term (current) use of antithrombotics/antiplatelets: Secondary | ICD-10-CM | POA: Insufficient documentation

## 2023-04-08 DIAGNOSIS — I255 Ischemic cardiomyopathy: Secondary | ICD-10-CM | POA: Diagnosis not present

## 2023-04-08 DIAGNOSIS — Z7901 Long term (current) use of anticoagulants: Secondary | ICD-10-CM | POA: Insufficient documentation

## 2023-04-08 DIAGNOSIS — Z9889 Other specified postprocedural states: Secondary | ICD-10-CM | POA: Diagnosis not present

## 2023-04-08 DIAGNOSIS — I2510 Atherosclerotic heart disease of native coronary artery without angina pectoris: Secondary | ICD-10-CM | POA: Insufficient documentation

## 2023-04-08 DIAGNOSIS — J449 Chronic obstructive pulmonary disease, unspecified: Secondary | ICD-10-CM | POA: Insufficient documentation

## 2023-04-08 DIAGNOSIS — I5020 Unspecified systolic (congestive) heart failure: Secondary | ICD-10-CM | POA: Diagnosis not present

## 2023-04-08 DIAGNOSIS — G4733 Obstructive sleep apnea (adult) (pediatric): Secondary | ICD-10-CM | POA: Diagnosis not present

## 2023-04-08 DIAGNOSIS — E785 Hyperlipidemia, unspecified: Secondary | ICD-10-CM | POA: Insufficient documentation

## 2023-04-08 DIAGNOSIS — I4892 Unspecified atrial flutter: Secondary | ICD-10-CM | POA: Diagnosis not present

## 2023-04-08 DIAGNOSIS — I5032 Chronic diastolic (congestive) heart failure: Secondary | ICD-10-CM | POA: Diagnosis not present

## 2023-04-08 DIAGNOSIS — R0609 Other forms of dyspnea: Secondary | ICD-10-CM | POA: Diagnosis not present

## 2023-04-08 DIAGNOSIS — I4891 Unspecified atrial fibrillation: Secondary | ICD-10-CM | POA: Diagnosis not present

## 2023-04-08 DIAGNOSIS — Z950 Presence of cardiac pacemaker: Secondary | ICD-10-CM | POA: Diagnosis not present

## 2023-04-08 LAB — BASIC METABOLIC PANEL
Anion gap: 10 (ref 5–15)
BUN: 19 mg/dL (ref 8–23)
CO2: 21 mmol/L — ABNORMAL LOW (ref 22–32)
Calcium: 9 mg/dL (ref 8.9–10.3)
Chloride: 110 mmol/L (ref 98–111)
Creatinine, Ser: 1.34 mg/dL — ABNORMAL HIGH (ref 0.61–1.24)
GFR, Estimated: 54 mL/min — ABNORMAL LOW (ref >60)
Glucose, Bld: 101 mg/dL — ABNORMAL HIGH (ref 70–99)
Potassium: 3.7 mmol/L (ref 3.5–5.1)
Sodium: 141 mmol/L (ref 135–145)

## 2023-04-08 LAB — BRAIN NATRIURETIC PEPTIDE: B Natriuretic Peptide: 138.4 pg/mL — ABNORMAL HIGH (ref 0.0–100.0)

## 2023-04-08 NOTE — Patient Instructions (Addendum)
Medication Changes:  PLEASE TAKE TORSEMIDE 20MG  FOR THE NEXT 2 DAYS   Lab Work:  Labs done today, your results will be available in MyChart, we will contact you for abnormal readings.  Testing/Procedures:  We will call you to arrange right heart cath   SOMEONE WILL CALL YOU TO GET [facility] FOR THIS ONCE APPROVED  [facility] [address] [place], [place] [address] Please take advantage of the free valet parking available at the [facility] and [employer] (Entrance C).  Proceed to the [facility] Radiology Department (First Floor) for check-in.  Magnetic resonance imaging (MRI) is a painless test that produces images of the inside of the body without using Xrays.  During an MRI, strong magnets and radio waves work together in a Data processing manager to form detailed images.   MRI images may provide more details about a medical condition than X-rays, CT scans, and ultrasounds can provide.  You may be given earphones to listen for instructions.  You may eat a light breakfast and take medications as ordered with the exception of furosemide, hydrochlorothiazide, chlorthalidone or spironolactone (or any other fluid pill). If you are undergoing a stress MRI, please avoid stimulants for 12 hr prior to test. (I.e. Caffeine, nicotine, chocolate, or antihistamine medications)  An IV will be inserted into one of your veins. Contrast material will be injected into your IV. It will leave your body through your urine within a day. You may be told to drink plenty of fluids to help flush the contrast material out of your system.  You will be asked to remove all metal, including: Watch, jewelry, and other metal objects including hearing aids, hair pieces and dentures. Also wearable glucose monitoring systems (ie. Freestyle Libre and Omnipods) (Braces and fillings normally are not a problem.)   TEST WILL TAKE APPROXIMATELY 1 HOUR  PLEASE NOTIFY SCHEDULING AT LEAST 24 HOURS IN ADVANCE IF  YOU ARE UNABLE TO KEEP YOUR APPOINTMENT. [phone]  For more information and frequently asked questions, please visit our website : [email or web address]  Please call the Cardiac Imaging Nurse Navigators with any questions/concerns. [phone] Office   Your physician has recommended that you have a pulmonary function test. Pulmonary Function Tests are a group of tests that measure how well air moves in and out of your lungs.  Follow-Up in: 3 MONTHS .PLEASE CALL OUR OFFICE AROUND mid march TO GET SCHEDULED FOR YOUR APPOINTMENT. PHONE NUMBER IS [phone] OPTION 2   At the [facility], you and your health needs are our priority. We have a designated team specialized in the treatment of Heart Failure. This Care Team includes your primary Heart Failure Specialized Cardiologist (physician), Advanced Practice Providers (APPs- Physician Assistants and Nurse Practitioners), and Pharmacist who all work together to provide you with the care you need, when you need it.   You may see any of the following providers on your designated Care Team at your next follow up:  Dr. Arvilla Meres Dr. Marca Ancona Dr. Dorthula Nettles Dr. Theresia Bough Tonye Becket, NP Robbie Lis, [place] [facility] [place], [place] Brynda Peon, NP [place] Lee, NP Karle Plumber, PharmD   Please be sure to bring in all your medications bottles to every appointment.   Need to Contact us:  If you have any questions or concerns before your next appointment please send [place] a message through [place] or call our office at [phone].    TO LEAVE A MESSAGE FOR THE NURSE SELECT OPTION 2, PLEASE  LEAVE A MESSAGE INCLUDING: YOUR NAME DATE OF BIRTH CALL BACK NUMBER REASON FOR CALL**this is important as we prioritize the call backs  YOU WILL RECEIVE A CALL BACK THE SAME DAY AS LONG AS YOU CALL BEFORE 4:00 PM

## 2023-04-08 NOTE — Telephone Encounter (Signed)
 pT aware

## 2023-04-08 NOTE — Progress Notes (Signed)
6 Min Walk Test Completed  Pt ambulated 800 ft (243.84 m) O2 Sat ranged 98-100 on None L oxygen HR ranged 108-124

## 2023-04-08 NOTE — Progress Notes (Signed)
ReDS Vest / Clip - 04/08/23 1500       ReDS Vest / Clip   Station Marker D    Ruler Value 34.5    ReDS Value Range Low volume    ReDS Actual Value 33

## 2023-04-12 ENCOUNTER — Encounter: Payer: Self-pay | Admitting: *Deleted

## 2023-04-13 NOTE — Progress Notes (Signed)
 Remote pacemaker transmission.

## 2023-04-14 ENCOUNTER — Encounter: Payer: Self-pay | Admitting: Cardiology

## 2023-04-14 ENCOUNTER — Ambulatory Visit: Payer: PPO | Attending: Cardiology | Admitting: Cardiology

## 2023-04-14 VITALS — BP 132/64 | HR 98 | Ht 66.0 in | Wt 243.2 lb

## 2023-04-14 DIAGNOSIS — I442 Atrioventricular block, complete: Secondary | ICD-10-CM | POA: Diagnosis not present

## 2023-04-14 DIAGNOSIS — I2510 Atherosclerotic heart disease of native coronary artery without angina pectoris: Secondary | ICD-10-CM | POA: Diagnosis not present

## 2023-04-14 DIAGNOSIS — D6869 Other thrombophilia: Secondary | ICD-10-CM | POA: Diagnosis not present

## 2023-04-14 DIAGNOSIS — I5023 Acute on chronic systolic (congestive) heart failure: Secondary | ICD-10-CM

## 2023-04-14 DIAGNOSIS — I4891 Unspecified atrial fibrillation: Secondary | ICD-10-CM

## 2023-04-14 DIAGNOSIS — Z79899 Other long term (current) drug therapy: Secondary | ICD-10-CM

## 2023-04-14 MED ORDER — ROSUVASTATIN CALCIUM 5 MG PO TABS: 5.0000 mg | ORAL_TABLET | Freq: Every day | ORAL | 6 refills | Status: DC

## 2023-04-14 MED ORDER — TORSEMIDE 20 MG PO TABS: 20.0000 mg | ORAL_TABLET | ORAL | 1 refills | Status: DC

## 2023-04-14 MED ORDER — SACUBITRIL-VALSARTAN 24-26 MG PO TABS: 1.0000 | ORAL_TABLET | Freq: Two times a day (BID) | ORAL | Status: DC

## 2023-04-14 NOTE — Patient Instructions (Addendum)
 Medication Instructions:   Resuming Entresto 24/26mg  twice a day  Begin Crestor 5mg  daily  Change Torsemide to 20mg  every other day  Continue all other medications.     Labwork:  BMET, MG. - orders given  Do in 2 weeks   Testing/Procedures:  none  Follow-Up:  3 months   Any Other Special Instructions Will Be Listed Below (If Applicable).   If you need a refill on your cardiac medications before your next appointment, please call your pharmacy.
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
## 2023-05-10 ENCOUNTER — Other Ambulatory Visit: Payer: Self-pay | Admitting: Cardiology

## 2023-05-11 ENCOUNTER — Encounter (HOSPITAL_COMMUNITY)

## 2023-05-20 ENCOUNTER — Ambulatory Visit (HOSPITAL_COMMUNITY)
Admission: RE | Admit: 2023-05-20 | Discharge: 2023-05-20 | Disposition: A | Discharge status: Home / Self Care | Source: Ambulatory Visit | Attending: Cardiology | Admitting: Cardiology

## 2023-05-20 DIAGNOSIS — I5020 Unspecified systolic (congestive) heart failure: Secondary | ICD-10-CM | POA: Diagnosis present

## 2023-05-20 LAB — PULMONARY FUNCTION TEST
DL/VA % pred: 69 %
DL/VA: 2.78 ml/min/mmHg/L
DLCO unc % pred: 64 %
DLCO unc: 14.12 ml/min/mmHg
FEF 25-75 Post: 2.34 L/s
FEF 25-75 Pre: 1.56 L/s
FEF2575-%Change-Post: 49 %
FEF2575-%Pred-Post: 136 %
FEF2575-%Pred-Pre: 91 %
FEV1-%Change-Post: 7 %
FEV1-%Pred-Post: 101 %
FEV1-%Pred-Pre: 94 %
FEV1-Post: 2.48 L
FEV1-Pre: 2.32 L
FEV1FVC-%Change-Post: -2 %
FEV1FVC-%Pred-Pre: 101 %
FEV6-%Change-Post: 6 %
FEV6-%Pred-Post: 105 %
FEV6-%Pred-Pre: 98 %
FEV6-Post: 3.37 L
FEV6-Pre: 3.16 L
FEV6FVC-%Change-Post: -2 %
FEV6FVC-%Pred-Post: 104 %
FEV6FVC-%Pred-Pre: 107 %
FVC-%Change-Post: 9 %
FVC-%Pred-Post: 100 %
FVC-%Pred-Pre: 91 %
FVC-Post: 3.46 L
FVC-Pre: 3.16 L
Post FEV1/FVC ratio: 72 %
Post FEV6/FVC ratio: 97 %
Pre FEV1/FVC ratio: 73 %
Pre FEV6/FVC Ratio: 100 %
RV % pred: 88 %
RV: 2.12 L
TLC % pred: 87 %
TLC: 5.46 L

## 2023-05-20 MED ORDER — ALBUTEROL SULFATE (2.5 MG/3ML) 0.083% IN NEBU
2.5000 mg | INHALATION_SOLUTION | Freq: Once | RESPIRATORY_TRACT | Status: AC
  Administered 2023-05-20: 2.5 mg via RESPIRATORY_TRACT

## 2023-05-28 ENCOUNTER — Other Ambulatory Visit: Payer: Self-pay | Admitting: Cardiology

## 2023-05-31 ENCOUNTER — Encounter (HOSPITAL_COMMUNITY): Payer: Self-pay

## 2023-06-02 ENCOUNTER — Ambulatory Visit (HOSPITAL_COMMUNITY)
Admission: RE | Admit: 2023-06-02 | Discharge: 2023-06-02 | Disposition: A | Discharge status: Home / Self Care | Source: Ambulatory Visit | Attending: Cardiology | Admitting: Cardiology

## 2023-06-02 ENCOUNTER — Other Ambulatory Visit (HOSPITAL_COMMUNITY): Payer: Self-pay | Admitting: Cardiology

## 2023-06-02 DIAGNOSIS — I5020 Unspecified systolic (congestive) heart failure: Secondary | ICD-10-CM | POA: Diagnosis present

## 2023-06-02 MED ORDER — GADOBUTROL 1 MMOL/ML IV SOLN
10.0000 mL | Freq: Once | INTRAVENOUS | Status: AC | PRN
  Administered 2023-06-02: 10 mL via INTRAVENOUS

## 2023-06-06 ENCOUNTER — Ambulatory Visit: Payer: PPO

## 2023-06-06 DIAGNOSIS — I442 Atrioventricular block, complete: Secondary | ICD-10-CM | POA: Diagnosis not present

## 2023-06-07 ENCOUNTER — Other Ambulatory Visit: Payer: Self-pay | Admitting: Cardiology

## 2023-06-07 DIAGNOSIS — I480 Paroxysmal atrial fibrillation: Secondary | ICD-10-CM

## 2023-06-07 LAB — CUP PACEART REMOTE DEVICE CHECK
Battery Remaining Longevity: 115 mo
Battery Voltage: 3.01 V
Brady Statistic AP VP Percent: 1.49 %
Brady Statistic AP VS Percent: 0.01 %
Brady Statistic AS VP Percent: 98.1 %
Brady Statistic AS VS Percent: 0.4 %
Brady Statistic RA Percent Paced: 1.51 %
Brady Statistic RV Percent Paced: 99.59 %
Date Time Interrogation Session: 20250420232151
Implantable Lead Connection Status: 753985
Implantable Lead Connection Status: 753985
Implantable Lead Implant Date: 20221014
Implantable Lead Implant Date: 20221014
Implantable Lead Location: 753859
Implantable Lead Location: 753860
Implantable Lead Model: 3830
Implantable Lead Model: 5076
Implantable Pulse Generator Implant Date: 20221014
Lead Channel Impedance Value: 304 Ohm
Lead Channel Impedance Value: 342 Ohm
Lead Channel Impedance Value: 399 Ohm
Lead Channel Impedance Value: 399 Ohm
Lead Channel Pacing Threshold Amplitude: 0.5 V
Lead Channel Pacing Threshold Amplitude: 0.875 V
Lead Channel Pacing Threshold Pulse Width: 0.4 ms
Lead Channel Pacing Threshold Pulse Width: 0.4 ms
Lead Channel Sensing Intrinsic Amplitude: 14.625 mV
Lead Channel Sensing Intrinsic Amplitude: 14.625 mV
Lead Channel Sensing Intrinsic Amplitude: 5.625 mV
Lead Channel Sensing Intrinsic Amplitude: 5.625 mV
Lead Channel Setting Pacing Amplitude: 1.75 V
Lead Channel Setting Pacing Amplitude: 2 V
Lead Channel Setting Pacing Pulse Width: 0.4 ms
Lead Channel Setting Sensing Sensitivity: 0.9 mV
Zone Setting Status: 755011
Zone Setting Status: 755011

## 2023-06-10 ENCOUNTER — Telehealth: Payer: Self-pay | Admitting: Cardiology

## 2023-06-10 ENCOUNTER — Telehealth (HOSPITAL_COMMUNITY): Payer: Self-pay | Admitting: Cardiology

## 2023-06-10 NOTE — Telephone Encounter (Signed)
 Advised that per 04/14/2023 office note by Dr. Amanda Jungling: recent admission significant AKI, entresto  and aldactone  stopped and can continue to hold off on aldactone  for now.  Advised that he should not be taking spironolactone . Verbalized understanding.

## 2023-06-10 NOTE — Telephone Encounter (Signed)
 Patient left VM on triage line request cardiac mri results from provider   No follow up scheduled

## 2023-06-10 NOTE — Telephone Encounter (Signed)
 Pt c/o medication issue:  1. Name of Medication:   spironolactone  (ALDACTONE ) 25 MG tablet   2. How are you currently taking this medication (dosage and times per day)?   3. Are you having a reaction (difficulty breathing--STAT)?   4. What is your medication issue?   Patient stated he ran out of this medication about 2 weeks ago and wants to know if he should still be taking this medication as he will need to get a refill.

## 2023-06-10 NOTE — Telephone Encounter (Signed)
 Pt aware and voiced understanding

## 2023-07-06 ENCOUNTER — Other Ambulatory Visit: Payer: Self-pay | Admitting: Cardiology

## 2023-07-15 ENCOUNTER — Encounter: Payer: Self-pay | Admitting: Cardiology

## 2023-07-15 ENCOUNTER — Ambulatory Visit: Payer: PPO | Attending: Cardiology | Admitting: Cardiology

## 2023-07-15 VITALS — BP 118/62 | HR 106 | Ht 66.0 in | Wt 249.2 lb

## 2023-07-15 DIAGNOSIS — Z79899 Other long term (current) drug therapy: Secondary | ICD-10-CM

## 2023-07-15 DIAGNOSIS — I5023 Acute on chronic systolic (congestive) heart failure: Secondary | ICD-10-CM | POA: Diagnosis not present

## 2023-07-15 DIAGNOSIS — I2510 Atherosclerotic heart disease of native coronary artery without angina pectoris: Secondary | ICD-10-CM

## 2023-07-15 MED ORDER — TORSEMIDE 20 MG PO TABS: 40.0000 mg | ORAL_TABLET | Freq: Every day | ORAL | 6 refills | Status: DC

## 2023-07-15 NOTE — Patient Instructions (Addendum)
 Medication Instructions:   Increase Torsemide  to 40mg  daily  Continue all other medications.     Labwork:  BMET, Mg - orders given today Please do in 2 weeks   Testing/Procedures:  none  Follow-Up:  4 weeks    Any Other Special Instructions Will Be Listed Below (If Applicable).  Schedule follow up with heart failure clinic Please call the office in 1 week with weights & update on swelling   If you need a refill on your cardiac medications before your next appointment, please call your pharmacy.
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
## 2023-07-19 ENCOUNTER — Other Ambulatory Visit: Payer: Self-pay | Admitting: Cardiology

## 2023-07-22 NOTE — Addendum Note (Signed)
 Addended by: Edra Govern D on: 07/22/2023 03:23 PM   Modules accepted: Orders

## 2023-07-22 NOTE — Progress Notes (Signed)
 Remote pacemaker transmission.

## 2023-07-28 ENCOUNTER — Encounter (HOSPITAL_COMMUNITY): Payer: Self-pay | Admitting: Cardiology

## 2023-07-28 ENCOUNTER — Ambulatory Visit (HOSPITAL_COMMUNITY)
Admission: RE | Admit: 2023-07-28 | Discharge: 2023-07-28 | Disposition: A | Discharge status: Home / Self Care | Source: Ambulatory Visit | Attending: Cardiology | Admitting: Cardiology

## 2023-07-28 VITALS — BP 116/62 | HR 99 | Ht 66.0 in | Wt 241.8 lb

## 2023-07-28 DIAGNOSIS — I5022 Chronic systolic (congestive) heart failure: Secondary | ICD-10-CM | POA: Diagnosis not present

## 2023-07-28 DIAGNOSIS — R0609 Other forms of dyspnea: Secondary | ICD-10-CM | POA: Insufficient documentation

## 2023-07-28 DIAGNOSIS — Z955 Presence of coronary angioplasty implant and graft: Secondary | ICD-10-CM | POA: Insufficient documentation

## 2023-07-28 DIAGNOSIS — I4892 Unspecified atrial flutter: Secondary | ICD-10-CM | POA: Insufficient documentation

## 2023-07-28 DIAGNOSIS — Z8679 Personal history of other diseases of the circulatory system: Secondary | ICD-10-CM | POA: Insufficient documentation

## 2023-07-28 DIAGNOSIS — I255 Ischemic cardiomyopathy: Secondary | ICD-10-CM | POA: Insufficient documentation

## 2023-07-28 DIAGNOSIS — G4733 Obstructive sleep apnea (adult) (pediatric): Secondary | ICD-10-CM | POA: Diagnosis not present

## 2023-07-28 DIAGNOSIS — M7989 Other specified soft tissue disorders: Secondary | ICD-10-CM | POA: Diagnosis not present

## 2023-07-28 DIAGNOSIS — I252 Old myocardial infarction: Secondary | ICD-10-CM | POA: Diagnosis not present

## 2023-07-28 DIAGNOSIS — J449 Chronic obstructive pulmonary disease, unspecified: Secondary | ICD-10-CM | POA: Diagnosis not present

## 2023-07-28 DIAGNOSIS — E785 Hyperlipidemia, unspecified: Secondary | ICD-10-CM | POA: Insufficient documentation

## 2023-07-28 DIAGNOSIS — Z7984 Long term (current) use of oral hypoglycemic drugs: Secondary | ICD-10-CM | POA: Insufficient documentation

## 2023-07-28 DIAGNOSIS — Z7901 Long term (current) use of anticoagulants: Secondary | ICD-10-CM | POA: Diagnosis not present

## 2023-07-28 DIAGNOSIS — Z6841 Body Mass Index (BMI) 40.0 and over, adult: Secondary | ICD-10-CM | POA: Diagnosis not present

## 2023-07-28 DIAGNOSIS — I2510 Atherosclerotic heart disease of native coronary artery without angina pectoris: Secondary | ICD-10-CM | POA: Insufficient documentation

## 2023-07-28 DIAGNOSIS — I714 Abdominal aortic aneurysm, without rupture, unspecified: Secondary | ICD-10-CM | POA: Diagnosis present

## 2023-07-28 DIAGNOSIS — Z950 Presence of cardiac pacemaker: Secondary | ICD-10-CM | POA: Insufficient documentation

## 2023-07-28 DIAGNOSIS — I442 Atrioventricular block, complete: Secondary | ICD-10-CM

## 2023-07-28 DIAGNOSIS — E669 Obesity, unspecified: Secondary | ICD-10-CM | POA: Insufficient documentation

## 2023-07-28 DIAGNOSIS — I4891 Unspecified atrial fibrillation: Secondary | ICD-10-CM | POA: Diagnosis not present

## 2023-07-28 DIAGNOSIS — I110 Hypertensive heart disease with heart failure: Secondary | ICD-10-CM | POA: Insufficient documentation

## 2023-07-28 DIAGNOSIS — I480 Paroxysmal atrial fibrillation: Secondary | ICD-10-CM | POA: Diagnosis not present
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
## 2023-07-28 NOTE — Patient Instructions (Signed)
 Medication Changes:  DECREASE TORSEMIDE  TO 20MG  FOR 2 DAYS   Testing/Procedures:  LEFT LOWER EXTREMITY VENOUS ULTRASOUND- SCHEDULING WILL CALL YOU TO ARRANGE THIS   ECHOCARDIOGRAM AS SCHEDULED IN 3 MONTHS   Referrals:  YOU HAVE BEEN REFERRED TO [facility] THEY WILL REACH OUT TO YOU OR CALL TO ARRANGE THIS. PLEASE CALL US  WITH ANY CONCERNS   Follow-Up in: 3 MONTHS AS SCHEDULED WITH DR. Bruce Caper   At the [facility], you and your health needs are our priority. We have a designated team specialized in the treatment of Heart Failure. This Care Team includes your primary Heart Failure Specialized Cardiologist (physician), Advanced Practice Providers (APPs- Physician Assistants and Nurse Practitioners), and Pharmacist who all work together to provide you with the care you need, when you need it.   You may see any of the following providers on your designated Care Team at your next follow up:  Dr. Jules Oar Dr. Peder Bourdon Dr. Alwin Baars Dr. Judyth Nunnery Nieves Bars, NP Ruddy Corral, [place] [facility] Dana, [place] Dennise Fitz, NP [place] Lee, NP Luster Salters, PharmD   Please be sure to bring in all your medications bottles to every appointment.   Need to Contact Us :  If you have any questions or concerns before your next appointment please send us  a message through [place] or call our office at [phone].    TO LEAVE A MESSAGE FOR THE NURSE SELECT OPTION 2, PLEASE LEAVE A MESSAGE INCLUDING: YOUR NAME DATE OF BIRTH CALL BACK NUMBER REASON FOR CALL**this is important as we prioritize the call backs  YOU WILL RECEIVE A CALL BACK THE SAME DAY AS LONG AS YOU CALL BEFORE 4:00 PM

## 2023-08-04 ENCOUNTER — Ambulatory Visit (HOSPITAL_COMMUNITY)
Admission: RE | Admit: 2023-08-04 | Discharge: 2023-08-04 | Disposition: A | Discharge status: Home / Self Care | Source: Ambulatory Visit | Attending: Cardiology | Admitting: Cardiology

## 2023-08-04 DIAGNOSIS — M7989 Other specified soft tissue disorders: Secondary | ICD-10-CM

## 2023-08-09 ENCOUNTER — Ambulatory Visit: Attending: Cardiology | Admitting: Cardiology

## 2023-08-09 ENCOUNTER — Other Ambulatory Visit: Payer: Self-pay | Admitting: Cardiology

## 2023-08-09 ENCOUNTER — Encounter: Payer: Self-pay | Admitting: Cardiology

## 2023-08-09 VITALS — BP 110/60 | HR 90 | Ht 66.0 in | Wt 240.0 lb

## 2023-08-09 DIAGNOSIS — I442 Atrioventricular block, complete: Secondary | ICD-10-CM

## 2023-08-09 DIAGNOSIS — I5023 Acute on chronic systolic (congestive) heart failure: Secondary | ICD-10-CM | POA: Diagnosis not present

## 2023-08-09 DIAGNOSIS — I480 Paroxysmal atrial fibrillation: Secondary | ICD-10-CM | POA: Diagnosis not present

## 2023-08-09 DIAGNOSIS — D6869 Other thrombophilia: Secondary | ICD-10-CM

## 2023-08-09 DIAGNOSIS — I2510 Atherosclerotic heart disease of native coronary artery without angina pectoris: Secondary | ICD-10-CM

## 2023-08-09 MED ORDER — TORSEMIDE 20 MG PO TABS
ORAL_TABLET | ORAL | 0 refills | Status: DC
Start: 2023-08-09 — End: 2023-09-21

## 2023-08-09 NOTE — Patient Instructions (Signed)
 Medication Instructions:  Your physician has recommended you make the following change in your medication:  INCREASE Torsemide  to 80 mg daily for the next 4 days, then return to normal dosing   *If you need a refill on your cardiac medications before your next appointment, please call your pharmacy*  Lab Work: None ordered  Testing/Procedures: None ordered  Follow-Up: At [facility], you and your health needs are our priority.  As part of our continuing mission to provide you with exceptional heart care, our providers are all part of one team.  This team includes your primary Cardiologist (physician) and Advanced Practice Providers or APPs (Physician Assistants and Nurse Practitioners) who all work together to provide you with the care you need, when you need it.  Remote monitoring is used to monitor your Pacemaker or ICD from home. This monitoring reduces the number of office visits required to check your device to one time per year. It allows us  to keep an eye on the functioning of your device to ensure it is working properly. You are scheduled for a device check from home on 09/05/2023. You may send your transmission at any time that day. If you have a wireless device, the transmission will be sent automatically. After your physician reviews your transmission, you will receive a postcard with your next transmission date.   Your next appointment:   1 year(s)  Provider:   You will see one of the following Advanced Practice Providers on your designated Care Team:   Charlies Arthur, PA-C Michael Andy Tillery, PA-C Suzann Riddle, NP Daphne Barrack, NP    Thank you for choosing [facility]!!   Maeola Domino, RN [phone]

## 2023-08-09 NOTE — Progress Notes (Signed)
  Electrophysiology Office Note:   Date:  08/09/2023  ID:  Jonathan Garner., DOB May 22, 1945, MRN [id number]  Primary Cardiologist: Alvan Carrier, MD Primary Heart Failure: None Electrophysiologist: Lauryl Seyer Gladis Norton, MD      History of Present Illness:   Jonathan Garner. is a 78 y.o. male with h/o obstructive sleep apnea, abdominal aortic aneurysm postrepair, coronary artery disease, hypertension, hyperlipidemia, chronic systolic heart failure due to ischemic cardiomyopathy, complete heart block seen today for routine electrophysiology followup.   Since last being seen in our clinic the patient reports fatigue and lower extremity edema as well as dizziness and lightheadedness.  He gets dizziness with room spinning when he is sitting down.  He also has room spinning when he is standing, but does have orthostatic type symptoms when he stands quickly.  He is also been having significant lower extremity edema.  He has discomfort in his legs.  He says that the edema has not improved in the morning.  He has tried to wear compression stockings without improvement.  he denies chest pain, palpitations, PND, orthopnea, nausea, vomiting, syncope, weight gain, or early satiety.   Review of systems complete and found to be negative unless listed in HPI.      EP Information / Studies Reviewed:    EKG is not ordered today. EKG from 6-25 reviewed which showed atrial sensed, ventricular paced      PPM Interrogation-  reviewed in detail today,  See PACEART report.  Device History: Medtronic Dual Chamber PPM implanted 2022 for CHB  Risk Assessment/Calculations:            Physical Exam:   VS:  There were no vitals taken for this visit.   Wt Readings from Last 3 Encounters:  07/28/23 241 lb 12.8 oz (109.7 kg)  07/15/23 249 lb 3.2 oz (113 kg)  04/14/23 243 lb 3.2 oz (110.3 kg)     GEN: Well nourished, well developed in no acute distress NECK: No JVD; No carotid bruits CARDIAC:  Regular rate and rhythm, no murmurs, rubs, gallops RESPIRATORY:  Clear to auscultation without rales, wheezing or rhonchi  ABDOMEN: Soft, non-tender, non-distended EXTREMITIES:  No edema; No deformity   ASSESSMENT AND PLAN:    CHB s/p Medtronic PPM  Normal PPM function See Pace Art report Sensing, threshold, impedance within normal limits Programming appropriate No changes today  2.  Acute on chronic systolic heart failure: Has significant lower extremity edema and dizziness.  He does have somewhat static, but there may be a vertigo component as well.  I am concerned with his edema as he is having quite a bit of discomfort.  Syeda Prickett increase torsemide  to 80 mg daily for the next 4 days.  I have encouraged him to contact his heart failure cardiologist.  3.  Paroxysmal atrial fibrillation: Currently on Eliquis .  Remains in sinus rhythm.  4.  Secondary hypercoagulable state: On Eliquis .  CHA2DS2-VASc of 5.  5.  Coronary artery disease: Plan per primary cardiology  Disposition:   Follow up with EP APP in 12 months  Signed, Maritssa Haughton Gladis Norton, MD

## 2023-08-22 ENCOUNTER — Other Ambulatory Visit: Payer: Self-pay | Admitting: Cardiology

## 2023-08-25 ENCOUNTER — Encounter: Payer: Self-pay | Admitting: *Deleted

## 2023-08-26 ENCOUNTER — Telehealth: Payer: Self-pay | Admitting: Cardiology

## 2023-08-26 ENCOUNTER — Ambulatory Visit: Admitting: Cardiology

## 2023-08-26 NOTE — Telephone Encounter (Signed)
 RX sent in on 08/25/23

## 2023-08-26 NOTE — Telephone Encounter (Signed)
 Called pt NO response. Per DPR okay to leave VM on cellphone. LM for pt and  informed him the Prescription for torsemide  was sent in on 08/25/2023.

## 2023-08-26 NOTE — Telephone Encounter (Signed)
 1. Which medications need to be refilled? (please list name of each medication and dose if known) torsemide    2. Which pharmacy/location (including street and city if local pharmacy) is medication to be sent to? [facility]  3. Do they need a 30 day or 90 day supply? 90  Needs filled asap
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
## 2023-08-30 ENCOUNTER — Telehealth: Payer: Self-pay | Admitting: Cardiology

## 2023-08-30 ENCOUNTER — Encounter: Payer: Self-pay | Admitting: Internal Medicine

## 2023-08-30 DIAGNOSIS — Z0279 Encounter for issue of other medical certificate: Secondary | ICD-10-CM

## 2023-08-30 NOTE — Telephone Encounter (Signed)
 Pt dropped off [employer] DMV form for Disability Window tag renewal.  Paid $29 cash Would like a call at [phone] to pick up when complete Faxed to  for Dr.Branch

## 2023-08-31 NOTE — Telephone Encounter (Signed)
 Alan faxed back signed paper. Called pt for pick up

## 2023-09-05 ENCOUNTER — Other Ambulatory Visit: Payer: Self-pay | Admitting: Cardiology

## 2023-09-05 ENCOUNTER — Ambulatory Visit: Payer: PPO

## 2023-09-05 DIAGNOSIS — I480 Paroxysmal atrial fibrillation: Secondary | ICD-10-CM

## 2023-09-05 DIAGNOSIS — I442 Atrioventricular block, complete: Secondary | ICD-10-CM

## 2023-09-06 LAB — CUP PACEART REMOTE DEVICE CHECK
Battery Remaining Longevity: 114 mo
Battery Voltage: 3.01 V
Brady Statistic AP VP Percent: 4.01 %
Brady Statistic AP VS Percent: 0 %
Brady Statistic AS VP Percent: 95.88 %
Brady Statistic AS VS Percent: 0.11 %
Brady Statistic RA Percent Paced: 3.94 %
Brady Statistic RV Percent Paced: 99.89 %
Date Time Interrogation Session: 20250721034241
Implantable Lead Connection Status: 753985
Implantable Lead Connection Status: 753985
Implantable Lead Implant Date: 20221014
Implantable Lead Implant Date: 20221014
Implantable Lead Location: 753859
Implantable Lead Location: 753860
Implantable Lead Model: 3830
Implantable Lead Model: 5076
Implantable Pulse Generator Implant Date: 20221014
Lead Channel Impedance Value: 304 Ohm
Lead Channel Impedance Value: 323 Ohm
Lead Channel Impedance Value: 418 Ohm
Lead Channel Impedance Value: 437 Ohm
Lead Channel Pacing Threshold Amplitude: 0.625 V
Lead Channel Pacing Threshold Amplitude: 1 V
Lead Channel Pacing Threshold Pulse Width: 0.4 ms
Lead Channel Pacing Threshold Pulse Width: 0.4 ms
Lead Channel Sensing Intrinsic Amplitude: 14.625 mV
Lead Channel Sensing Intrinsic Amplitude: 14.625 mV
Lead Channel Sensing Intrinsic Amplitude: 4.875 mV
Lead Channel Sensing Intrinsic Amplitude: 4.875 mV
Lead Channel Setting Pacing Amplitude: 1.5 V
Lead Channel Setting Pacing Amplitude: 2 V
Lead Channel Setting Pacing Pulse Width: 0.4 ms
Lead Channel Setting Sensing Sensitivity: 1.2 mV
Zone Setting Status: 755011
Zone Setting Status: 755011

## 2023-09-13 ENCOUNTER — Ambulatory Visit: Attending: Internal Medicine | Admitting: Pharmacist

## 2023-09-13 NOTE — Patient Instructions (Signed)

## 2023-09-13 NOTE — Progress Notes (Signed)
 Patient ID: Jonathan Garner.                 DOB: Jan 08, 1946                    MRN: [id number]     HPI: Jonathan Garner. is a 78 y.o. male patient referred to pharmacy clinic by Dr. Gardenia to initiate GLP1-RA therapy. PMH is significant for COPD, thoracic and abdominal aortic aneurysm s/p repair, CAD (NSTEMI in 2022 w/ PCI to the LAD and RCA), HTN, HLD, ICM, CHB s/p PPM and heart failure , and obesity. Most recent BMI 38 kg/m .  Patient presents today to PharmD clinic accompanied by his wife. Reports chronic pain and SOB that limits his activity. Wife wants him to come with her to senior center and do chair exercises.   Diet:  Breakfast: skips Lunch: sandwich- turkey/ham mayo w/ whole wheat, chips Dinner: meat and vegetable or salad, rice (jasmine white rice) and pasta Drink: water, coffee, tea, fruit punch Snack: fruit, chips, popcorn  Exercise: not much due to back pain, SOB  Family History:   Social History: no ETOH, no tobacco  Labs: No results found for: HGBA1C  Wt Readings from Last 1 Encounters:  08/09/23 240 lb (108.9 kg)    BP Readings from Last 1 Encounters:  08/09/23 110/60   Pulse Readings from Last 1 Encounters:  08/09/23 90    No results found for: CHOL, TRIG, HDL, CHOLHDL, VLDL, LDLCALC, LDLDIRECT  Past Medical History:  Diagnosis Date   AAA (abdominal aortic aneurysm) (HCC)    Arthritis    CAD (coronary artery disease)    CHF (congestive heart failure) (HCC)    COPD (chronic obstructive pulmonary disease) (HCC)    Diverticulosis    Dyspnea    W/ EXERTION    Dysrhythmia    Complete Heart block   has PPM   Gout    Gynecomastia    Hx of emphysema (HCC)    Hyperlipidemia    Hypertension    Lumbar spinal stenosis    Myocardial infarction (HCC)    OSA (obstructive sleep apnea)    Pedal edema    Pre-diabetes    Presence of permanent cardiac pacemaker     Current Outpatient Medications on File Prior to Visit   Medication Sig Dispense Refill   clopidogrel  (PLAVIX ) 75 MG tablet Take 1 tablet by mouth once daily 90 tablet 3   Cyanocobalamin  ([employer]S VITAMIN B-12) 5000 MCG SUBL Place 5,000 mcg under the tongue daily.     cyclobenzaprine (FLEXERIL) 10 MG tablet Take 10 mg by mouth 3 (three) times daily as needed for muscle spasms.     ELIQUIS  5 MG TABS tablet Take 1 tablet by mouth twice daily 60 tablet 0   JARDIANCE  10 MG TABS tablet Take 1 tablet by mouth once daily 90 tablet 1   metoprolol  succinate (TOPROL  XL) 25 MG 24 hr tablet Take 1 tablet (25 mg total) by mouth at bedtime. 90 tablet 3   nitroGLYCERIN  (NITROSTAT ) 0.4 MG SL tablet Place 1 tablet (0.4 mg total) under the tongue every 5 (five) minutes x 3 doses as needed for chest pain. 25 tablet 1   rosuvastatin  (CRESTOR ) 5 MG tablet Take 1 tablet (5 mg total) by mouth daily. 30 tablet 6   sacubitril -valsartan  (ENTRESTO ) 24-26 MG Take 1 tablet by mouth twice daily 180 tablet 2   torsemide  (DEMADEX ) 20 MG tablet Take 2 tablets (40 mg total)  by mouth daily. 30 tablet 6   torsemide  (DEMADEX ) 20 MG tablet Take 80 mg daily for the next 4 days, then return to normal dosing. 8 tablet 0   torsemide  (DEMADEX ) 20 MG tablet TAKE 1 TABLET BY MOUTH EVERY OTHER DAY 45 tablet 3   No current facility-administered medications on file prior to visit.    Allergies  Allergen Reactions   Atorvastatin      joint pain      Assessment/Plan:  1. Weight loss - Patient has not met goal of at least 5% of body weight loss with comprehensive lifestyle modifications alone in the past 3-6 months. Pharmacotherapy is appropriate to pursue as augmentation. Will start Wegovy  0.25mg  weekly. Confirmed patient has no personal or family history of medullary thyroid  carcinoma (MTC) or Multiple Endocrine Neoplasia syndrome type 2 (MEN 2). No hx of pancreatitis or gallstones. Discussed risk. Injection technique reviewed at today's visit.  We discussed decreasing calories from drinks,  making sure every meal has protein and fiber.  Advised patient on common side effects including nausea, diarrhea, dyspepsia, decreased appetite, and fatigue. Counseled patient on reducing meal size and how to titrate medication to minimize side effects. Counseled patient to call if intolerable side effects or if experiencing dehydration, abdominal pain, or dizziness. Along with pharmacotherapy, the patient will follow dietary modifications and aim for at least 150 minutes of moderate-intensity exercise per week, plus resistance training twice a week (as recommended by the American Heart Association). This resistance training--such as weightlifting, bodyweight exercises, or using resistance bands, adapted to the patient's ability--will help prevent muscle loss.  Follow up in 1-2 days regarding coverage of Wegovy  . If therapy is initiated, phone follow-ups will be conducted every 4 weeks for dose titration until the patient reaches the effective therapeutic dose and target weight.  Spencer Cardinal D Tariyah Pendry, Pharm.JONETTA SARAN, CPP [facility] of [facility] [address] [place], [place] [address]  Phone: [phone]; Fax: [phone]

## 2023-09-14 ENCOUNTER — Telehealth: Payer: Self-pay | Admitting: Pharmacy Technician

## 2023-09-14 ENCOUNTER — Other Ambulatory Visit (HOSPITAL_COMMUNITY): Payer: Self-pay

## 2023-09-14 ENCOUNTER — Ambulatory Visit: Payer: Self-pay | Admitting: Cardiology

## 2023-09-14 MED ORDER — WEGOVY 0.25 MG/0.5ML ~~LOC~~ SOAJ
0.2500 mg | SUBCUTANEOUS | 0 refills | Status: DC
Start: 2023-09-14 — End: 2023-10-06

## 2023-09-14 NOTE — Addendum Note (Signed)
 Addended by: Manisha Cancel D on: 09/14/2023 04:39 PM   Modules accepted: Orders

## 2023-09-14 NOTE — Telephone Encounter (Signed)
 In cc'd charts   Pharmacy Patient Advocate Encounter   Received notification from cc chart's that prior authorization for wegovy  0.25mg  is required/requested.   Insurance verification completed.   The patient is insured through [employer] .   Per test claim: PA required; PA submitted to above mentioned insurance via latent Key/confirmation #/EOC [id number] Status is pending

## 2023-09-14 NOTE — Telephone Encounter (Signed)
 Pharmacy Patient Advocate Encounter  Received notification from [employer] that Prior Authorization for wegovy  0.25mg  has been APPROVED from 09/14/23 to 02/15/24. Ran test claim, Copay is $0.00. This test claim was processed through [facility]- copay amounts may vary at other pharmacies due to pharmacy/plan contracts, or as the patient moves through the different stages of their insurance plan.

## 2023-09-14 NOTE — Telephone Encounter (Signed)
 Patient made aware of approval.  Rx sent to [facility] in [place] per his request.  Will follow-up in 4 weeks.

## 2023-09-15 ENCOUNTER — Telehealth: Payer: Self-pay | Admitting: Cardiology

## 2023-09-15 NOTE — Telephone Encounter (Signed)
*  STAT* If patient is at the pharmacy, call can be transferred to refill team.   1. Which medications need to be refilled? (please list name of each medication and dose if known)   torsemi de (DEMADEX ) 20 MG tablet Take 2 tablets (40 mg total) by mouth daily.     4. Which pharmacy/location (including street and city if local pharmacy) is medication to be sent to? [id number] - [place], [place] - [address]     5. Do they need a 30 day or 90 day supply? 90

## 2023-09-20 NOTE — Telephone Encounter (Signed)
 Pt calling back stating he has not received medication yet.     torsemi de (DEMADEX ) 20 MG tablet Take 2 tablets (40 mg total) by mouth daily.

## 2023-09-21 MED ORDER — TORSEMIDE 20 MG PO TABS: 40.0000 mg | ORAL_TABLET | Freq: Every day | ORAL | 6 refills | Status: AC

## 2023-09-21 NOTE — Addendum Note (Signed)
 Addended by: MEMORY DELON POUR on: 09/21/2023 02:59 PM   Modules accepted: Orders

## 2023-09-29 ENCOUNTER — Other Ambulatory Visit: Payer: Self-pay | Admitting: Student

## 2023-09-29 DIAGNOSIS — I480 Paroxysmal atrial fibrillation: Secondary | ICD-10-CM

## 2023-10-05 ENCOUNTER — Other Ambulatory Visit: Payer: Self-pay | Admitting: Cardiology

## 2023-10-05 DIAGNOSIS — I480 Paroxysmal atrial fibrillation: Secondary | ICD-10-CM

## 2023-10-06 ENCOUNTER — Telehealth: Payer: Self-pay | Admitting: Pharmacist

## 2023-10-06 MED ORDER — WEGOVY 0.5 MG/0.5ML ~~LOC~~ SOAJ: 0.5000 mg | SUBCUTANEOUS | 0 refills | Status: DC

## 2023-10-06 NOTE — Telephone Encounter (Signed)
 Patient called stating he will get his last Wegovy  shot today or tomorrow.  He is doing well would like to increase the dose.  Wegovy  0.5 mg weekly was sent to [facility] in [place] per patient request
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
## 2023-10-20 ENCOUNTER — Ambulatory Visit (HOSPITAL_COMMUNITY)
Admission: RE | Admit: 2023-10-20 | Discharge: 2023-10-20 | Disposition: A | Discharge status: Home / Self Care | Source: Ambulatory Visit | Attending: Cardiology | Admitting: Cardiology

## 2023-10-20 ENCOUNTER — Encounter (HOSPITAL_COMMUNITY): Payer: Self-pay | Admitting: Cardiology

## 2023-10-20 ENCOUNTER — Ambulatory Visit (HOSPITAL_BASED_OUTPATIENT_CLINIC_OR_DEPARTMENT_OTHER)
Admission: RE | Admit: 2023-10-20 | Discharge: 2023-10-20 | Disposition: A | Discharge status: Home / Self Care | Source: Ambulatory Visit | Attending: Cardiology | Admitting: Cardiology

## 2023-10-20 VITALS — BP 98/50 | HR 85 | Ht 66.0 in | Wt 236.6 lb

## 2023-10-20 DIAGNOSIS — Z87891 Personal history of nicotine dependence: Secondary | ICD-10-CM | POA: Insufficient documentation

## 2023-10-20 DIAGNOSIS — Z6838 Body mass index (BMI) 38.0-38.9, adult: Secondary | ICD-10-CM | POA: Insufficient documentation

## 2023-10-20 DIAGNOSIS — Z79899 Other long term (current) drug therapy: Secondary | ICD-10-CM | POA: Diagnosis not present

## 2023-10-20 DIAGNOSIS — I3481 Nonrheumatic mitral (valve) annulus calcification: Secondary | ICD-10-CM | POA: Diagnosis not present

## 2023-10-20 DIAGNOSIS — Z7901 Long term (current) use of anticoagulants: Secondary | ICD-10-CM | POA: Insufficient documentation

## 2023-10-20 DIAGNOSIS — I4892 Unspecified atrial flutter: Secondary | ICD-10-CM | POA: Insufficient documentation

## 2023-10-20 DIAGNOSIS — I442 Atrioventricular block, complete: Secondary | ICD-10-CM | POA: Insufficient documentation

## 2023-10-20 DIAGNOSIS — J449 Chronic obstructive pulmonary disease, unspecified: Secondary | ICD-10-CM | POA: Insufficient documentation

## 2023-10-20 DIAGNOSIS — I252 Old myocardial infarction: Secondary | ICD-10-CM | POA: Insufficient documentation

## 2023-10-20 DIAGNOSIS — G4733 Obstructive sleep apnea (adult) (pediatric): Secondary | ICD-10-CM | POA: Diagnosis not present

## 2023-10-20 DIAGNOSIS — Z950 Presence of cardiac pacemaker: Secondary | ICD-10-CM | POA: Insufficient documentation

## 2023-10-20 DIAGNOSIS — E785 Hyperlipidemia, unspecified: Secondary | ICD-10-CM | POA: Insufficient documentation

## 2023-10-20 DIAGNOSIS — I358 Other nonrheumatic aortic valve disorders: Secondary | ICD-10-CM | POA: Diagnosis not present

## 2023-10-20 DIAGNOSIS — I2510 Atherosclerotic heart disease of native coronary artery without angina pectoris: Secondary | ICD-10-CM | POA: Insufficient documentation

## 2023-10-20 DIAGNOSIS — I255 Ischemic cardiomyopathy: Secondary | ICD-10-CM | POA: Diagnosis not present

## 2023-10-20 DIAGNOSIS — I5042 Chronic combined systolic (congestive) and diastolic (congestive) heart failure: Secondary | ICD-10-CM | POA: Diagnosis not present

## 2023-10-20 DIAGNOSIS — I480 Paroxysmal atrial fibrillation: Secondary | ICD-10-CM | POA: Insufficient documentation

## 2023-10-20 DIAGNOSIS — I5032 Chronic diastolic (congestive) heart failure: Secondary | ICD-10-CM | POA: Diagnosis present

## 2023-10-20 DIAGNOSIS — E669 Obesity, unspecified: Secondary | ICD-10-CM | POA: Diagnosis not present

## 2023-10-20 DIAGNOSIS — Z955 Presence of coronary angioplasty implant and graft: Secondary | ICD-10-CM | POA: Diagnosis not present

## 2023-10-20 DIAGNOSIS — R0609 Other forms of dyspnea: Secondary | ICD-10-CM | POA: Diagnosis not present

## 2023-10-20 DIAGNOSIS — I110 Hypertensive heart disease with heart failure: Secondary | ICD-10-CM | POA: Insufficient documentation

## 2023-10-20 LAB — BASIC METABOLIC PANEL WITH GFR
Anion gap: 9 (ref 5–15)
BUN: 20 mg/dL (ref 8–23)
CO2: 29 mmol/L (ref 22–32)
Calcium: 9.1 mg/dL (ref 8.9–10.3)
Chloride: 104 mmol/L (ref 98–111)
Creatinine, Ser: 1.18 mg/dL (ref 0.61–1.24)
GFR, Estimated: >60 mL/min (ref >60)
Glucose, Bld: 106 mg/dL — ABNORMAL HIGH (ref 70–99)
Potassium: 3.3 mmol/L — ABNORMAL LOW (ref 3.5–5.1)
Sodium: 142 mmol/L (ref 135–145)

## 2023-10-20 LAB — ECHOCARDIOGRAM COMPLETE
Area-P 1/2: 3.91 cm2
S' Lateral: 3.8 cm

## 2023-10-20 LAB — BRAIN NATRIURETIC PEPTIDE: B Natriuretic Peptide: 74.8 pg/mL (ref 0.0–100.0)

## 2023-10-20 NOTE — Progress Notes (Signed)
  Echocardiogram 2D Echocardiogram has been performed.  Koleen KANDICE Popper, RDCS 10/20/2023, 2:24 PM

## 2023-10-20 NOTE — Patient Instructions (Signed)
 Medication Changes:  No Changes In Medications at this time.   Lab Work:  Labs done today, your results will be available in MyChart, we will contact you for abnormal readings.  Follow-Up in: with general cardiology (Dr. Alvan)   At the [facility], you and your health needs are our priority. We have a designated team specialized in the treatment of Heart Failure. This Care Team includes your primary Heart Failure Specialized Cardiologist (physician), Advanced Practice Providers (APPs- Physician Assistants and Nurse Practitioners), and Pharmacist who all work together to provide you with the care you need, when you need it.   You may see any of the following providers on your designated Care Team at your next follow up:  Dr. Toribio Fuel Dr. Ezra Shuck Dr. Ria Commander Dr. Odis Brownie Greig Mosses, NP Caffie Shed, [place] [facility] [place], [place] Beckey Coe, NP [place] Lee, NP Tinnie Redman, PharmD   Please be sure to bring in all your medications bottles to every appointment.   Need to Contact Us :  If you have any questions or concerns before your next appointment please send us  a message through [place] or call our office at [phone].    TO LEAVE A MESSAGE FOR THE NURSE SELECT OPTION 2, PLEASE LEAVE A MESSAGE INCLUDING: YOUR NAME DATE OF BIRTH CALL BACK NUMBER REASON FOR CALL**this is important as we prioritize the call backs  YOU WILL RECEIVE A CALL BACK THE SAME DAY AS LONG AS YOU CALL BEFORE 4:00 PM

## 2023-11-02 ENCOUNTER — Other Ambulatory Visit: Payer: Self-pay | Admitting: Physical Medicine and Rehabilitation

## 2023-11-02 DIAGNOSIS — M5416 Radiculopathy, lumbar region: Secondary | ICD-10-CM

## 2023-11-04 ENCOUNTER — Telehealth: Payer: Self-pay | Admitting: Pharmacist

## 2023-11-04 MED ORDER — WEGOVY 1 MG/0.5ML ~~LOC~~ SOAJ: 1.0000 mg | SUBCUTANEOUS | 0 refills | Status: DC

## 2023-11-04 NOTE — Telephone Encounter (Signed)
 Patient called to let me know he took his last dose of wegovy  0.5mg . having no side effects. Would like to increase. Rx for 1mg  of Wegovy  sent to [facility] in [place]

## 2023-11-15 NOTE — Discharge Instructions (Signed)

## 2023-11-17 ENCOUNTER — Other Ambulatory Visit: Payer: Self-pay | Admitting: Cardiology

## 2023-11-17 ENCOUNTER — Ambulatory Visit
Admission: RE | Admit: 2023-11-17 | Discharge: 2023-11-17 | Disposition: A | Discharge status: Home / Self Care | Source: Ambulatory Visit | Attending: Physical Medicine and Rehabilitation | Admitting: Physical Medicine and Rehabilitation

## 2023-11-17 DIAGNOSIS — M5416 Radiculopathy, lumbar region: Secondary | ICD-10-CM

## 2023-11-17 MED ORDER — DIAZEPAM 5 MG PO TABS: 5.0000 mg | ORAL_TABLET | Freq: Once | ORAL | Status: DC

## 2023-11-17 MED ORDER — MEPERIDINE HCL 50 MG/ML IJ SOLN: 50.0000 mg | Freq: Once | INTRAMUSCULAR | Status: DC | PRN

## 2023-11-17 MED ORDER — ONDANSETRON HCL 4 MG/2ML IJ SOLN: 4.0000 mg | Freq: Once | INTRAMUSCULAR | Status: DC | PRN

## 2023-11-21 NOTE — Progress Notes (Signed)
 Remote PPM Transmission

## 2023-11-29 ENCOUNTER — Telehealth: Payer: Self-pay

## 2023-11-29 NOTE — Progress Notes (Signed)
 See telephone note  Pt thought he was having MRI tomorrow, RN clarified that he was having Myelogram, no questions asked, pt stated he was ok to go on with procedure

## 2023-11-29 NOTE — Discharge Instructions (Signed)
 Myelogram Discharge Instructions  Go home and rest quietly as needed. You may resume normal activities; however, do not exert yourself strongly or do any heavy lifting today and tomorrow.   DO NOT drive today.    You may resume your normal diet and medications unless otherwise indicated. Drink lots of extra fluids today and tomorrow.   The incidence of headache, nausea, or vomiting is about 5% (one in 20 patients).  If you develop a headache, lie flat for 24 hours and drink plenty of fluids until the headache goes away.  Caffeinated beverages may be helpful. If when you get up you still have a headache when standing, go back to bed and force fluids for another 24 hours.   If you develop severe nausea and vomiting or a headache that does not go away with the flat bedrest after 48 hours, please call [phone].   Call your physician for a follow-up appointment.  The results of your myelogram will be sent directly to your physician by the following day.  If you have any questions or if complications develop after you arrive home, please call [phone].  Discharge instructions have been explained to the patient.  The patient, or the person responsible for the patient, fully understands these instructions.   Thank you for visiting our office today.   May Restart Eliquis  24 Hours Post Procedure

## 2023-11-30 ENCOUNTER — Ambulatory Visit
Admission: RE | Admit: 2023-11-30 | Discharge: 2023-11-30 | Disposition: A | Discharge status: Home / Self Care | Source: Ambulatory Visit | Attending: Physical Medicine and Rehabilitation | Admitting: Physical Medicine and Rehabilitation

## 2023-11-30 MED ORDER — MEPERIDINE HCL 50 MG/ML IJ SOLN: 50.0000 mg | Freq: Once | INTRAMUSCULAR | Status: DC | PRN

## 2023-11-30 MED ORDER — ONDANSETRON HCL 4 MG/2ML IJ SOLN: 4.0000 mg | Freq: Once | INTRAMUSCULAR | Status: DC | PRN

## 2023-11-30 MED ORDER — DIAZEPAM 5 MG PO TABS: 5.0000 mg | ORAL_TABLET | Freq: Once | ORAL | Status: DC

## 2023-11-30 MED ORDER — IOPAMIDOL (ISOVUE-M 200) INJECTION 41%
20.0000 mL | Freq: Once | INTRAMUSCULAR | Status: AC
  Administered 2023-11-30: 20 mL via INTRATHECAL

## 2023-12-05 ENCOUNTER — Ambulatory Visit: Payer: PPO

## 2023-12-05 DIAGNOSIS — I442 Atrioventricular block, complete: Secondary | ICD-10-CM | POA: Diagnosis not present

## 2023-12-06 ENCOUNTER — Telehealth: Payer: Self-pay | Admitting: Pharmacist

## 2023-12-06 LAB — CUP PACEART REMOTE DEVICE CHECK
Battery Remaining Longevity: 110 mo
Battery Voltage: 3.01 V
Brady Statistic AP VP Percent: 2.63 %
Brady Statistic AP VS Percent: 0.01 %
Brady Statistic AS VP Percent: 97.06 %
Brady Statistic AS VS Percent: 0.3 %
Brady Statistic RA Percent Paced: 2.61 %
Brady Statistic RV Percent Paced: 99.69 %
Date Time Interrogation Session: 20251019202408
Implantable Lead Connection Status: 753985
Implantable Lead Connection Status: 753985
Implantable Lead Implant Date: 20221014
Implantable Lead Implant Date: 20221014
Implantable Lead Location: 753859
Implantable Lead Location: 753860
Implantable Lead Model: 3830
Implantable Lead Model: 5076
Implantable Pulse Generator Implant Date: 20221014
Lead Channel Impedance Value: 285 Ohm
Lead Channel Impedance Value: 323 Ohm
Lead Channel Impedance Value: 399 Ohm
Lead Channel Impedance Value: 418 Ohm
Lead Channel Pacing Threshold Amplitude: 0.75 V
Lead Channel Pacing Threshold Amplitude: 1 V
Lead Channel Pacing Threshold Pulse Width: 0.4 ms
Lead Channel Pacing Threshold Pulse Width: 0.4 ms
Lead Channel Sensing Intrinsic Amplitude: 18.375 mV
Lead Channel Sensing Intrinsic Amplitude: 18.375 mV
Lead Channel Sensing Intrinsic Amplitude: 5.125 mV
Lead Channel Sensing Intrinsic Amplitude: 5.125 mV
Lead Channel Setting Pacing Amplitude: 1.5 V
Lead Channel Setting Pacing Amplitude: 2 V
Lead Channel Setting Pacing Pulse Width: 0.4 ms
Lead Channel Setting Sensing Sensitivity: 1.2 mV
Zone Setting Status: 755011
Zone Setting Status: 755011

## 2023-12-06 MED ORDER — WEGOVY 1.7 MG/0.75ML ~~LOC~~ SOAJ: 1.7000 mg | SUBCUTANEOUS | 0 refills | Status: DC

## 2023-12-06 NOTE — Telephone Encounter (Signed)
 Tolerates Wegovy  1 mg dose well ready to move on to the next dose. Due to back problem can't do lot of exercise. So far lost 23 lbs

## 2023-12-07 ENCOUNTER — Ambulatory Visit: Payer: Self-pay | Admitting: Cardiology

## 2023-12-09 NOTE — Progress Notes (Signed)
 Remote PPM Transmission

## 2023-12-29 ENCOUNTER — Other Ambulatory Visit: Payer: Self-pay | Admitting: Cardiology

## 2023-12-29 DIAGNOSIS — I480 Paroxysmal atrial fibrillation: Secondary | ICD-10-CM

## 2023-12-30 MED ORDER — METOPROLOL SUCCINATE ER 25 MG PO TB24: 25.0000 mg | ORAL_TABLET | Freq: Every day | ORAL | 1 refills | Status: AC

## 2024-01-02 ENCOUNTER — Telehealth: Payer: Self-pay | Admitting: Pharmacist

## 2024-01-02 DIAGNOSIS — I719 Aortic aneurysm of unspecified site, without rupture: Secondary | ICD-10-CM | POA: Insufficient documentation

## 2024-01-02 DIAGNOSIS — I714 Abdominal aortic aneurysm, without rupture, unspecified: Secondary | ICD-10-CM | POA: Insufficient documentation

## 2024-01-02 DIAGNOSIS — I5043 Acute on chronic combined systolic (congestive) and diastolic (congestive) heart failure: Secondary | ICD-10-CM | POA: Insufficient documentation

## 2024-01-02 DIAGNOSIS — I700 Atherosclerosis of aorta: Secondary | ICD-10-CM | POA: Insufficient documentation

## 2024-01-02 MED ORDER — WEGOVY 2.4 MG/0.75ML ~~LOC~~ SOAJ: 2.4000 mg | SUBCUTANEOUS | 11 refills | Status: AC

## 2024-01-02 MED ORDER — APIXABAN 5 MG PO TABS: 5.0000 mg | ORAL_TABLET | Freq: Two times a day (BID) | ORAL | 1 refills | Status: AC

## 2024-01-02 NOTE — Telephone Encounter (Signed)
 Prescription refill request for Eliquis  received. Indication: a fib Last office visit: 10/20/23 Scr: 1.18 epic 10/20/23 Age: 78 Weight: 107kg

## 2024-01-02 NOTE — Telephone Encounter (Signed)
 Returned patient VM. He is ready to go up to Wegovy  2.4mg . Rx sent to [facility] in [place]

## 2024-01-19 ENCOUNTER — Encounter: Payer: Self-pay | Admitting: Cardiology

## 2024-01-19 ENCOUNTER — Ambulatory Visit: Attending: Cardiology | Admitting: Cardiology

## 2024-01-19 VITALS — BP 100/52 | HR 94 | Ht 66.0 in | Wt 224.0 lb

## 2024-01-19 DIAGNOSIS — G4733 Obstructive sleep apnea (adult) (pediatric): Secondary | ICD-10-CM

## 2024-01-19 DIAGNOSIS — Z8679 Personal history of other diseases of the circulatory system: Secondary | ICD-10-CM

## 2024-01-19 DIAGNOSIS — I5020 Unspecified systolic (congestive) heart failure: Secondary | ICD-10-CM

## 2024-01-19 DIAGNOSIS — R0602 Shortness of breath: Secondary | ICD-10-CM | POA: Diagnosis not present

## 2024-01-19 DIAGNOSIS — I480 Paroxysmal atrial fibrillation: Secondary | ICD-10-CM

## 2024-01-19 DIAGNOSIS — E785 Hyperlipidemia, unspecified: Secondary | ICD-10-CM

## 2024-01-19 DIAGNOSIS — I2510 Atherosclerotic heart disease of native coronary artery without angina pectoris: Secondary | ICD-10-CM

## 2024-01-19 NOTE — Patient Instructions (Signed)
 Medication Instructions:  Your physician recommends that you continue on your current medications as directed. Please refer to the Current Medication list given to you today.   Labwork: Fasting Lipid Panel to be completed at [facility] [place]  Testing/Procedures: None  Follow-Up: Your physician recommends that you schedule a follow-up appointment in: 3 months  Any Other Special Instructions Will Be Listed Below (If Applicable). Referral to Vascular Surgery and Pulmonary  If you need a refill on your cardiac medications before your next appointment, please call your pharmacy.
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
## 2024-01-20 ENCOUNTER — Ambulatory Visit: Admitting: Cardiology

## 2024-02-11 ENCOUNTER — Other Ambulatory Visit: Payer: Self-pay | Admitting: Cardiology

## 2024-02-17 ENCOUNTER — Telehealth (HOSPITAL_BASED_OUTPATIENT_CLINIC_OR_DEPARTMENT_OTHER): Payer: Self-pay | Admitting: *Deleted

## 2024-02-17 NOTE — Telephone Encounter (Signed)
 Pharmacy please advise on holding Eliquis  prior to L4-L5 NON-SEGMENTAL POSTERIOR TLIF  scheduled for 03/19/2024. Thank you.   Last labs: 03/24/2023

## 2024-02-17 NOTE — Telephone Encounter (Signed)
 Dr. Alvan, you recently saw this pt in clinic. Are you able to comment on surgical clearance for upcoming L4-L5 NON-SEGMENTAL POSTERIOR TLIF  scheduled for 03/19/2024? Please route your response to P CV DIV PREOP. Thank you!

## 2024-02-17 NOTE — Telephone Encounter (Signed)
"  ° °  Patient Name: Jonathan Garner.  DOB: [birth year] MRN: [id number]  Primary Cardiologist: Alvan Carrier, MD  Chart reviewed as part of pre-operative protocol coverage. Given past medical history and time since last visit, based on ACC/AHA guidelines, Jonathan W Antilla Jr. is at acceptable risk for the planned procedure without further cardiovascular testing.   Per Dr. Alvan:  Jonathan Garner to proceed with surgery and to hold eliquis  3 days prior and plavix  5 days prior, resume both day after if ok from surgery standpoint.  I will route this recommendation to the requesting party via Epic fax function and remove from pre-op pool.  Please call with questions.  Jestin Burbach E Mandell Pangborn, NP 02/17/2024, 3:55 PM  "

## 2024-02-17 NOTE — Telephone Encounter (Signed)
"  ° °  Pre-operative Risk Assessment    Patient Name: Eligha Kmetz.  DOB: 07-12-1945 MRN: [id number]   Date of last office visit: 01/19/24 DR. BRANCH Date of next office visit: 04/13/24 DR. BRANCH   Request for Surgical Clearance    Procedure:  L4-L5 NON-SEGMENTAL POSTERIOR TLIF   Date of Surgery:  Clearance 03/19/24                                Surgeon: DR. SMITH Surgeon's Group or Practice Name:  [facility] [place] Phone number:  [phone] Fax number:  [phone]   Type of Clearance Requested:   - Medical  - Pharmacy:  Hold Clopidogrel  (Plavix ) and Apixaban  (Eliquis )     Type of Anesthesia:  General    Additional requests/questions:    Bonney Niels Jest   02/17/2024, 12:00 PM   "

## 2024-02-22 ENCOUNTER — Telehealth: Payer: Self-pay | Admitting: Pharmacist

## 2024-02-22 NOTE — Telephone Encounter (Signed)
 Patient called stating his wegovy  was >600. Last year it was 0. I advised that he may have hit the OOP max last year prior to starting. Advised that this year OOP is 2100 and then all his medications are 0. He can call his insurance to confirm.

## 2024-03-05 ENCOUNTER — Ambulatory Visit: Payer: PPO

## 2024-03-05 DIAGNOSIS — I480 Paroxysmal atrial fibrillation: Secondary | ICD-10-CM | POA: Diagnosis not present

## 2024-03-07 LAB — CUP PACEART REMOTE DEVICE CHECK
Battery Remaining Longevity: 106 mo
Battery Voltage: 3.01 V
Brady Statistic AP VP Percent: 4.47 %
Brady Statistic AP VS Percent: 0.01 %
Brady Statistic AS VP Percent: 95.14 %
Brady Statistic AS VS Percent: 0.37 %
Brady Statistic RA Percent Paced: 4.42 %
Brady Statistic RV Percent Paced: 99.61 %
Date Time Interrogation Session: 20260118202347
Implantable Lead Connection Status: 753985
Implantable Lead Connection Status: 753985
Implantable Lead Implant Date: 20221014
Implantable Lead Implant Date: 20221014
Implantable Lead Location: 753859
Implantable Lead Location: 753860
Implantable Lead Model: 3830
Implantable Lead Model: 5076
Implantable Pulse Generator Implant Date: 20221014
Lead Channel Impedance Value: 285 Ohm
Lead Channel Impedance Value: 304 Ohm
Lead Channel Impedance Value: 399 Ohm
Lead Channel Impedance Value: 437 Ohm
Lead Channel Pacing Threshold Amplitude: 0.75 V
Lead Channel Pacing Threshold Amplitude: 0.875 V
Lead Channel Pacing Threshold Pulse Width: 0.4 ms
Lead Channel Pacing Threshold Pulse Width: 0.4 ms
Lead Channel Sensing Intrinsic Amplitude: 18.25 mV
Lead Channel Sensing Intrinsic Amplitude: 18.25 mV
Lead Channel Sensing Intrinsic Amplitude: 5 mV
Lead Channel Sensing Intrinsic Amplitude: 5 mV
Lead Channel Setting Pacing Amplitude: 1.5 V
Lead Channel Setting Pacing Amplitude: 2 V
Lead Channel Setting Pacing Pulse Width: 0.4 ms
Lead Channel Setting Sensing Sensitivity: 1.2 mV
Zone Setting Status: 755011
Zone Setting Status: 755011

## 2024-03-08 ENCOUNTER — Ambulatory Visit: Payer: Self-pay | Admitting: Cardiology

## 2024-03-09 NOTE — Progress Notes (Signed)
 Remote PPM Transmission

## 2024-04-13 ENCOUNTER — Ambulatory Visit: Admitting: Cardiology
